# Patient Record
Sex: Female | Born: 1985 | Race: White | Hispanic: No | Marital: Married | Smoking: Never smoker
Health system: Southern US, Community
[De-identification: ages and names within clinical notes are randomized; demographics above are authoritative.]

## PROBLEM LIST (undated history)

## (undated) ENCOUNTER — Inpatient Hospital Stay (HOSPITAL_COMMUNITY): Payer: Self-pay

## (undated) DIAGNOSIS — E282 Polycystic ovarian syndrome: Secondary | ICD-10-CM

## (undated) DIAGNOSIS — E669 Obesity, unspecified: Secondary | ICD-10-CM

## (undated) DIAGNOSIS — N939 Abnormal uterine and vaginal bleeding, unspecified: Secondary | ICD-10-CM

## (undated) DIAGNOSIS — N200 Calculus of kidney: Secondary | ICD-10-CM

## (undated) DIAGNOSIS — O24419 Gestational diabetes mellitus in pregnancy, unspecified control: Secondary | ICD-10-CM

## (undated) DIAGNOSIS — O26899 Other specified pregnancy related conditions, unspecified trimester: Principal | ICD-10-CM

## (undated) DIAGNOSIS — Z87442 Personal history of urinary calculi: Secondary | ICD-10-CM

## (undated) DIAGNOSIS — E079 Disorder of thyroid, unspecified: Secondary | ICD-10-CM

## (undated) DIAGNOSIS — R109 Unspecified abdominal pain: Principal | ICD-10-CM

## (undated) DIAGNOSIS — E039 Hypothyroidism, unspecified: Secondary | ICD-10-CM

## (undated) HISTORY — PX: ADENOIDECTOMY: SUR15

## (undated) HISTORY — PX: DILATION AND CURETTAGE OF UTERUS: SHX78

## (undated) HISTORY — PX: MYRINGOTOMY: SUR874

## (undated) HISTORY — PX: TYMPANOSTOMY TUBE PLACEMENT: SHX32

## (undated) HISTORY — PX: WISDOM TOOTH EXTRACTION: SHX21

## (undated) HISTORY — PX: TONSILLECTOMY: SUR1361

---

## 2005-07-26 ENCOUNTER — Emergency Department (HOSPITAL_COMMUNITY): Admission: EM | Admit: 2005-07-26 | Discharge: 2005-07-26 | Payer: Self-pay | Admitting: *Deleted

## 2005-07-28 ENCOUNTER — Emergency Department (HOSPITAL_COMMUNITY): Admission: EM | Admit: 2005-07-28 | Discharge: 2005-07-28 | Payer: Self-pay | Admitting: Emergency Medicine

## 2006-04-24 ENCOUNTER — Emergency Department (HOSPITAL_COMMUNITY): Admission: EM | Admit: 2006-04-24 | Discharge: 2006-04-24 | Payer: Self-pay | Admitting: Emergency Medicine

## 2006-12-02 ENCOUNTER — Emergency Department (HOSPITAL_COMMUNITY): Admission: EM | Admit: 2006-12-02 | Discharge: 2006-12-02 | Payer: Self-pay | Admitting: Emergency Medicine

## 2008-03-26 ENCOUNTER — Emergency Department (HOSPITAL_COMMUNITY): Admission: EM | Admit: 2008-03-26 | Discharge: 2008-03-27 | Payer: Self-pay | Admitting: Emergency Medicine

## 2008-11-12 ENCOUNTER — Emergency Department (HOSPITAL_COMMUNITY): Admission: EM | Admit: 2008-11-12 | Discharge: 2008-11-12 | Payer: Self-pay | Admitting: Emergency Medicine

## 2010-04-01 ENCOUNTER — Emergency Department (HOSPITAL_COMMUNITY)
Admission: EM | Admit: 2010-04-01 | Discharge: 2010-04-01 | Payer: Self-pay | Source: Home / Self Care | Admitting: Family Medicine

## 2010-10-26 ENCOUNTER — Emergency Department (HOSPITAL_BASED_OUTPATIENT_CLINIC_OR_DEPARTMENT_OTHER)
Admission: EM | Admit: 2010-10-26 | Discharge: 2010-10-26 | Disposition: A | Discharge status: Home / Self Care | Payer: Self-pay | Attending: Emergency Medicine | Admitting: Emergency Medicine

## 2010-10-26 ENCOUNTER — Emergency Department (INDEPENDENT_AMBULATORY_CARE_PROVIDER_SITE_OTHER): Payer: Self-pay

## 2010-10-26 DIAGNOSIS — N201 Calculus of ureter: Secondary | ICD-10-CM | POA: Insufficient documentation

## 2010-10-26 DIAGNOSIS — R109 Unspecified abdominal pain: Secondary | ICD-10-CM

## 2010-10-26 LAB — BASIC METABOLIC PANEL
BUN: 19 mg/dL (ref 6–23)
Calcium: 9.7 mg/dL (ref 8.4–10.5)
GFR calc Af Amer: >60 mL/min (ref >60)
GFR calc non Af Amer: >60 mL/min (ref >60)
Glucose, Bld: 102 mg/dL — ABNORMAL HIGH (ref 70–99)
Potassium: 3.9 mEq/L (ref 3.5–5.1)
Sodium: 139 mEq/L (ref 135–145)

## 2010-10-26 LAB — URINALYSIS, ROUTINE W REFLEX MICROSCOPIC
Glucose, UA: NEGATIVE mg/dL
Ketones, ur: 15 mg/dL — AB
Protein, ur: NEGATIVE mg/dL
Urobilinogen, UA: 0.2 mg/dL (ref 0.0–1.0)

## 2010-10-26 LAB — URINE MICROSCOPIC-ADD ON

## 2011-02-10 LAB — RAPID STREP SCREEN (MED CTR MEBANE ONLY): Streptococcus, Group A Screen (Direct): NEGATIVE

## 2011-02-23 LAB — CBC
Hemoglobin: 13.3
MCHC: 34.4
Platelets: 265
RDW: 13.8

## 2011-02-23 LAB — DIFFERENTIAL
Basophils Absolute: 0
Basophils Relative: 0
Eosinophils Relative: 1
Monocytes Absolute: 0.5
Neutro Abs: 3.6

## 2011-02-23 LAB — URINE MICROSCOPIC-ADD ON

## 2011-02-23 LAB — BASIC METABOLIC PANEL
BUN: 10
CO2: 25
Calcium: 9.4
Glucose, Bld: 87
Sodium: 140

## 2011-02-23 LAB — URINALYSIS, ROUTINE W REFLEX MICROSCOPIC
Hgb urine dipstick: NEGATIVE
Nitrite: NEGATIVE
Protein, ur: NEGATIVE
Specific Gravity, Urine: 1.024
Urobilinogen, UA: 1

## 2011-02-23 LAB — PREGNANCY, URINE: Preg Test, Ur: NEGATIVE

## 2011-06-23 ENCOUNTER — Emergency Department (INDEPENDENT_AMBULATORY_CARE_PROVIDER_SITE_OTHER): Payer: Self-pay

## 2011-06-23 ENCOUNTER — Emergency Department (HOSPITAL_BASED_OUTPATIENT_CLINIC_OR_DEPARTMENT_OTHER)
Admission: EM | Admit: 2011-06-23 | Discharge: 2011-06-24 | Disposition: A | Discharge status: Home / Self Care | Payer: Self-pay | Attending: Emergency Medicine | Admitting: Emergency Medicine

## 2011-06-23 ENCOUNTER — Encounter (HOSPITAL_BASED_OUTPATIENT_CLINIC_OR_DEPARTMENT_OTHER): Payer: Self-pay | Admitting: *Deleted

## 2011-06-23 DIAGNOSIS — R209 Unspecified disturbances of skin sensation: Secondary | ICD-10-CM

## 2011-06-23 DIAGNOSIS — M25569 Pain in unspecified knee: Secondary | ICD-10-CM | POA: Insufficient documentation

## 2011-06-23 DIAGNOSIS — E079 Disorder of thyroid, unspecified: Secondary | ICD-10-CM | POA: Insufficient documentation

## 2011-06-23 DIAGNOSIS — M25561 Pain in right knee: Secondary | ICD-10-CM

## 2011-06-23 HISTORY — DX: Disorder of thyroid, unspecified: E07.9

## 2011-06-23 MED ORDER — OXYCODONE-ACETAMINOPHEN 5-325 MG PO TABS
2.0000 | ORAL_TABLET | Freq: Once | ORAL | Status: AC
  Administered 2011-06-23: 2 via ORAL
  Filled 2011-06-23 (×2): qty 1

## 2011-06-23 MED ORDER — IBUPROFEN 600 MG PO TABS: 600.0000 mg | ORAL_TABLET | Freq: Three times a day (TID) | ORAL | Status: AC | PRN

## 2011-06-23 MED ORDER — OXYCODONE-ACETAMINOPHEN 5-325 MG PO TABS: 1.0000 | ORAL_TABLET | ORAL | Status: AC | PRN

## 2011-06-23 NOTE — ED Notes (Signed)
Pt c/o right knee pain and swelling w/o injury

## 2011-06-23 NOTE — ED Provider Notes (Signed)
History     CSN: [id number]  Arrival date & time 06/23/11  2205   First MD Initiated Contact with Patient 06/23/11 2331      Chief Complaint  Patient presents with  . Knee Pain    The history is provided by the patient.   patient reports several days of constant right knee pain.  She's had no obvious injury to this area.  She does not remember blunt trauma or twisting injury.  She does report that she consumes large amounts of meat however she has never had gout before.  She's never had swelling in her first MTP joint.  She does report that she occasionally gets pain and swelling in her right elbow that resolves with time.  She does report mild pain in the right elbow right now as well however her main concern is her right knee.  She's had not had fever or chills.  She reports pain with range of motion of her right knee.  She denies unilateral leg swelling.  She reports that she feels that her right knee is mildly swollen although this is not appreciable on exam.  Nothing improves her symptoms.  She has tried anti-inflammatories.  Her pain is moderate in severity.  Past Medical History  Diagnosis Date  . Thyroid disease     History reviewed. No pertinent past surgical history.  History reviewed. No pertinent family history.  History  Substance Use Topics  . Smoking status: Never Smoker   . Smokeless tobacco: Not on file  . Alcohol Use: No    OB History    Grav Para Term Preterm Abortions TAB SAB Ect Mult Living                  Review of Systems  All other systems reviewed and are negative.    Allergies  Review of patient's allergies indicates no known allergies.  Home Medications   Current Outpatient Rx  Name Route Sig Dispense Refill  . IBUPROFEN 200 MG PO TABS Oral Take 800 mg by mouth every 6 (six) hours as needed. For pain    . LEVOTHYROXINE SODIUM 125 MCG PO TABS Oral Take 125 mcg by mouth daily.    . IBUPROFEN 600 MG PO TABS Oral Take 1 tablet (600 mg total)  by mouth every 8 (eight) hours as needed for pain. 15 tablet 0  . OXYCODONE-ACETAMINOPHEN 5-325 MG PO TABS Oral Take 1 tablet by mouth every 4 (four) hours as needed for pain. 20 tablet 0    BP 125/84  Pulse 87  Temp(Src) 97.7 F (36.5 C) (Oral)  Resp 16  Ht 5\' 7"  (1.702 m)  Wt 280 lb (127.007 kg)  BMI 43.85 kg/m2  SpO2 100%  LMP 06/15/2011  Physical Exam  Constitutional: She is oriented to person, place, and time. She appears well-developed and well-nourished.  HENT:  Head: Normocephalic.  Eyes: EOM are normal.  Neck: Normal range of motion.  Pulmonary/Chest: Effort normal.  Musculoskeletal: Normal range of motion.       No notable effusion noted in the right knee.  She does have mild pain with range of motion of her right knee however unable to fully range her knee.  Has normal pulses in her right foot.  There is no obvious sensory deficit.  There is no erythema or warmth of her right knee.  Neurological: She is alert and oriented to person, place, and time.  Psychiatric: She has a normal mood and affect.  ED Course  Procedures (including critical care time)  Labs Reviewed - No data to display Dg Knee Complete 4 Views Right  06/23/2011  *RADIOLOGY REPORT*  Clinical Data: Right knee pain for a week.  Now worsening. Numbness in the foot.  No known injury.  RIGHT KNEE - COMPLETE 4+ VIEW  Comparison: None.  Findings: The right knee appears intact.  No evidence of acute fracture or subluxation.  No focal bone lesion or bone destruction. Bone cortex and trabecular architecture appear intact.  No abnormal periosteal reaction.  No significant effusion.  No radiopaque foreign bodies in the soft tissues.  IMPRESSION: No acute bony abnormalities demonstrated.  Original Report Authenticated By: Marlon Pel, M.D.   I personally reviewed the x-ray  1. Pain in right knee       MDM  I suspect this patient's symptoms are secondary to gout.  She has pain in her right knee as well  as her right elbow.  There's no obvious effusion.  I doubt this is a septic arthritis.  Her x-ray is normal.  Her vital signs are normal.  She is able to arrange her right knee.  We discussed at length dietary changes including limiting her meat intake.  Her pain was treated in the ER.  She'll be sent home on a course of pain medicine anti-inflammatories.  She's been given followup with her orthopedic surgeon as well as her primary care Dr.        Lyanne Co, MD 06/24/11 [phone]

## 2011-06-23 NOTE — ED Notes (Signed)
rx x 1 for percocet given to pt- pt informed that ibuprofen has been e-prescribed- d/c home with ride

## 2011-07-16 ENCOUNTER — Ambulatory Visit (INDEPENDENT_AMBULATORY_CARE_PROVIDER_SITE_OTHER): Payer: Self-pay | Admitting: Obstetrics and Gynecology

## 2011-07-16 DIAGNOSIS — Z01419 Encounter for gynecological examination (general) (routine) without abnormal findings: Secondary | ICD-10-CM

## 2011-07-31 ENCOUNTER — Ambulatory Visit: Payer: Self-pay | Admitting: Obstetrics and Gynecology

## 2011-08-13 ENCOUNTER — Other Ambulatory Visit: Payer: Self-pay

## 2011-08-24 ENCOUNTER — Telehealth: Payer: Self-pay | Admitting: Obstetrics and Gynecology

## 2011-08-24 ENCOUNTER — Other Ambulatory Visit: Payer: Self-pay | Admitting: Obstetrics and Gynecology

## 2011-08-24 DIAGNOSIS — E039 Hypothyroidism, unspecified: Secondary | ICD-10-CM

## 2011-08-24 MED ORDER — LEVOTHYROXINE SODIUM 137 MCG PO TABS: 137.0000 ug | ORAL_TABLET | Freq: Every day | ORAL | Status: DC

## 2011-08-24 NOTE — Telephone Encounter (Signed)
Pt had many concerns regarding medications new dose of synthroid was not correct so pt has been taking old dose of 0.125mg  advised pt I would clarify dose with SR, pt is also taking metformin she is currently on 1000mg  needs to be on 2000mg  she is gradually adding more due to the side effects  Will now add 500mg  soon discussed, colpo was needed due to abn pap results she stated she did not want colpo advised I will discuss with SR.Pt did not see Dr Talmage Nap due to upfront payment of $500 to $800 After talking with SR synthroid 0.137 daily times 4 wks and recheck bld work TSH T3 and T4 she does rec the colpo since it had of possible high cell change pt agreeable she  wants to check on pricing before she schedules appt.office will seach for another endocrinologist pt agrees wih plan

## 2011-09-07 ENCOUNTER — Telehealth: Payer: Self-pay | Admitting: Obstetrics and Gynecology

## 2011-09-08 NOTE — Telephone Encounter (Signed)
Routed to laura/aka "Gayla Doss"

## 2011-09-16 ENCOUNTER — Telehealth: Payer: Self-pay | Admitting: Obstetrics and Gynecology

## 2011-09-16 NOTE — Telephone Encounter (Signed)
Pt calling for Clomid refill.  Appt?  ld

## 2011-09-16 NOTE — Telephone Encounter (Signed)
SR pt/Joan Becker

## 2011-09-17 ENCOUNTER — Telehealth: Payer: Self-pay

## 2011-09-17 NOTE — Telephone Encounter (Signed)
Unable to LM. VM not set up.  Pt to have TSH done and the f/u on D 1-2-3 per SR.  ld

## 2011-09-17 NOTE — Telephone Encounter (Signed)
Need more info or appointment

## 2011-09-17 NOTE — Telephone Encounter (Signed)
Pt states that SR would start her on Clomid as soon as she had been on Synthroid for 1 month.  Pt states she has been on med for 1 month now and would like to start Clomid.    Rx or appt?

## 2011-09-18 ENCOUNTER — Ambulatory Visit: Payer: Self-pay | Admitting: Obstetrics and Gynecology

## 2011-09-18 DIAGNOSIS — E039 Hypothyroidism, unspecified: Secondary | ICD-10-CM

## 2011-09-18 LAB — TSH: TSH: 2.869 u[IU]/mL (ref 0.350–4.500)

## 2011-09-23 ENCOUNTER — Encounter: Payer: Self-pay | Admitting: Obstetrics and Gynecology

## 2011-09-23 ENCOUNTER — Ambulatory Visit (INDEPENDENT_AMBULATORY_CARE_PROVIDER_SITE_OTHER): Payer: Self-pay | Admitting: Obstetrics and Gynecology

## 2011-09-23 VITALS — BP 122/68 | Temp 97.9°F | Ht 68.0 in | Wt 293.0 lb

## 2011-09-23 DIAGNOSIS — E079 Disorder of thyroid, unspecified: Secondary | ICD-10-CM

## 2011-09-23 DIAGNOSIS — N970 Female infertility associated with anovulation: Secondary | ICD-10-CM

## 2011-09-23 DIAGNOSIS — E039 Hypothyroidism, unspecified: Secondary | ICD-10-CM | POA: Insufficient documentation

## 2011-09-23 MED ORDER — CLOMIPHENE CITRATE 50 MG PO TABS: 50.0000 mg | ORAL_TABLET | Freq: Every day | ORAL | Status: AC

## 2011-09-23 NOTE — Patient Instructions (Addendum)
Patient needs to be scheduled for a day 12 follicle study ultrasound with follow up with Dr. Estanislado Pandy or another M.D.  Go to [facility] on October 11, 2011 for a day 21 Progesterone lab test only

## 2011-09-23 NOTE — Progress Notes (Signed)
26 YO with history of anovulatory cycles presents for ovary check.  Placed on Synthroid 137 mcg to regulate thyroid.  TSH on 09/18/11 = 2.68.  Menses is regular with flow x  7 days with  pad change every 2 hours-no cramps.  Patient is taking Metformin 500 mg bid and has a previous history of not being ovulatory on Clomid 150 mg.   O: Abdomen: soft, non-tender      Pelvic: EGBUS-wnl, vagina- moderate blood, cervix-no      lesions, uterus-appears normal size, exam limited by     habitus, adnexae-no masses or tenderness  A: Anovulatory Cycles     Ovary Check     Thyroid Disease  P: Dr. Estanislado Pandy consulted: Schedule day 12 ultrasound for           follicle study           Day 21 Progesterone at [facility] 10/11/11           Clomid 50 mg  #20  4 po daily day 3-5 no refills

## 2011-10-02 ENCOUNTER — Ambulatory Visit (INDEPENDENT_AMBULATORY_CARE_PROVIDER_SITE_OTHER): Payer: Self-pay

## 2011-10-02 ENCOUNTER — Ambulatory Visit (INDEPENDENT_AMBULATORY_CARE_PROVIDER_SITE_OTHER): Payer: Self-pay | Admitting: Obstetrics and Gynecology

## 2011-10-02 ENCOUNTER — Other Ambulatory Visit: Payer: Self-pay | Admitting: Obstetrics and Gynecology

## 2011-10-02 ENCOUNTER — Encounter: Payer: Self-pay | Admitting: Obstetrics and Gynecology

## 2011-10-02 DIAGNOSIS — N979 Female infertility, unspecified: Secondary | ICD-10-CM

## 2011-10-02 DIAGNOSIS — E282 Polycystic ovarian syndrome: Secondary | ICD-10-CM

## 2011-10-02 DIAGNOSIS — E039 Hypothyroidism, unspecified: Secondary | ICD-10-CM

## 2011-10-02 DIAGNOSIS — N970 Female infertility associated with anovulation: Secondary | ICD-10-CM

## 2011-10-02 MED ORDER — LEVOTHYROXINE SODIUM 137 MCG PO TABS: 137.0000 ug | ORAL_TABLET | Freq: Every day | ORAL | Status: DC

## 2011-10-02 NOTE — Progress Notes (Signed)
PCOS on Clomid 200 mg now on D12 Follicle study: no dominant follicle   Reviewed with patient. Repeat ultrasound Tuesday 10/06/11 Pt requesting refill on Syntroid

## 2011-10-06 ENCOUNTER — Other Ambulatory Visit: Payer: Self-pay

## 2011-10-06 ENCOUNTER — Encounter: Payer: Self-pay | Admitting: Obstetrics and Gynecology

## 2011-10-08 ENCOUNTER — Other Ambulatory Visit: Payer: Self-pay | Admitting: Obstetrics and Gynecology

## 2011-10-08 DIAGNOSIS — E039 Hypothyroidism, unspecified: Secondary | ICD-10-CM

## 2011-10-08 MED ORDER — LEVOTHYROXINE SODIUM 137 MCG PO TABS: 137.0000 ug | ORAL_TABLET | Freq: Every day | ORAL | Status: DC

## 2011-10-08 NOTE — Telephone Encounter (Signed)
Laura/epic °

## 2011-10-08 NOTE — Telephone Encounter (Signed)
Pt states rx refill was not received at [facility].  Resent to [facility] [address].  Pt to call if not received again.  ld

## 2012-02-29 ENCOUNTER — Telehealth: Payer: Self-pay | Admitting: Obstetrics and Gynecology

## 2012-02-29 NOTE — Telephone Encounter (Signed)
Pt c/o very large clots.  Mostly golfball size, some softball size.  appt made with SR.  ld

## 2012-02-29 NOTE — Telephone Encounter (Signed)
sr pt 

## 2012-03-02 ENCOUNTER — Ambulatory Visit (INDEPENDENT_AMBULATORY_CARE_PROVIDER_SITE_OTHER): Payer: Self-pay | Admitting: Obstetrics and Gynecology

## 2012-03-02 DIAGNOSIS — Z331 Pregnant state, incidental: Secondary | ICD-10-CM

## 2012-03-07 NOTE — Progress Notes (Signed)
Pt DNKA

## 2012-04-04 ENCOUNTER — Encounter (HOSPITAL_COMMUNITY): Payer: Self-pay | Admitting: *Deleted

## 2012-04-04 ENCOUNTER — Inpatient Hospital Stay (HOSPITAL_COMMUNITY)
Admission: AD | Admit: 2012-04-04 | Discharge: 2012-04-04 | Disposition: A | Discharge status: Home / Self Care | Payer: Self-pay | Source: Ambulatory Visit | Attending: Obstetrics & Gynecology | Admitting: Obstetrics & Gynecology

## 2012-04-04 ENCOUNTER — Inpatient Hospital Stay (HOSPITAL_COMMUNITY): Payer: Self-pay

## 2012-04-04 DIAGNOSIS — D649 Anemia, unspecified: Secondary | ICD-10-CM | POA: Insufficient documentation

## 2012-04-04 DIAGNOSIS — R109 Unspecified abdominal pain: Secondary | ICD-10-CM | POA: Insufficient documentation

## 2012-04-04 DIAGNOSIS — N949 Unspecified condition associated with female genital organs and menstrual cycle: Secondary | ICD-10-CM | POA: Insufficient documentation

## 2012-04-04 DIAGNOSIS — N938 Other specified abnormal uterine and vaginal bleeding: Secondary | ICD-10-CM | POA: Insufficient documentation

## 2012-04-04 HISTORY — DX: Obesity, unspecified: E66.9

## 2012-04-04 HISTORY — DX: Hypothyroidism, unspecified: E03.9

## 2012-04-04 LAB — CBC
Platelets: 296 10*3/uL (ref 150–400)
RBC: 4 MIL/uL (ref 3.87–5.11)
RDW: 16.1 % — ABNORMAL HIGH (ref 11.5–15.5)
WBC: 6.5 10*3/uL (ref 4.0–10.5)

## 2012-04-04 LAB — WET PREP, GENITAL
Clue Cells Wet Prep HPF POC: NONE SEEN
WBC, Wet Prep HPF POC: NONE SEEN
Yeast Wet Prep HPF POC: NONE SEEN

## 2012-04-04 MED ORDER — IBUPROFEN 800 MG PO TABS: 800.0000 mg | ORAL_TABLET | Freq: Three times a day (TID) | ORAL | Status: DC | PRN

## 2012-04-04 MED ORDER — FERROUS SULFATE 325 (65 FE) MG PO TABS: 325.0000 mg | ORAL_TABLET | Freq: Three times a day (TID) | ORAL | Status: DC

## 2012-04-04 MED ORDER — MEDROXYPROGESTERONE ACETATE 10 MG PO TABS: 10.0000 mg | ORAL_TABLET | Freq: Every day | ORAL | Status: DC

## 2012-04-04 MED ORDER — KETOROLAC TROMETHAMINE 60 MG/2ML IM SOLN
30.0000 mg | Freq: Once | INTRAMUSCULAR | Status: AC
  Administered 2012-04-04: 30 mg via INTRAMUSCULAR
  Filled 2012-04-04: qty 2

## 2012-04-04 NOTE — MAU Note (Signed)
Pt states she started her period last Wednesday, became heavy on Saturday, passing large clots.  Still bleeding heavily now, has been passing clots in the floor.  LLQ pain since last night.

## 2012-04-04 NOTE — MAU Provider Note (Signed)
Attestation of Attending Supervision of Advanced Practitioner (CNM/NP): Evaluation and management procedures were performed by the Advanced Practitioner under my supervision and collaboration.  I have reviewed the Advanced Practitioner's note and chart, and I agree with the management and plan.  Dodd Schmid, MD, FACOG Attending Obstetrician & Gynecologist Faculty Practice, [facility]  

## 2012-04-04 NOTE — MAU Provider Note (Signed)
History     CSN: [id number]  Arrival date and time: 04/04/12 4098   None     Chief Complaint  Patient presents with  . Vaginal Bleeding  . Abdominal Pain   HPI 26 y.o. G0P0 with heavy bleeding since Saturday night, passing baseball sized clots, lower left side pain since 11 PM last night. Patient's last menstrual period was 03/30/2012. Periods usually heavy/painful, but this is worse. Pain improves with Motrin. Last period was 3 weeks long with smaller clots, no pain. H/O PCOS and Hypothyroid. Was using Clomid for ovulation induction last year, stopped and hasn't been on anything to control menstrual cycles since. Thyroid last checked in January.   Past Medical History  Diagnosis Date  . Thyroid disease   . Hypothyroidism   . Obesity     Past Surgical History  Procedure Date  . Tonsillectomy   . Adenoidectomy   . Wisdom tooth extraction     History reviewed. No pertinent family history.  History  Substance Use Topics  . Smoking status: Never Smoker   . Smokeless tobacco: Never Used  . Alcohol Use: No    Allergies: No Known Allergies  Prescriptions prior to admission  Medication Sig Dispense Refill  . ibuprofen (ADVIL,MOTRIN) 200 MG tablet Take 800 mg by mouth every 6 (six) hours as needed. For pain      . levothyroxine (SYNTHROID, LEVOTHROID) 137 MCG tablet Take 1 tablet (137 mcg total) by mouth daily.  30 tablet  11  . metFORMIN (GLUCOPHAGE) 500 MG tablet Take 500 mg by mouth 2 (two) times daily with a meal.        Review of Systems  Respiratory: Negative.   Cardiovascular: Negative.   Gastrointestinal: Positive for abdominal pain. Negative for nausea, vomiting, diarrhea and constipation.  Genitourinary: Negative for dysuria, urgency, frequency, hematuria and flank pain.       Positive for vaginal bleeding  Musculoskeletal: Negative.   Neurological: Positive for weakness.  Psychiatric/Behavioral: Negative.    Physical Exam   Blood pressure 118/66, pulse  90, temperature 98.2 F (36.8 C), temperature source Oral, resp. rate 18, height 5\' 8"  (1.727 m), weight 308 lb 6.4 oz (139.889 kg), last menstrual period 03/30/2012.  Physical Exam  Nursing note and vitals reviewed. Constitutional: She is oriented to person, place, and time. She appears well-developed and well-nourished. She appears distressed (uncomfortable appearing).       Obese   HENT:  Head: Normocephalic and atraumatic.  Cardiovascular: Normal rate, regular rhythm and normal heart sounds.   Respiratory: Effort normal and breath sounds normal. No respiratory distress.  GI: Soft. Bowel sounds are normal. She exhibits no distension and no mass. There is no tenderness. There is no rebound and no guarding.  Genitourinary: There is no rash or lesion on the right labia. There is no rash or lesion on the left labia. Uterus is not deviated, not enlarged, not fixed and not tender. Cervix exhibits no motion tenderness, no discharge and no friability. Right adnexum displays no mass, no tenderness and no fullness. Left adnexum displays no mass, no tenderness and no fullness. There is bleeding (moderate, large blood on pad and on legs) around the vagina. No erythema or tenderness around the vagina. No vaginal discharge found.  Musculoskeletal: Normal range of motion.  Neurological: She is alert and oriented to person, place, and time. No cranial nerve deficit.  Skin: Skin is warm and dry.  Psychiatric: She has a normal mood and affect.    MAU Course  Procedures Results for orders placed during the hospital encounter of 04/04/12 (from the past 24 hour(s))  POCT PREGNANCY, URINE     Status: Normal   Collection Time   04/04/12  8:08 AM      Component Value Range   Preg Test, Ur NEGATIVE  NEGATIVE  CBC     Status: Abnormal   Collection Time   04/04/12  8:11 AM      Component Value Range   WBC 6.5  4.0 - 10.5 K/uL   RBC 4.00  3.87 - 5.11 MIL/uL   Hemoglobin 8.5 (*) 12.0 - 15.0 g/dL   HCT 16.1  (*) 09.6 - 46.0 %   MCV 71.5 (*) 78.0 - 100.0 fL   MCH 21.3 (*) 26.0 - 34.0 pg   MCHC 29.7 (*) 30.0 - 36.0 g/dL   RDW 04.5 (*) 40.9 - 81.1 %   Platelets 296  150 - 400 K/uL  WET PREP, GENITAL     Status: Normal   Collection Time   04/04/12  8:47 AM      Component Value Range   Yeast Wet Prep HPF POC NONE SEEN  NONE SEEN   Trich, Wet Prep NONE SEEN  NONE SEEN   Clue Cells Wet Prep HPF POC NONE SEEN  NONE SEEN   WBC, Wet Prep HPF POC NONE SEEN  NONE SEEN   US Transvaginal Non-ob  04/04/2012  *RADIOLOGY REPORT*  Clinical Data: Pelvic pain and heavy bleeding.  Patient is 26 years old.  TRANSABDOMINAL AND TRANSVAGINAL ULTRASOUND OF PELVIS Technique:  Both transabdominal and transvaginal ultrasound examinations of the pelvis were performed. Transabdominal technique was performed for global imaging of the pelvis including uterus, ovaries, adnexal regions, and pelvic cul-de-sac.  It was necessary to proceed with endovaginal exam following the transabdominal exam to visualize the .  Comparison:  CT abdomen pelvis 10/26/2010  Findings:  Uterus: Measures 9 x 5.6 x 6 cm and is anteverted.  Uterine echotexture is heterogeneous.  No focal uterine mass is identified. The junctional zone between the endometrium and myometrium is indistinct.  Endometrium: Heterogeneous and thickened.  Endometrial thickness is approximately 2.4 cm.  Right ovary:  Normal appearance/no adnexal mass.  Measures 3 x 2.8 x 3.2 cm.  Left ovary: Normal appearance/no adnexal mass.  Measures 3.3 x 3.2 x 1.8 cm.  Other findings: A small amount of free pelvic fluid is identified.  IMPRESSION:  Thickened endometrium. Measures 2.4 cm.  Per EPIC, the patient has a history of an ovulatory cycles and PCOS.  Question if endometrial thickening could be secondary to anovulatory cycles.  The possibility of endometrial polyp, endometrial hyperplasia, or less likely endometrial carcinoma, given the patient's age, cannot be excluded by imaging.  Consider  short-term follow-up ultrasound immediately following the patient's menses.  Sonohysterogram may be useful if the endometrial thickening persists.   Original Report Authenticated By: Britta Mccreedy, M.D.    US Pelvis Complete  04/04/2012  *RADIOLOGY REPORT*  Clinical Data: Pelvic pain and heavy bleeding.  Patient is 26 years old.  TRANSABDOMINAL AND TRANSVAGINAL ULTRASOUND OF PELVIS Technique:  Both transabdominal and transvaginal ultrasound examinations of the pelvis were performed. Transabdominal technique was performed for global imaging of the pelvis including uterus, ovaries, adnexal regions, and pelvic cul-de-sac.  It was necessary to proceed with endovaginal exam following the transabdominal exam to visualize the .  Comparison:  CT abdomen pelvis 10/26/2010  Findings:  Uterus: Measures 9 x 5.6 x 6 cm and is anteverted.  Uterine  echotexture is heterogeneous.  No focal uterine mass is identified. The junctional zone between the endometrium and myometrium is indistinct.  Endometrium: Heterogeneous and thickened.  Endometrial thickness is approximately 2.4 cm.  Right ovary:  Normal appearance/no adnexal mass.  Measures 3 x 2.8 x 3.2 cm.  Left ovary: Normal appearance/no adnexal mass.  Measures 3.3 x 3.2 x 1.8 cm.  Other findings: A small amount of free pelvic fluid is identified.  IMPRESSION:  Thickened endometrium. Measures 2.4 cm.  Per EPIC, the patient has a history of an ovulatory cycles and PCOS.  Question if endometrial thickening could be secondary to anovulatory cycles.  The possibility of endometrial polyp, endometrial hyperplasia, or less likely endometrial carcinoma, given the patient's age, cannot be excluded by imaging.  Consider short-term follow-up ultrasound immediately following the patient's menses.  Sonohysterogram may be useful if the endometrial thickening persists.   Original Report Authenticated By: Britta Mccreedy, M.D.     Assessment and Plan   1. DUB (dysfunctional uterine bleeding)    2. Anemia       Medication List     As of 04/04/2012 11:43 AM    START taking these medications         ferrous sulfate 325 (65 FE) MG tablet   Take 1 tablet (325 mg total) by mouth 3 (three) times daily with meals.      * ibuprofen 800 MG tablet   Commonly known as: ADVIL,MOTRIN   Take 1 tablet (800 mg total) by mouth every 8 (eight) hours as needed for pain.      medroxyPROGESTERone 10 MG tablet   Commonly known as: PROVERA   Take 1 tablet (10 mg total) by mouth daily.     * Notice: This list has 1 medication(s) that are the same as other medications prescribed for you. Read the directions carefully, and ask your doctor or other care provider to review them with you.    CONTINUE taking these medications         * ibuprofen 200 MG tablet   Commonly known as: ADVIL,MOTRIN      levothyroxine 137 MCG tablet   Commonly known as: SYNTHROID, LEVOTHROID   Take 1 tablet (137 mcg total) by mouth daily.      metFORMIN 500 MG tablet   Commonly known as: GLUCOPHAGE     * Notice: This list has 1 medication(s) that are the same as other medications prescribed for you. Read the directions carefully, and ask your doctor or other care provider to review them with you.        Where to get your medications    These are the prescriptions that you need to pick up. We sent them to a specific pharmacy, so you will need to go there to get them.   [id number] - [place], [place] - [address]    [address] [place] [address]    Phone: [phone]        ferrous sulfate 325 (65 FE) MG tablet   ibuprofen 800 MG tablet   medroxyPROGESTERone 10 MG tablet            Follow-up Information    Follow up with [facility]. (someone will call to schedule appointment)    Contact information:   [address] [place] [address] [phone]           Lavon Bothwell 04/04/2012, 8:20 AM

## 2012-04-11 ENCOUNTER — Other Ambulatory Visit: Payer: Self-pay | Admitting: Advanced Practice Midwife

## 2012-04-11 ENCOUNTER — Telehealth: Payer: Self-pay | Admitting: *Deleted

## 2012-04-11 DIAGNOSIS — E039 Hypothyroidism, unspecified: Secondary | ICD-10-CM

## 2012-04-11 MED ORDER — LEVOTHYROXINE SODIUM 175 MCG PO TABS: 175.0000 ug | ORAL_TABLET | Freq: Every day | ORAL | Status: DC

## 2012-04-11 NOTE — Progress Notes (Signed)
TSH elevated to 9.7, pt currently taking Synthroid 137 mcg, will increase to 175 mcg daily. Has f/u appointment in GYN clinic this month.

## 2012-04-11 NOTE — Telephone Encounter (Signed)
Pt left message requesting thyroid test results.  I returned pt's call and informed her of abnormal TSH result. She states that [name] CNM was going to increase the dose of Levothyroxine if the test was abnormal. I stated that I will contact her or a partner and then call her back. Pt voiced understanding.

## 2012-04-13 NOTE — Telephone Encounter (Signed)
Patient left message stating she was wondering if we called in a new Rx for synthroid and to call her back at [phone]. Called patient back and informed her I received her message asking about a Rx for synthroid and that the medicine had been called in to her [facility] pharmacy on [address] and is available for her to pick up. Patient verbalized understanding and had no further questions

## 2012-05-06 ENCOUNTER — Ambulatory Visit (INDEPENDENT_AMBULATORY_CARE_PROVIDER_SITE_OTHER): Payer: Self-pay | Admitting: Obstetrics & Gynecology

## 2012-05-06 ENCOUNTER — Encounter: Payer: Self-pay | Admitting: Obstetrics & Gynecology

## 2012-05-06 VITALS — BP 118/79 | HR 81 | Temp 97.0°F | Ht 67.0 in | Wt 304.0 lb

## 2012-05-06 DIAGNOSIS — E282 Polycystic ovarian syndrome: Secondary | ICD-10-CM

## 2012-05-06 MED ORDER — NORGESTIM-ETH ESTRAD TRIPHASIC 0.18/0.215/0.25 MG-25 MCG PO TABS: 1.0000 | ORAL_TABLET | Freq: Every day | ORAL | Status: DC

## 2012-05-06 NOTE — Progress Notes (Signed)
Patient ID: Joan Becker, female   DOB: Sep 20, 1985, 26 y.o.   MRN: [id number]  Chief Complaint  Patient presents with  . Menstrual Problem    DUB    HPI Joan Becker is a 26 y.o. female.  Persistent vaginal bleeding since 11/13, was seen in MAU 11/28 and is taking Provera. Only spotting now. 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 Lost insurance and did not go back. HPI  Past Medical History  Diagnosis Date  . Thyroid disease   . Hypothyroidism   . Obesity     Past Surgical History  Procedure Date  . Tonsillectomy   . Adenoidectomy   . Wisdom tooth extraction     Family History  Problem Relation Age of Onset  . Hearing loss Maternal Aunt   . Hearing loss Maternal Uncle   . Hearing loss Paternal Aunt   . Hearing loss Paternal Uncle   . Hearing loss Maternal Grandmother   . Hearing loss Maternal Grandfather   . Hearing loss Paternal Grandmother   . Hearing loss Paternal Grandfather     Social History History  Substance Use Topics  . Smoking status: Never Smoker   . Smokeless tobacco: Never Used  . Alcohol Use: No    No Known Allergies  Current Outpatient Prescriptions  Medication Sig Dispense Refill  . ferrous sulfate 325 (65 FE) MG tablet Take 1 tablet (325 mg total) by mouth 3 (three) times daily with meals.  90 tablet  11  . ibuprofen (ADVIL,MOTRIN) 200 MG tablet Take 800 mg by mouth every 6 (six) hours as needed. For pain      . ibuprofen (ADVIL,MOTRIN) 800 MG tablet Take 1 tablet (800 mg total) by mouth every 8 (eight) hours as needed for pain.  30 tablet  0  . levothyroxine (SYNTHROID, LEVOTHROID) 175 MCG tablet Take 1 tablet (175 mcg total) by mouth daily.  30 tablet  11  . medroxyPROGESTERone (PROVERA) 10 MG tablet Take 1 tablet (10 mg total) by mouth daily.  30 tablet  1  . metFORMIN (GLUCOPHAGE) 500 MG tablet Take 500 mg by mouth 2 (two) times daily with a meal.      . Norgestimate-Ethinyl  Estradiol Triphasic 0.18/0.215/0.25 MG-25 MCG tab Take 1 tablet by mouth daily.  1 Package  11    Review of Systems Review of Systems  Constitutional: Negative for activity change.  Respiratory: Negative for shortness of breath.   Genitourinary: Positive for vaginal bleeding and menstrual problem. Negative for vaginal discharge, pelvic pain and dyspareunia.  Neurological: Negative for weakness and light-headedness.    Blood pressure 118/79, pulse 81, temperature 97 F (36.1 C), temperature source Oral, height 5\' 7"  (1.702 m), weight 304 lb (137.893 kg), last menstrual period 03/28/2012.  Physical Exam Physical Exam  Nursing note and vitals reviewed. Constitutional: No distress.       Obese  Pulmonary/Chest: Effort normal. No respiratory distress.  Abdominal: She exhibits no distension and no mass. There is no tenderness.  Genitourinary: Vagina normal and uterus normal. No vaginal discharge found.       Small blood, no mass  Skin: Skin is warm and dry. No pallor.  Psychiatric: She has a normal mood and affect. Her behavior is normal.    Data Reviewed CBC    Component Value Date/Time   WBC 6.5 04/04/2012 0811   RBC 4.00 04/04/2012 0811   HGB 8.5* 04/04/2012 0811   HCT  28.6* 04/04/2012 0811   PLT 296 04/04/2012 0811   MCV 71.5* 04/04/2012 0811   MCH 21.3* 04/04/2012 0811   MCHC 29.7* 04/04/2012 0811   RDW 16.1* 04/04/2012 0811   LYMPHSABS 1.8 12/02/2006 1243   MONOABS 0.5 12/02/2006 1243   EOSABS 0.1 12/02/2006 1243   BASOSABS 0.0 12/02/2006 1243      Assessment    PCOS, anemia, DUB    Plan    Tri-Sprintec RTC 3 mo Consider infertility eval, semen analysis         ARNOLD,JAMES 05/06/2012, 9:05 AM

## 2012-05-06 NOTE — Patient Instructions (Signed)
Oral Contraception Information Oral contraceptives (OCs) are medicines taken to prevent pregnancy. OCs work by preventing the ovaries from releasing eggs. The hormones in OCs also cause the cervical mucus to thicken, preventing the sperm from entering the uterus. The hormones also cause the uterine lining to become thin, not allowing a fertilized egg to attach to the inside of the uterus. OCs are highly effective when taken exactly as prescribed. However, OCs do not prevent sexually transmitted diseases (STDs). Safe sex practices, such as using condoms along with the pill, can help prevent STDs.  Before taking the pill, you may have a physical exam and Pap test. Your caregiver may order blood tests that may be necessary. Your caregiver will make sure you are a good candidate for oral contraception. Discuss with your caregiver the possible side effects of the OC you may be prescribed. When starting an OC, it can take 2 to 3 months for the body to adjust to the changes in hormone levels in your body.  TYPES OF ORAL CONTRACEPTION  The combination pill. This pill contains estrogen and progestin (synthetic progesterone) hormones. The combination pill comes in either 21-day or 28-day packs. With 21-day packs, you do not take pills for 7 days after the last pill. With 28-day packs, the pill is taken every day. The last 7 pills are without hormones. Certain types of pills have more than 21 hormone-containing pills.  The minipill. This pill contains the progesterone hormone only. It is taken every day continuously. The minipill comes in packs of 91 pills. The first 84 pills contain the hormones, and the last 7 pills do not. The last 7 days are when you will have your menstrual period. You may experience irregular spotting. ADVANTAGES  Decreases premenstrual symptoms.  Treats menstrual period cramps.  Regulates the menstrual cycle.  Decreases a heavy menstrual flow.  Treats acne.  Treats abnormal uterine  bleeding.  Treats chronic pelvic pain.  Treats polycystic ovarian syndrome.  Treats endometriosis.  Can be used as emergency contraception. DISADVANTAGES OCs can be less effective if:  You forget to take the pill at the same time every day.  You have a stomach or intestinal disease that lessens the absorption of the pill.  You take OCs with other medicines that make OCs less effective.  You take expired OCs.  You forget to restart the pill on day 7, when using the packs of 21 pills. Document Released: 07/18/2002 Document Revised: 07/20/2011 Document Reviewed: 09/03/2010 [facility] Patient Information 2013 [place], [place]. Polycystic Ovarian Syndrome Polycystic ovarian syndrome is a condition with a number of problems. One problem is with the ovaries. The ovaries are organs located in the female pelvis, on each side of the uterus. Usually, during the menstrual cycle, an egg is released from 1 ovary every month. This is called ovulation. When the egg is fertilized, it goes into the womb (uterus), which allows for the growth of a baby. The egg travels from the ovary through the fallopian tube to the uterus. The ovaries also make the hormones estrogen and progesterone. These hormones help the development of a woman's breasts, body shape, and body hair. They also regulate the menstrual cycle and pregnancy. Sometimes, cysts form in the ovaries. A cyst is a fluid-filled sac. On the ovary, different types of cysts can form. The most common type of ovarian cyst is called a functional or ovulation cyst. It is normal, and often forms during the normal menstrual cycle. Each month, a woman's ovaries grow tiny cysts  that hold the eggs. When an egg is fully grown, the sac breaks open. This releases the egg. Then, the sac which released the egg from the ovary dissolves. In one type of functional cyst, called a follicle cyst, the sac does not break open to release the egg. It may actually continue to grow.  This type of cyst usually disappears within 1 to 3 months.  One type of cyst problem with the ovaries is called Polycystic Ovarian Syndrome (PCOS). In this condition, many follicle cysts form, but do not rupture and produce an egg. This health problem can affect the following:  Menstrual cycle.  Heart.  Obesity.  Cancer of the uterus.  Fertility.  Blood vessels.  Hair growth (face and body) or baldness.  Hormones.  Appearance.  High blood pressure.  Stroke.  Insulin production.  Inflammation of the liver.  Elevated blood cholesterol and triglycerides. CAUSES   No one knows the exact cause of PCOS.  Women with PCOS often have a mother or sister with PCOS. There is not yet enough proof to say this is inherited.  Many women with PCOS have a weight problem.  Researchers are looking at the relationship between PCOS and the body's ability to make insulin. Insulin is a hormone that regulates the change of sugar, starches, and other food into energy for the body's use, or for storage. Some women with PCOS make too much insulin. It is possible that the ovaries react by making too many female hormones, called androgens. This can lead to acne, excessive hair growth, weight gain, and ovulation problems.  Too much production of luteinizing hormone (LH) from the pituitary gland in the brain stimulates the ovary to produce too much female hormone (androgen). SYMPTOMS   Infrequent or no menstrual periods, and/or irregular bleeding.  Inability to get pregnant (infertility), because of not ovulating.  Increased growth of hair on the face, chest, stomach, back, thumbs, thighs, or toes.  Acne, oily skin, or dandruff.  Pelvic pain.  Weight gain or obesity, usually carrying extra weight around the waist.  Type 2 diabetes (this is the diabetes that usually does not need insulin).  High cholesterol.  High blood pressure.  Female-pattern baldness or thinning hair.  Patches of  thickened and dark brown or black skin on the neck, arms, breasts, or thighs.  Skin tags, or tiny excess flaps of skin, in the armpits or neck area.  Sleep apnea (excessive snoring and breathing stops at times while asleep).  Deepening of the voice.  Gestational diabetes when pregnant.  Increased risk of miscarriage with pregnancy. DIAGNOSIS  There is no single test to diagnose PCOS.   Your caregiver will:  Take a medical history.  Perform a pelvic exam.  Perform an ultrasound.  Check your female and female hormone levels.  Measure glucose or sugar levels in the blood.  Do other blood tests.  If you are producing too many female hormones, your caregiver will make sure it is from PCOS. At the physical exam, your caregiver will want to evaluate the areas of increased hair growth. Try to allow natural hair growth for a few days before the visit.  During a pelvic exam, the ovaries may be enlarged or swollen by the increased number of small cysts. This can be seen more easily by vaginal ultrasound or screening, to examine the ovaries and lining of the uterus (endometrium) for cysts. The uterine lining may become thicker, if there has not been a regular period. TREATMENT  Because there  is no cure for PCOS, it needs to be managed to prevent problems. Treatments are based on your symptoms. Treatment is also based on whether you want to have a baby or whether you need contraception.  Treatment may include:  Progesterone hormone, to start a menstrual period.  Birth control pills, to make you have regular menstrual periods.  Medicines to make you ovulate, if you want to get pregnant.  Medicines to control your insulin.  Medicine to control your blood pressure.  Medicine and diet, to control your high cholesterol and triglycerides in your blood.  Surgery, making small holes in the ovary, to decrease the amount of female hormone production. This is done through a long, lighted tube  (laparoscope), placed into the pelvis through a tiny incision in the lower abdomen. Your caregiver will go over some of the choices with you. WOMEN WITH PCOS HAVE THESE CHARACTERISTICS:  High levels of female hormones called androgens.  An irregular or no menstrual cycle.  May have many small cysts in their ovaries. PCOS is the most common hormonal reproductive problem in women of childbearing age. WHY DO WOMEN WITH PCOS HAVE TROUBLE WITH THEIR MENSTRUAL CYCLE? Each month, about 20 eggs start to mature in the ovaries. As one egg grows and matures, the follicle breaks open to release the egg, so it can travel through the fallopian tube for fertilization. When the single egg leaves the follicle, ovulation takes place. In women with PCOS, the ovary does not make all of the hormones it needs for any of the eggs to fully mature. They may start to grow and accumulate fluid, but no one egg becomes large enough. Instead, some may remain as cysts. Since no egg matures or is released, ovulation does not occur and the hormone progesterone is not made. Without progesterone, a woman's menstrual cycle is irregular or absent. Also, the cysts produce female hormones, which continue to prevent ovulation.  Document Released: 08/21/2004 Document Revised: 07/20/2011 Document Reviewed: 03/15/2009 [facility] Patient Information 2013 [place], [place].

## 2012-05-12 ENCOUNTER — Telehealth: Payer: Self-pay | Admitting: *Deleted

## 2012-05-12 MED ORDER — NORGESTIM-ETH ESTRAD TRIPHASIC 0.18/0.215/0.25 MG-35 MCG PO TABS: 1.0000 | ORAL_TABLET | Freq: Every day | ORAL | Status: DC

## 2012-05-12 NOTE — Telephone Encounter (Signed)
Pt states that she was prescribed birth control that cost $120 and she needs a generic.

## 2012-05-12 NOTE — Telephone Encounter (Signed)
Called pt and pt informed me that BCP that was prescribed is too expensive.  Per Dr. Debroah Loop pt can have Tri-Sprintec.  Informed pt that we would change her BCP to Tri-Sprintec and that she would still follow as to what the provider has instructed her to do concerning taking her medications.  Pt stated understanding and no other questions asked.  I called in the tri-sprintec to the [facility] off [address].

## 2012-05-26 ENCOUNTER — Telehealth: Payer: Self-pay | Admitting: *Deleted

## 2012-05-26 NOTE — Telephone Encounter (Addendum)
Pt left message stating that she was taken off provera and placed on OCP's for bleeding. She has not stopped bleeding and it has been 2 months. She wants to know if there is anything else she can take to stop the bleeding.  I returned pt's call and left a message that I was calling to discuss her concern. I will call back later today or tomorrow morning.  **Note: pt was given Rx for tri-sprintec on 05/12/12- has only been 2 wks. Unless pt is having heavy bleeding, she needs to continue OCP regimen and be informed that the pills have increasing amount of estrogen throughout the pill pack. Pt should not take the last week of pills (placebo) for 2 months, then take the entire pack on month 3 so that she will get a period.

## 2012-05-27 NOTE — Telephone Encounter (Signed)
Called Joan Becker and she states she is still bleeding but not heavy and somedays less than others. We did discuss she just started Tri-Sprintec 05/12/12 and that it has not been long enough to have good effect. Discussed she needs to continue taking the ocp's and that each week is a different dose- discussed omitting placebo first 2 packs only - if after 3rd pack still not improved call clinic back- if heavy bleeding come to [facility]. Patient voices understanding.

## 2012-05-30 ENCOUNTER — Telehealth: Payer: Self-pay | Admitting: *Deleted

## 2012-05-30 NOTE — Telephone Encounter (Signed)
Called [facility] and left a message we are returning a call , you may call back to the clinic and we may be able to call again later today or tomorrow

## 2012-05-30 NOTE — Telephone Encounter (Signed)
Joan Becker called and left a message stating she called last Friday and spoke to someone but states the bleeding is getting worse and wants to know if she can take the bcp and provera to try to get the bleeding stopped. States call [phone] unless after 11;30 then can call [phone]

## 2012-05-31 NOTE — Telephone Encounter (Signed)
Called pt and left message that I am trying to reach her to discuss her concern. Please leave a new message stating when she can be reached.

## 2012-05-31 NOTE — Telephone Encounter (Signed)
Called and left new message for pt after consult w/Dr. Debroah Loop. I stated that he recommends for her to begin the Provera 10mg  daily with her OCP until the bleeding stops and then just take the OCP only. Remember not to take the last week of pill pack (placebos) for the first 2 packs of pills. She may call back if she has questions.

## 2012-06-15 ENCOUNTER — Ambulatory Visit (INDEPENDENT_AMBULATORY_CARE_PROVIDER_SITE_OTHER): Payer: Self-pay | Admitting: Obstetrics & Gynecology

## 2012-06-15 ENCOUNTER — Encounter: Payer: Self-pay | Admitting: Obstetrics & Gynecology

## 2012-06-15 VITALS — BP 129/79 | HR 87 | Temp 97.4°F | Ht 68.0 in | Wt 295.9 lb

## 2012-06-15 DIAGNOSIS — N921 Excessive and frequent menstruation with irregular cycle: Secondary | ICD-10-CM

## 2012-06-15 DIAGNOSIS — N920 Excessive and frequent menstruation with regular cycle: Secondary | ICD-10-CM

## 2012-06-15 MED ORDER — NORETHINDRONE-ETH ESTRADIOL 1-35 MG-MCG PO TABS: ORAL_TABLET | ORAL | Status: DC

## 2012-06-15 NOTE — Progress Notes (Signed)
Subjective:     Patient ID: Joan Becker, female   DOB: Sep 19, 1985, 27 y.o.   MRN: [id number]  HPIPatient's last menstrual period was 03/30/2012. G0P0 Still bleeding despite TriSprintec and provera daily. Mild cramp  Past Medical History  Diagnosis Date  . Thyroid disease   . Hypothyroidism   . Obesity    Past Surgical History  Procedure Date  . Tonsillectomy   . Adenoidectomy   . Wisdom tooth extraction    Current Outpatient Prescriptions on File Prior to Visit  Medication Sig Dispense Refill  . ferrous sulfate 325 (65 FE) MG tablet Take 1 tablet (325 mg total) by mouth 3 (three) times daily with meals.  90 tablet  11  . ibuprofen (ADVIL,MOTRIN) 800 MG tablet Take 1 tablet (800 mg total) by mouth every 8 (eight) hours as needed for pain.  30 tablet  0  . levothyroxine (SYNTHROID, LEVOTHROID) 175 MCG tablet Take 1 tablet (175 mcg total) by mouth daily.  30 tablet  11  . medroxyPROGESTERone (PROVERA) 10 MG tablet Take 1 tablet (10 mg total) by mouth daily.  30 tablet  1  . metFORMIN (GLUCOPHAGE) 500 MG tablet Take 500 mg by mouth 2 (two) times daily with a meal.      . Norgestimate-Ethinyl Estradiol Triphasic 0.18/0.215/0.25 MG-35 MCG tablet Take 1 tablet by mouth daily.  1 Package  11  . norethindrone-ethinyl estradiol 1/35 (ORTHO-NOVUM 1/35, 28,) tablet 2 tablets daily until bleeding stops then 1 daily  2 Package  6   No Known Allergies History   Social History  . Marital Status: Married    Spouse Name: N/A    Number of Children: N/A  . Years of Education: N/A   Occupational History  . Not on file.   Social History Main Topics  . Smoking status: Never Smoker   . Smokeless tobacco: Never Used  . Alcohol Use: No  . Drug Use: No  . Sexually Active: Yes    Birth Control/ Protection: None   Other Topics Concern  . Not on file   Social History Narrative  . No narrative on file      Review of Systems  Constitutional: Negative.   Respiratory: Negative.    Genitourinary: Positive for vaginal bleeding, menstrual problem and pelvic pain.       Objective:   Physical Exam  Constitutional: No distress.       obese  Pulmonary/Chest: Effort normal. No respiratory distress.  Genitourinary:       deferred  Psychiatric: She has a normal mood and affect. Her behavior is normal.       Assessment:     DUB unresponsive to current hormonal management, endometrial thickening     Plan:     Ortho Novum 1/35 BID until bleeding stops then one a day Repeat US CBC RTC 1 month  Bertin Inabinet 06/15/2012 4:01 PM

## 2012-06-15 NOTE — Patient Instructions (Signed)
Dysfunctional Uterine Bleeding  Normally, menstrual periods begin between ages 11 to 17 in young women. A normal menstrual cycle/period may begin every 23 days up to 35 days and lasts from 1 to 7 days. Around 12 to 14 days before your menstrual period starts, ovulation (ovary produces an egg) occurs. When counting the time between menstrual periods, count from the first day of bleeding of the previous period to the first day of bleeding of the next period.  Dysfunctional (abnormal) uterine bleeding is bleeding that is different from a normal menstrual period. Your periods may come earlier or later than usual. They may be lighter, have blood clots or be heavier. You may have bleeding between periods, or you may skip one period or more. You may have bleeding after sexual intercourse, bleeding after menopause, or no menstrual period.  CAUSES   · Pregnancy (normal, miscarriage, tubal).  · IUDs (intrauterine device, birth control).  · Birth control pills.  · Hormone treatment.  · Menopause.  · Infection of the cervix.  · Blood clotting problems.  · Infection of the inside lining of the uterus.  · Endometriosis, inside lining of the uterus growing in the pelvis and other female organs.  · Adhesions (scar tissue) inside the uterus.  · Obesity or severe weight loss.  · Uterine polyps inside the uterus.  · Cancer of the vagina, cervix, or uterus.  · Ovarian cysts or polycystic ovary syndrome.  · Medical problems (diabetes, thyroid disease).  · Uterine fibroids (noncancerous tumor).  · Problems with your female hormones.  · Endometrial hyperplasia, very thick lining and enlarged cells inside of the uterus.  · Medicines that interfere with ovulation.  · Radiation to the pelvis or abdomen.  · Chemotherapy.  DIAGNOSIS   · Your doctor will discuss the history of your menstrual periods, medicines you are taking, changes in your weight, stress in your life, and any medical problems you may have.  · Your doctor will do a physical  and pelvic examination.  · Your doctor may want to perform certain tests to make a diagnosis, such as:  · Pap test.  · Blood tests.  · Cultures for infection.  · CT scan.  · Ultrasound.  · Hysteroscopy.  · Laparoscopy.  · MRI.  · Hysterosalpingography.  · D and C.  · Endometrial biopsy.  TREATMENT   Treatment will depend on the cause of the dysfunctional uterine bleeding (DUB). Treatment may include:  · Observing your menstrual periods for a couple of months.  · Prescribing medicines for medical problems, including:  · Antibiotics.  · Hormones.  · Birth control pills.  · Removing an IUD (intrauterine device, birth control).  · Surgery:  · D and C (scrape and remove tissue from inside the uterus).  · Laparoscopy (examine inside the abdomen with a lighted tube).  · Uterine ablation (destroy lining of the uterus with electrical current, laser, heat, or freezing).  · Hysteroscopy (examine cervix and uterus with a lighted tube).  · Hysterectomy (remove the uterus).  HOME CARE INSTRUCTIONS   · If medicines were prescribed, take exactly as directed. Do not change or switch medicines without consulting your caregiver.  · Long term heavy bleeding may result in iron deficiency. Your caregiver may have prescribed iron pills. They help replace the iron that your body lost from heavy bleeding. Take exactly as directed.  · Do not take aspirin or medicines that contain aspirin one week before or during your menstrual period. Aspirin may make   the bleeding worse.  · If you need to change your sanitary pad or tampon more than once every 2 hours, stay in bed with your feet elevated and a cold pack on your lower abdomen. Rest as much as possible, until the bleeding stops or slows down.  · Eat well-balanced meals. Eat foods high in iron. Examples are:  · Leafy green vegetables.  · Whole-grain breads and cereals.  · Eggs.  · Meat.  · Liver.  · Do not try to lose weight until the abnormal bleeding has stopped and your blood iron level is  back to normal. Do not lift more than ten pounds or do strenuous activities when you are bleeding.  · For a couple of months, make note on your calendar, marking the start and ending of your period, and the type of bleeding (light, medium, heavy, spotting, clots or missed periods). This is for your caregiver to better evaluate your problem.  SEEK MEDICAL CARE IF:   · You develop nausea (feeling sick to your stomach) and vomiting, dizziness, or diarrhea while you are taking your medicine.  · You are getting lightheaded or weak.  · You have any problems that may be related to the medicine you are taking.  · You develop pain with your DUB.  · You want to remove your IUD.  · You want to stop or change your birth control pills or hormones.  · You have any type of abnormal bleeding mentioned above.  · You are over 16 years old and have not had a menstrual period yet.  · You are 27 years old and you are still having menstrual periods.  · You have any of the symptoms mentioned above.  · You develop a rash.  SEEK IMMEDIATE MEDICAL CARE IF:   · An oral temperature above 102° F (38.9° C) develops.  · You develop chills.  · You are changing your sanitary pad or tampon more than once an hour.  · You develop abdominal pain.  · You pass out or faint.  Document Released: 04/24/2000 Document Revised: 07/20/2011 Document Reviewed: 03/26/2009  [employer]® Patient Information ©2013 [employer].

## 2012-06-16 LAB — CBC
HCT: 24.3 % — ABNORMAL LOW (ref 36.0–46.0)
Hemoglobin: 7.2 g/dL — ABNORMAL LOW (ref 12.0–15.0)
MCV: 61.8 fL — ABNORMAL LOW (ref 78.0–100.0)
RDW: 18.8 % — ABNORMAL HIGH (ref 11.5–15.5)
WBC: 7.3 10*3/uL (ref 4.0–10.5)

## 2012-06-21 ENCOUNTER — Ambulatory Visit (HOSPITAL_COMMUNITY)
Admission: RE | Admit: 2012-06-21 | Discharge: 2012-06-21 | Disposition: A | Discharge status: Home / Self Care | Payer: Self-pay | Source: Ambulatory Visit | Attending: Obstetrics & Gynecology | Admitting: Obstetrics & Gynecology

## 2012-06-21 DIAGNOSIS — N921 Excessive and frequent menstruation with irregular cycle: Secondary | ICD-10-CM

## 2012-06-21 DIAGNOSIS — N838 Other noninflammatory disorders of ovary, fallopian tube and broad ligament: Secondary | ICD-10-CM | POA: Insufficient documentation

## 2012-06-21 DIAGNOSIS — N949 Unspecified condition associated with female genital organs and menstrual cycle: Secondary | ICD-10-CM | POA: Insufficient documentation

## 2012-06-21 DIAGNOSIS — N938 Other specified abnormal uterine and vaginal bleeding: Secondary | ICD-10-CM | POA: Insufficient documentation

## 2012-06-21 DIAGNOSIS — E282 Polycystic ovarian syndrome: Secondary | ICD-10-CM | POA: Insufficient documentation

## 2012-06-22 ENCOUNTER — Telehealth: Payer: Self-pay

## 2012-06-22 NOTE — Telephone Encounter (Signed)
Pt called and stated that she has been bleeding since November and needs to know what to do. Called pt and pt informed me what is stated above.  I confirmed with pt that she saw Dr.  Debroah Loop on the 06/15/12 and he wanted her to take Orthro Novum bid until the bleeding stop then once per day.  Pt stated that she picked up Rx on 06/17/12 and that she has been taking it bid.  Pt stated that she is still bleeding.  I advised pt to continue to taking medication as prescribed.  Pt then stated I want to know if I need to have blood since Im losing blood.  I informed pt that the CBC was taken and if the provider felt that there was any need for blood.  Pt asked "why do I have to wait 4 weeks"?  I advised pt that she would need to wait that period to give the regimen that her and the provider spoke about to work.  Pt stated understanding and hung up the phone.

## 2012-07-06 ENCOUNTER — Emergency Department (HOSPITAL_BASED_OUTPATIENT_CLINIC_OR_DEPARTMENT_OTHER): Payer: Self-pay

## 2012-07-06 ENCOUNTER — Encounter (HOSPITAL_BASED_OUTPATIENT_CLINIC_OR_DEPARTMENT_OTHER): Payer: Self-pay | Admitting: *Deleted

## 2012-07-06 ENCOUNTER — Emergency Department (HOSPITAL_BASED_OUTPATIENT_CLINIC_OR_DEPARTMENT_OTHER)
Admission: EM | Admit: 2012-07-06 | Discharge: 2012-07-07 | Disposition: A | Discharge status: Home / Self Care | Payer: Self-pay | Attending: Emergency Medicine | Admitting: Emergency Medicine

## 2012-07-06 DIAGNOSIS — N898 Other specified noninflammatory disorders of vagina: Secondary | ICD-10-CM | POA: Insufficient documentation

## 2012-07-06 DIAGNOSIS — E669 Obesity, unspecified: Secondary | ICD-10-CM | POA: Insufficient documentation

## 2012-07-06 DIAGNOSIS — E039 Hypothyroidism, unspecified: Secondary | ICD-10-CM | POA: Insufficient documentation

## 2012-07-06 DIAGNOSIS — Z3202 Encounter for pregnancy test, result negative: Secondary | ICD-10-CM | POA: Insufficient documentation

## 2012-07-06 DIAGNOSIS — Z87442 Personal history of urinary calculi: Secondary | ICD-10-CM | POA: Insufficient documentation

## 2012-07-06 DIAGNOSIS — Z79899 Other long term (current) drug therapy: Secondary | ICD-10-CM | POA: Insufficient documentation

## 2012-07-06 DIAGNOSIS — R52 Pain, unspecified: Secondary | ICD-10-CM | POA: Insufficient documentation

## 2012-07-06 DIAGNOSIS — R109 Unspecified abdominal pain: Secondary | ICD-10-CM | POA: Insufficient documentation

## 2012-07-06 DIAGNOSIS — R6883 Chills (without fever): Secondary | ICD-10-CM | POA: Insufficient documentation

## 2012-07-06 HISTORY — DX: Calculus of kidney: N20.0

## 2012-07-06 LAB — URINE MICROSCOPIC-ADD ON

## 2012-07-06 LAB — URINALYSIS, ROUTINE W REFLEX MICROSCOPIC
Bilirubin Urine: NEGATIVE
Glucose, UA: NEGATIVE mg/dL
Specific Gravity, Urine: 1.025 (ref 1.005–1.030)
Urobilinogen, UA: 1 mg/dL (ref 0.0–1.0)

## 2012-07-06 LAB — PREGNANCY, URINE: Preg Test, Ur: NEGATIVE

## 2012-07-06 MED ORDER — ONDANSETRON HCL 4 MG/2ML IJ SOLN
4.0000 mg | Freq: Once | INTRAMUSCULAR | Status: AC
  Administered 2012-07-06: 4 mg via INTRAVENOUS
  Filled 2012-07-06: qty 2

## 2012-07-06 MED ORDER — HYDROMORPHONE HCL PF 1 MG/ML IJ SOLN
1.0000 mg | Freq: Once | INTRAMUSCULAR | Status: AC
  Administered 2012-07-06: 1 mg via INTRAVENOUS
  Filled 2012-07-06: qty 1

## 2012-07-06 MED ORDER — SODIUM CHLORIDE 0.9 % IV SOLN
INTRAVENOUS | Status: DC
  Administered 2012-07-06: 23:00:00 via INTRAVENOUS

## 2012-07-06 MED ORDER — KETOROLAC TROMETHAMINE 15 MG/ML IJ SOLN
15.0000 mg | Freq: Once | INTRAMUSCULAR | Status: AC
  Administered 2012-07-06: 15 mg via INTRAVENOUS
  Filled 2012-07-06: qty 1

## 2012-07-06 NOTE — ED Notes (Signed)
MD at bedside. 

## 2012-07-06 NOTE — ED Provider Notes (Signed)
History     CSN: [id number]  Arrival date & time 07/06/12  2117   First MD Initiated Contact with Patient 07/06/12 2311      Chief Complaint  Patient presents with  . Flank Pain    (Consider location/radiation/quality/duration/timing/severity/associated sxs/prior treatment) HPI She has had colicky pain in the LLQ and left flank for the past two days. The pain has worsened and is now severe. It is only slightly worse with movement or palpation. The pain is difficult to characterize, but is not the same as previous kidney stones. There has been no associated nausea or vomiting. She is not sure of hematuria because she is having some vaginal bleeding. She has had some chills but no fever.  Past Medical History  Diagnosis Date  . Thyroid disease   . Hypothyroidism   . Obesity   . Kidney stones     Past Surgical History  Procedure Laterality Date  . Tonsillectomy    . Adenoidectomy    . Wisdom tooth extraction      Family History  Problem Relation Age of Onset  . Hearing loss Maternal Aunt   . Hearing loss Maternal Uncle   . Hearing loss Paternal Aunt   . Hearing loss Paternal Uncle   . Hearing loss Maternal Grandmother   . Hearing loss Maternal Grandfather   . Hearing loss Paternal Grandmother   . Hearing loss Paternal Grandfather     History  Substance Use Topics  . Smoking status: Never Smoker   . Smokeless tobacco: Never Used  . Alcohol Use: No    OB History   Grav Para Term Preterm Abortions TAB SAB Ect Mult Living   0               Review of Systems  All other systems reviewed and are negative.    Allergies  Review of patient's allergies indicates no known allergies.  Home Medications   Current Outpatient Rx  Name  Route  Sig  Dispense  Refill  . ferrous sulfate 325 (65 FE) MG tablet   Oral   Take 1 tablet (325 mg total) by mouth 3 (three) times daily with meals.   90 tablet   11   . ibuprofen (ADVIL,MOTRIN) 800 MG tablet   Oral   Take 1  tablet (800 mg total) by mouth every 8 (eight) hours as needed for pain.   30 tablet   0   . levothyroxine (SYNTHROID, LEVOTHROID) 175 MCG tablet   Oral   Take 1 tablet (175 mcg total) by mouth daily.   30 tablet   11   . medroxyPROGESTERone (PROVERA) 10 MG tablet   Oral   Take 1 tablet (10 mg total) by mouth daily.   30 tablet   1   . metFORMIN (GLUCOPHAGE) 500 MG tablet   Oral   Take 500 mg by mouth 2 (two) times daily with a meal.         . norethindrone-ethinyl estradiol 1/35 (ORTHO-NOVUM 1/35, 28,) tablet      2 tablets daily until bleeding stops then 1 daily   2 Package   6   . Norgestimate-Ethinyl Estradiol Triphasic 0.18/0.215/0.25 MG-35 MCG tablet   Oral   Take 1 tablet by mouth daily.   1 Package   11     BP 164/63  Pulse 94  Temp(Src) 98.2 F (36.8 C) (Oral)  Resp 22  Ht 5\' 8"  (1.727 m)  Wt 295 lb (133.811 kg)  BMI  44.86 kg/m2  SpO2 100%  LMP 03/30/2012  Physical Exam General: Well-developed, obese female in no acute distress; appearance consistent with age of record HENT: normocephalic, atraumatic Eyes: pupils equal round and reactive to light; extraocular muscles intact Neck: supple Heart: regular rate and rhythm Lungs: clear to auscultation bilaterally Abdomen: soft; obese; LLQ tenderness; bowel sounds present GU: mild left CVA tenderness Extremities: No deformity; full range of motion; pulses normal; trace edema of lower legs Neurologic: Awake, alert and oriented; motor function intact in all extremities and symmetric; no facial droop Skin: Warm and dry Psychiatric: Normal mood and affect    ED Course  Procedures (including critical care time)     MDM   Nursing notes and vitals signs, including pulse oximetry, reviewed.  Summary of this visit's results, reviewed by myself:  Labs:  Results for orders placed during the hospital encounter of 07/06/12 (from the past 24 hour(s))  URINALYSIS, ROUTINE W REFLEX MICROSCOPIC      Status: Abnormal   Collection Time    07/06/12  9:25 PM      Result Value Range   Color, Urine YELLOW  YELLOW   APPearance CLEAR  CLEAR   Specific Gravity, Urine 1.025  1.005 - 1.030   pH 6.0  5.0 - 8.0   Glucose, UA NEGATIVE  NEGATIVE mg/dL   Hgb urine dipstick LARGE (*) NEGATIVE   Bilirubin Urine NEGATIVE  NEGATIVE   Ketones, ur NEGATIVE  NEGATIVE mg/dL   Protein, ur NEGATIVE  NEGATIVE mg/dL   Urobilinogen, UA 1.0  0.0 - 1.0 mg/dL   Nitrite NEGATIVE  NEGATIVE   Leukocytes, UA NEGATIVE  NEGATIVE  PREGNANCY, URINE     Status: None   Collection Time    07/06/12  9:25 PM      Result Value Range   Preg Test, Ur NEGATIVE  NEGATIVE  URINE MICROSCOPIC-ADD ON     Status: None   Collection Time    07/06/12  9:25 PM      Result Value Range   Squamous Epithelial / LPF RARE  RARE   WBC, UA 0-2  <3 WBC/hpf   RBC / HPF TOO NUMEROUS TO COUNT  <3 RBC/hpf   Bacteria, UA RARE  RARE   Urine-Other MUCOUS PRESENT      Imaging Studies: Ct Abdomen Pelvis Wo Contrast  07/07/2012  *RADIOLOGY REPORT*  Clinical Data: 27 year old female with left flank, abdominal and pelvic pain.  CT ABDOMEN AND PELVIS WITHOUT CONTRAST  Technique:  Multidetector CT imaging of the abdomen and pelvis was performed following the standard protocol without intravenous contrast.  Comparison: 10/26/2010 CT  Findings: The liver, pancreas, gallbladder, adrenal glands and kidneys are unremarkable except for a punctate nonobstructing mid right renal calculus.  There is no evidence of hydronephrosis or ureteral calculi.  Mild splenomegaly is again noted with a splenic volume of 450 ml.  Please note that parenchymal abnormalities may be missed as intravenous contrast was not administered.  No free fluid, enlarged lymph nodes, biliary dilation or abdominal aortic aneurysm identified.  The bowel, appendix and bladder are unremarkable. No acute or suspicious bony abnormalities are identified.  IMPRESSION: No evidence of acute abnormality.   Stable mild splenomegaly.  Punctate nonobstructing right renal calculus.   Original Report Authenticated By: Harmon Pier, M.D.    12:17 AM Patient's pain is improved with IV medications. Patient was advised of CT scan findings. It is likely she recently passed a stone.         Jonny Ruiz  L Dewaun Kinzler, MD 07/07/12 0017

## 2012-07-06 NOTE — ED Notes (Signed)
Having right side pain, denies N/V. Started 2 days ago.

## 2012-07-07 MED ORDER — HYDROMORPHONE HCL 4 MG PO TABS: 2.0000 mg | ORAL_TABLET | ORAL | Status: DC | PRN

## 2012-07-08 MED FILL — Dextrose Inj 5%: INTRAVENOUS | Qty: 50 | Status: AC

## 2012-07-14 ENCOUNTER — Ambulatory Visit (INDEPENDENT_AMBULATORY_CARE_PROVIDER_SITE_OTHER): Payer: Self-pay | Admitting: Obstetrics & Gynecology

## 2012-07-14 ENCOUNTER — Encounter: Payer: Self-pay | Admitting: Obstetrics & Gynecology

## 2012-07-14 VITALS — BP 143/81 | HR 89 | Ht 68.0 in | Wt 296.8 lb

## 2012-07-14 DIAGNOSIS — N921 Excessive and frequent menstruation with irregular cycle: Secondary | ICD-10-CM

## 2012-07-14 DIAGNOSIS — N920 Excessive and frequent menstruation with regular cycle: Secondary | ICD-10-CM

## 2012-07-14 NOTE — Patient Instructions (Signed)
Hysteroscopy  Hysteroscopy is a procedure used for looking inside the womb (uterus). It may be done for many different reasons, including:  · To evaluate abnormal bleeding, fibroid (benign, noncancerous) tumors, polyps, scar tissue (adhesions), and possibly cancer of the uterus.  · To look for lumps (tumors) and other uterine growths.  · To look for causes of why a woman cannot get pregnant (infertility), causes of recurrent loss of pregnancy (miscarriages), or a lost intrauterine device (IUD).  · To perform a sterilization by blocking the fallopian tubes from inside the uterus.  A hysteroscopy should be done right after a menstrual period to be sure you are not pregnant.  LET YOUR CAREGIVER KNOW ABOUT:   · Allergies.  · Medicines taken, including herbs, eyedrops, over-the-counter medicines, and creams.  · Use of steroids (by mouth or creams).  · Previous problems with anesthetics or numbing medicines.  · History of bleeding or blood problems.  · History of blood clots.  · Possibility of pregnancy, if this applies.  · Previous surgery.  · Other health problems.  RISKS AND COMPLICATIONS   · Putting a hole in the uterus.  · Excessive bleeding.  · Infection.  · Damage to the cervix.  · Injury to other organs.  · Allergic reaction to medicines.  · Too much fluid used in the uterus for the procedure.  BEFORE THE PROCEDURE   · Do not take aspirin or blood thinners for a week before the procedure, or as directed. It can cause bleeding.  · Arrive at least 60 minutes before the procedure or as directed to read and sign the necessary forms.  · Arrange for someone to take you home after the procedure.  · If you smoke, do not smoke for 2 weeks before the procedure.  PROCEDURE   · Your caregiver may give you medicine to relax you. He or she may also give you a medicine that numbs the area around the cervix (local anesthetic) or a medicine that makes you sleep (general anesthesia).  · Sometimes, a medicine is placed in the cervix  the day before the procedure. This medicine makes the cervix have a larger opening (dilate). This makes it easier for the instrument to be inserted into the uterus.  · A small instrument (hysteroscope) is inserted through the vagina into the uterus. This instrument is similar to a pencil-sized telescope with a light.  · During the procedure, air or a liquid is put into the uterus, which allows the surgeon to see better.  · Sometimes, tissue is gently scraped from inside the uterus. These tissue samples are sent to a specialist who looks at tissue samples (pathologist). The pathologist will give a report to your caregiver. This will help your caregiver decide if further treatment is necessary. The report will also help your caregiver decide on the best treatment if the test comes back abnormal.  AFTER THE PROCEDURE   · If you had a general anesthetic, you may be groggy for a couple hours after the procedure.  · If you had a local anesthetic, you will be advised to rest at the surgical center or caregiver's office until you are stable and feel ready to go home.  · You may have some cramping for a couple days.  · You may have bleeding, which varies from light spotting for a few days to menstrual-like bleeding for up to 3 to 7 days. This is normal.  · Have someone take you home.  FINDING OUT THE   RESULTS OF YOUR TEST  Not all test results are available during your visit. If your test results are not back during the visit, make an appointment with your caregiver to find out the results. Do not assume everything is normal if you have not heard from your caregiver or the medical facility. It is important for you to follow up on all of your test results.  HOME CARE INSTRUCTIONS   · Do not drive for 24 hours or as instructed.  · Only take over-the-counter or prescription medicines for pain, discomfort, or fever as directed by your caregiver.  · Do not take aspirin. It can cause or aggravate bleeding.  · Do not drive or drink  alcohol while taking pain medicine.  · You may resume your usual diet.  · Do not use tampons, douche, or have sexual intercourse for 2 weeks, or as advised by your caregiver.  · Rest and sleep for the first 24 to 48 hours.  · Take your temperature twice a day for 4 to 5 days. Write it down. Give these temperatures to your caregiver if they are abnormal (above 98.6° F or 37.0° C).  · Take medicines your caregiver has ordered as directed.  · Follow your caregiver's advice regarding diet, exercise, lifting, driving, and general activities.  · Take showers instead of baths for 2 weeks, or as recommended by your caregiver.  · If you develop constipation:  · Take a mild laxative with the advice of your caregiver.  · Eat bran foods.  · Drink enough water and fluids to keep your urine clear or pale yellow.  · Try to have someone with you or available to you for the first 24 to 48 hours, especially if you had a general anesthetic.  · Make sure you and your family understand everything about your operation and recovery.  · Follow your caregiver's advice regarding follow-up appointments and Pap smears.  SEEK MEDICAL CARE IF:   · You feel dizzy or lightheaded.  · You feel sick to your stomach (nauseous).  · You develop abnormal vaginal discharge.  · You develop a rash.  · You have an abnormal reaction or allergy to your medicine.  · You need stronger pain medicine.  SEEK IMMEDIATE MEDICAL CARE IF:   · Bleeding is heavier than a normal menstrual period or you have blood clots.  · You have an oral temperature above 102° F (38.9° C), not controlled by medicine.  · You have increasing cramps or pains not relieved with medicine.  · You develop belly (abdominal) pain that does not seem to be related to the same area of earlier cramping and pain.  · You pass out.  · You develop pain in the tops of your shoulders (shoulder strap areas).  · You develop shortness of breath.  MAKE SURE YOU:   · Understand these instructions.  · Will watch  your condition.  · Will get help right away if you are not doing well or get worse.  Document Released: 08/03/2000 Document Revised: 07/20/2011 Document Reviewed: 11/26/2008  [employer]® Patient Information ©2013 [employer].

## 2012-07-14 NOTE — Progress Notes (Signed)
  Subjective:    Patient ID: Joan Becker, female    DOB: 1985-09-04, 27 y.o.   MRN: [id number]  [id number] Patient's last menstrual period was 03/30/2012. Still bleeding on OCP 2/day. Lower abdominal cramping    Review of Systems As noted    Objective:   Physical Exam Filed Vitals:   07/14/12 1454  Height: 5\' 8"  (1.727 m)  Weight: 296 lb 12.8 oz (134.628 kg)   NAD, not pale   *RADIOLOGY REPORT*  Clinical Data: Abnormal uterine bleeding. Polycystic ovary  syndrome and anovulatory cycles.  TRANSVAGINAL ULTRASOUND OF PELVIS  Technique: Transvaginal ultrasound examination of the pelvis was  performed including evaluation of the uterus, ovaries, adnexal  regions, and pelvic cul-de-sac.  Comparison: 04/04/2012  Findings:  Uterus: 9.5 x 6.5 x 7.1 cm. No fibroids identified.  Endometrium: Diffusely thickened and heterogeneous appearance  of endometrium measuring 27 cm. Differential diagnosis includes  endometrial hyperplasia, polyps, and carcinoma.  Right Ovary: 3.4 x 2.1 x 2.4 cm. Numerous small, less than 1 cm  follicles, without evidence of dominant follicle or corpus luteum.  No ovarian or adnexal mass identified.  Left Ovary: 3.5 x 2.1 x 2.1 cm. Numerous small, less than 1 cm  follicles, without evidence of dominant follicle or corpus luteum.  No ovarian or adnexal mass identified.  Other Findings: No free fluid.  IMPRESSION:  1. Diffusely thickened and heterogeneous appearance of endometrium  measuring 27 mm. Differential diagnosis includes endometrial  hyperplasia, polyps, and carcinoma. Endometrial biopsy should  also be considered in pre-menopausal patients at high risk for  endometrial carcinoma. (Ref: Radiological Reasoning: Algorithmic  Workup of Abnormal Vaginal Bleeding with Endovaginal Sonography and  Sonohysterography. AJR 2008; 829:F62-13)  2. Numerous small bilateral ovarian follicles, without evidence of  dominant follicle or corpus luteum. These findings  meet  sonographic criteria for polycystic ovary syndrome; suggest  clinical correlation and biochemical testing for confirmation.  Original Report Authenticated By: Myles Rosenthal, M.D.      Assessment & Plan:  DUB, endometrial abnormality  Hysteroscopy, D&C. Procedure and risk of anesthesia, bleeding, infection, pain discussed. Questions answered.  [name] 07/14/2012 3:56 PM

## 2012-07-20 ENCOUNTER — Encounter: Payer: Self-pay | Admitting: Obstetrics & Gynecology

## 2012-07-28 ENCOUNTER — Encounter (HOSPITAL_COMMUNITY): Payer: Self-pay | Admitting: Pharmacist

## 2012-08-01 ENCOUNTER — Encounter: Payer: Self-pay | Admitting: Obstetrics & Gynecology

## 2012-08-01 ENCOUNTER — Ambulatory Visit (INDEPENDENT_AMBULATORY_CARE_PROVIDER_SITE_OTHER): Payer: Self-pay | Admitting: Obstetrics & Gynecology

## 2012-08-01 VITALS — BP 143/93 | HR 84 | Temp 97.0°F | Ht 67.0 in | Wt 294.0 lb

## 2012-08-01 DIAGNOSIS — N949 Unspecified condition associated with female genital organs and menstrual cycle: Secondary | ICD-10-CM

## 2012-08-01 DIAGNOSIS — N920 Excessive and frequent menstruation with regular cycle: Secondary | ICD-10-CM

## 2012-08-01 DIAGNOSIS — N938 Other specified abnormal uterine and vaginal bleeding: Secondary | ICD-10-CM

## 2012-08-01 DIAGNOSIS — N921 Excessive and frequent menstruation with irregular cycle: Secondary | ICD-10-CM

## 2012-08-01 DIAGNOSIS — N925 Other specified irregular menstruation: Secondary | ICD-10-CM

## 2012-08-01 DIAGNOSIS — E039 Hypothyroidism, unspecified: Secondary | ICD-10-CM

## 2012-08-01 LAB — CBC
Hemoglobin: 6.2 g/dL — CL (ref 12.0–15.0)
MCH: 16.6 pg — ABNORMAL LOW (ref 26.0–34.0)
MCHC: 27.8 g/dL — ABNORMAL LOW (ref 30.0–36.0)
Platelets: 348 10*3/uL (ref 150–400)
RDW: 18.7 % — ABNORMAL HIGH (ref 11.5–15.5)

## 2012-08-01 LAB — TSH: TSH: 4.371 u[IU]/mL (ref 0.350–4.500)

## 2012-08-01 NOTE — Patient Instructions (Signed)
Hysteroscopy  Hysteroscopy is a procedure used for looking inside the womb (uterus). It may be done for many different reasons, including:  · To evaluate abnormal bleeding, fibroid (benign, noncancerous) tumors, polyps, scar tissue (adhesions), and possibly cancer of the uterus.  · To look for lumps (tumors) and other uterine growths.  · To look for causes of why a woman cannot get pregnant (infertility), causes of recurrent loss of pregnancy (miscarriages), or a lost intrauterine device (IUD).  · To perform a sterilization by blocking the fallopian tubes from inside the uterus.  A hysteroscopy should be done right after a menstrual period to be sure you are not pregnant.  LET YOUR CAREGIVER KNOW ABOUT:   · Allergies.  · Medicines taken, including herbs, eyedrops, over-the-counter medicines, and creams.  · Use of steroids (by mouth or creams).  · Previous problems with anesthetics or numbing medicines.  · History of bleeding or blood problems.  · History of blood clots.  · Possibility of pregnancy, if this applies.  · Previous surgery.  · Other health problems.  RISKS AND COMPLICATIONS   · Putting a hole in the uterus.  · Excessive bleeding.  · Infection.  · Damage to the cervix.  · Injury to other organs.  · Allergic reaction to medicines.  · Too much fluid used in the uterus for the procedure.  BEFORE THE PROCEDURE   · Do not take aspirin or blood thinners for a week before the procedure, or as directed. It can cause bleeding.  · Arrive at least 60 minutes before the procedure or as directed to read and sign the necessary forms.  · Arrange for someone to take you home after the procedure.  · If you smoke, do not smoke for 2 weeks before the procedure.  PROCEDURE   · Your caregiver may give you medicine to relax you. He or she may also give you a medicine that numbs the area around the cervix (local anesthetic) or a medicine that makes you sleep (general anesthesia).  · Sometimes, a medicine is placed in the cervix  the day before the procedure. This medicine makes the cervix have a larger opening (dilate). This makes it easier for the instrument to be inserted into the uterus.  · A small instrument (hysteroscope) is inserted through the vagina into the uterus. This instrument is similar to a pencil-sized telescope with a light.  · During the procedure, air or a liquid is put into the uterus, which allows the surgeon to see better.  · Sometimes, tissue is gently scraped from inside the uterus. These tissue samples are sent to a specialist who looks at tissue samples (pathologist). The pathologist will give a report to your caregiver. This will help your caregiver decide if further treatment is necessary. The report will also help your caregiver decide on the best treatment if the test comes back abnormal.  AFTER THE PROCEDURE   · If you had a general anesthetic, you may be groggy for a couple hours after the procedure.  · If you had a local anesthetic, you will be advised to rest at the surgical center or caregiver's office until you are stable and feel ready to go home.  · You may have some cramping for a couple days.  · You may have bleeding, which varies from light spotting for a few days to menstrual-like bleeding for up to 3 to 7 days. This is normal.  · Have someone take you home.  FINDING OUT THE   RESULTS OF YOUR TEST  Not all test results are available during your visit. If your test results are not back during the visit, make an appointment with your caregiver to find out the results. Do not assume everything is normal if you have not heard from your caregiver or the medical facility. It is important for you to follow up on all of your test results.  HOME CARE INSTRUCTIONS   · Do not drive for 24 hours or as instructed.  · Only take over-the-counter or prescription medicines for pain, discomfort, or fever as directed by your caregiver.  · Do not take aspirin. It can cause or aggravate bleeding.  · Do not drive or drink  alcohol while taking pain medicine.  · You may resume your usual diet.  · Do not use tampons, douche, or have sexual intercourse for 2 weeks, or as advised by your caregiver.  · Rest and sleep for the first 24 to 48 hours.  · Take your temperature twice a day for 4 to 5 days. Write it down. Give these temperatures to your caregiver if they are abnormal (above 98.6° F or 37.0° C).  · Take medicines your caregiver has ordered as directed.  · Follow your caregiver's advice regarding diet, exercise, lifting, driving, and general activities.  · Take showers instead of baths for 2 weeks, or as recommended by your caregiver.  · If you develop constipation:  · Take a mild laxative with the advice of your caregiver.  · Eat bran foods.  · Drink enough water and fluids to keep your urine clear or pale yellow.  · Try to have someone with you or available to you for the first 24 to 48 hours, especially if you had a general anesthetic.  · Make sure you and your family understand everything about your operation and recovery.  · Follow your caregiver's advice regarding follow-up appointments and Pap smears.  SEEK MEDICAL CARE IF:   · You feel dizzy or lightheaded.  · You feel sick to your stomach (nauseous).  · You develop abnormal vaginal discharge.  · You develop a rash.  · You have an abnormal reaction or allergy to your medicine.  · You need stronger pain medicine.  SEEK IMMEDIATE MEDICAL CARE IF:   · Bleeding is heavier than a normal menstrual period or you have blood clots.  · You have an oral temperature above 102° F (38.9° C), not controlled by medicine.  · You have increasing cramps or pains not relieved with medicine.  · You develop belly (abdominal) pain that does not seem to be related to the same area of earlier cramping and pain.  · You pass out.  · You develop pain in the tops of your shoulders (shoulder strap areas).  · You develop shortness of breath.  MAKE SURE YOU:   · Understand these instructions.  · Will watch  your condition.  · Will get help right away if you are not doing well or get worse.  Document Released: 08/03/2000 Document Revised: 07/20/2011 Document Reviewed: 11/26/2008  [employer]® Patient Information ©2013 [employer].

## 2012-08-01 NOTE — Progress Notes (Signed)
Patient ID: Joan Becker, female   DOB: 01/28/1986, 27 y.o.   MRN: [id number]  Chief Complaint  Patient presents with  . Pre-op Exam    HPI Joan Becker is a 27 y.o. female.  No LMP recorded.Still has DUB but improve. She feels tired. Scheduled for hysteroscopy and D&C next week. Procedure and risks explained.   HPI  Past Medical History  Diagnosis Date  . Thyroid disease   . Hypothyroidism   . Obesity   . Kidney stones     Past Surgical History  Procedure Laterality Date  . Tonsillectomy    . Adenoidectomy    . Wisdom tooth extraction      Family History  Problem Relation Age of Onset  . Hearing loss Maternal Aunt   . Hearing loss Maternal Uncle   . Hearing loss Paternal Aunt   . Hearing loss Paternal Uncle   . Hearing loss Maternal Grandmother   . Hearing loss Maternal Grandfather   . Hearing loss Paternal Grandmother   . Hearing loss Paternal Grandfather     Social History History  Substance Use Topics  . Smoking status: Never Smoker   . Smokeless tobacco: Never Used  . Alcohol Use: No    No Known Allergies  Current Outpatient Prescriptions  Medication Sig Dispense Refill  . ferrous sulfate 325 (65 FE) MG tablet Take 325 mg by mouth daily with breakfast.      . ibuprofen (ADVIL,MOTRIN) 200 MG tablet Take 600 mg by mouth every 6 (six) hours as needed for pain.      Marland Kitchen levothyroxine (SYNTHROID, LEVOTHROID) 175 MCG tablet Take 1 tablet (175 mcg total) by mouth daily.  30 tablet  11  . norethindrone-ethinyl estradiol 1/35 (ORTHO-NOVUM 1/35, 28,) tablet 2 tablets daily until bleeding stops then 1 daily  2 Package  6   No current facility-administered medications for this visit.    Review of Systems Review of Systems  Constitutional: Positive for fatigue.  Genitourinary: Positive for vaginal bleeding and menstrual problem.    Blood pressure 143/93, pulse 84, temperature 97 F (36.1 C), height 5\' 7"  (1.702 m), weight 294 lb (133.358 kg).  Physical  Exam Physical Exam  Constitutional: No distress.  obese  Pulmonary/Chest: Effort normal. No respiratory distress.  Psychiatric: She has a normal mood and affect. Her behavior is normal.    Data Reviewed [place] report CBC    Component Value Date/Time   WBC 7.3 06/15/2012 1545   RBC 3.93 06/15/2012 1545   HGB 7.2* 06/15/2012 1545   HCT 24.3* 06/15/2012 1545   PLT 361 06/15/2012 1545   MCV 61.8* 06/15/2012 1545   MCH 18.3* 06/15/2012 1545   MCHC 29.6* 06/15/2012 1545   RDW 18.8* 06/15/2012 1545   LYMPHSABS 1.8 12/02/2006 1243   MONOABS 0.5 12/02/2006 1243   EOSABS 0.1 12/02/2006 1243   BASOSABS 0.0 12/02/2006 1243     Assessment    DUB, PCOS, H/O anemia     Plan    CBC and TSH pre-op         Joan Becker 08/01/2012, 1:13 PM

## 2012-08-02 ENCOUNTER — Other Ambulatory Visit: Payer: Self-pay | Admitting: Obstetrics & Gynecology

## 2012-08-03 ENCOUNTER — Telehealth: Payer: Self-pay | Admitting: *Deleted

## 2012-08-03 ENCOUNTER — Observation Stay (HOSPITAL_COMMUNITY)
Admission: AD | Admit: 2012-08-03 | Discharge: 2012-08-04 | Disposition: A | Discharge status: Home / Self Care | Payer: MEDICAID | Source: Ambulatory Visit | Attending: Obstetrics & Gynecology | Admitting: Obstetrics & Gynecology

## 2012-08-03 ENCOUNTER — Encounter (HOSPITAL_COMMUNITY): Payer: Self-pay | Admitting: *Deleted

## 2012-08-03 DIAGNOSIS — D500 Iron deficiency anemia secondary to blood loss (chronic): Principal | ICD-10-CM | POA: Insufficient documentation

## 2012-08-03 DIAGNOSIS — E669 Obesity, unspecified: Secondary | ICD-10-CM | POA: Insufficient documentation

## 2012-08-03 DIAGNOSIS — E039 Hypothyroidism, unspecified: Secondary | ICD-10-CM | POA: Insufficient documentation

## 2012-08-03 DIAGNOSIS — N949 Unspecified condition associated with female genital organs and menstrual cycle: Secondary | ICD-10-CM | POA: Insufficient documentation

## 2012-08-03 DIAGNOSIS — N938 Other specified abnormal uterine and vaginal bleeding: Secondary | ICD-10-CM | POA: Insufficient documentation

## 2012-08-03 LAB — URINALYSIS, ROUTINE W REFLEX MICROSCOPIC
Ketones, ur: NEGATIVE mg/dL
Leukocytes, UA: NEGATIVE
Nitrite: NEGATIVE
Protein, ur: 30 mg/dL — AB
Urobilinogen, UA: 0.2 mg/dL (ref 0.0–1.0)
pH: 5.5 (ref 5.0–8.0)

## 2012-08-03 LAB — CBC
MCH: 16.9 pg — ABNORMAL LOW (ref 26.0–34.0)
MCHC: 26.9 g/dL — ABNORMAL LOW (ref 30.0–36.0)
Platelets: 289 10*3/uL (ref 150–400)
RBC: 3.54 MIL/uL — ABNORMAL LOW (ref 3.87–5.11)
RDW: 18.7 % — ABNORMAL HIGH (ref 11.5–15.5)

## 2012-08-03 LAB — URINE MICROSCOPIC-ADD ON

## 2012-08-03 LAB — PREPARE RBC (CROSSMATCH)

## 2012-08-03 LAB — APTT: aPTT: 27 seconds (ref 24–37)

## 2012-08-03 MED ORDER — ESTROGENS CONJUGATED 25 MG IJ SOLR
25.0000 mg | Freq: Once | INTRAMUSCULAR | Status: AC
  Administered 2012-08-03: 25 mg via INTRAVENOUS
  Filled 2012-08-03: qty 25

## 2012-08-03 MED ORDER — SODIUM CHLORIDE 0.9 % IV SOLN
INTRAVENOUS | Status: DC
  Administered 2012-08-03: 16:00:00 via INTRAVENOUS

## 2012-08-03 MED ORDER — TRAMADOL HCL 50 MG PO TABS
50.0000 mg | ORAL_TABLET | Freq: Four times a day (QID) | ORAL | Status: DC | PRN
  Administered 2012-08-03 – 2012-08-04 (×2): 50 mg via ORAL
  Filled 2012-08-03 (×3): qty 1

## 2012-08-03 MED ORDER — ACETAMINOPHEN 325 MG PO TABS
650.0000 mg | ORAL_TABLET | ORAL | Status: DC | PRN
  Administered 2012-08-03: 650 mg via ORAL
  Filled 2012-08-03: qty 2

## 2012-08-03 NOTE — MAU Note (Signed)
C/o bleeding since November 20th  But bleeding has been lighter since she was taking 2 BC pills a day until yesterday and then sh has been having very heavy bleeding; states that she went through 10 tampons in 3 hours; feels faint; feels nauseated; feels "funny"; scheduled for surgery next Thursday with Dr Debroah Loop; has been going to the clinic for her care;

## 2012-08-03 NOTE — Telephone Encounter (Signed)
Pt left message requesting test results- may be @ work when call back- can call work.

## 2012-08-03 NOTE — Telephone Encounter (Signed)
Called patient and told her I received her message about wanting test results and informed her that her TSH was normal and that her iron level was still low but as of right now we haven't heard anything from the doctor about action needed to be taken but if so we would call her back and let her know. Patient verbalized understanding and had no further questions

## 2012-08-03 NOTE — MAU Provider Note (Signed)
  History     CSN: [id number]  Arrival date and time: 08/03/12 1357   First Provider Initiated Contact with Patient 08/03/12 1451      Chief Complaint  Patient presents with  . Vaginal Bleeding   HPI G0P0 No LMP recorded. Patient with persistent DUB which was improved on OCP twice a day until yesterday, now heavy and she feels bad, chest"feels funny", no SOB or CP.  Scheduled for hysteroscopy and D and C 08/11/12, US showed abnormal endometrial thickening.  Pertinent Gynecological History: Menses: flow is moderate Bleeding: dysfunctional uterine bleeding Contraception: OCP (estrogen/progesterone) DES exposure: denies Blood transfusions: none Sexually transmitted diseases: no past history Previous GYN Procedures:    Clomid cycles 09/2011 Dr. Estanislado Pandy Last pap: normal    Past Medical History  Diagnosis Date  . Thyroid disease   . Hypothyroidism   . Obesity   . Kidney stones     Past Surgical History  Procedure Laterality Date  . Tonsillectomy    . Adenoidectomy    . Wisdom tooth extraction      Family History  Problem Relation Age of Onset  . Hearing loss Maternal Aunt   . Hearing loss Maternal Uncle   . Hearing loss Paternal Aunt   . Hearing loss Paternal Uncle   . Hearing loss Maternal Grandmother   . Hearing loss Maternal Grandfather   . Hearing loss Paternal Grandmother   . Hearing loss Paternal Grandfather     History  Substance Use Topics  . Smoking status: Never Smoker   . Smokeless tobacco: Never Used  . Alcohol Use: No    Allergies: No Known Allergies  Prescriptions prior to admission  Medication Sig Dispense Refill  . ferrous sulfate 325 (65 FE) MG tablet Take 325 mg by mouth daily with breakfast.      . ibuprofen (ADVIL,MOTRIN) 200 MG tablet Take 600 mg by mouth every 6 (six) hours as needed for pain.      Marland Kitchen levothyroxine (SYNTHROID, LEVOTHROID) 175 MCG tablet Take 1 tablet (175 mcg total) by mouth daily.  30 tablet  11  . norethindrone-ethinyl  estradiol 1/35 (ORTHO-NOVUM 1/35, 28,) tablet 2 tablets daily until bleeding stops then 1 daily  2 Package  6    Review of Systems  Respiratory:       Chest feels funny.  Cardiovascular: Negative.   Gastrointestinal: Negative for nausea.  Neurological: Positive for weakness.   Physical Exam   Blood pressure 79/57, pulse 84, temperature 98.3 F (36.8 C), temperature source Oral, resp. rate 18, height 5' 7.5" (1.715 m), weight 296 lb (134.265 kg).  Physical Exam  Constitutional: No distress.  obese  Cardiovascular: Normal rate.   Murmur (flow murmur) heard. Respiratory: Effort normal and breath sounds normal. No respiratory distress.  GI: Soft. She exhibits no distension and no mass.  Genitourinary:  deferred  Skin: Skin is warm and dry.  Psychiatric: She has a normal mood and affect. Her behavior is normal.    MAU Course  Procedures  MDM complex  Assessment and Plan  Symptomatic anemia due to DUB unresponsive to [facility] with OCP. Abnormal pelvic US suspicious for hyperplasia.  Will transfuse 3 units PRBC and give Premarin IV 25 mg. Risk and benefit of transfusion explained and she accepts.  ARNOLD,JAMES 08/03/2012, 3:00 PM

## 2012-08-04 DIAGNOSIS — N949 Unspecified condition associated with female genital organs and menstrual cycle: Secondary | ICD-10-CM

## 2012-08-04 DIAGNOSIS — D649 Anemia, unspecified: Secondary | ICD-10-CM

## 2012-08-04 LAB — TYPE AND SCREEN
ABO/RH(D): A POS
Antibody Screen: NEGATIVE

## 2012-08-04 LAB — CBC
Hemoglobin: 8.4 g/dL — ABNORMAL LOW (ref 12.0–15.0)
MCH: 19.9 pg — ABNORMAL LOW (ref 26.0–34.0)
Platelets: 306 10*3/uL (ref 150–400)
RBC: 4.22 MIL/uL (ref 3.87–5.11)
WBC: 7.6 10*3/uL (ref 4.0–10.5)

## 2012-08-04 NOTE — H&P (Signed)
  History     CSN: [id number]  Arrival date and time: 08/03/12 1357   First Provider Initiated Contact with Patient 08/03/12 1451      Chief Complaint  Patient presents with  . Vaginal Bleeding   HPI G0P0 No LMP recorded. Patient with persistent DUB which was improved on OCP twice a day until yesterday, now heavy and she feels bad, chest"feels funny", no SOB or CP.  Scheduled for hysteroscopy and D and C 08/11/12, US showed abnormal endometrial thickening.  Pertinent Gynecological History: Menses: flow is moderate Bleeding: dysfunctional uterine bleeding Contraception: OCP (estrogen/progesterone) DES exposure: denies Blood transfusions: none Sexually transmitted diseases: no past history Previous GYN Procedures:    Clomid cycles 09/2011 Dr. Rivard Last pap: normal    Past Medical History  Diagnosis Date  . Thyroid disease   . Hypothyroidism   . Obesity   . Kidney stones     Past Surgical History  Procedure Laterality Date  . Tonsillectomy    . Adenoidectomy    . Wisdom tooth extraction      Family History  Problem Relation Age of Onset  . Hearing loss Maternal Aunt   . Hearing loss Maternal Uncle   . Hearing loss Paternal Aunt   . Hearing loss Paternal Uncle   . Hearing loss Maternal Grandmother   . Hearing loss Maternal Grandfather   . Hearing loss Paternal Grandmother   . Hearing loss Paternal Grandfather     History  Substance Use Topics  . Smoking status: Never Smoker   . Smokeless tobacco: Never Used  . Alcohol Use: No    Allergies: No Known Allergies  Prescriptions prior to admission  Medication Sig Dispense Refill  . ferrous sulfate 325 (65 FE) MG tablet Take 325 mg by mouth daily with breakfast.      . ibuprofen (ADVIL,MOTRIN) 200 MG tablet Take 600 mg by mouth every 6 (six) hours as needed for pain.      . levothyroxine (SYNTHROID, LEVOTHROID) 175 MCG tablet Take 1 tablet (175 mcg total) by mouth daily.  30 tablet  11  . norethindrone-ethinyl  estradiol 1/35 (ORTHO-NOVUM 1/35, 28,) tablet 2 tablets daily until bleeding stops then 1 daily  2 Package  6    Review of Systems  Respiratory:       Chest feels funny.  Cardiovascular: Negative.   Gastrointestinal: Negative for nausea.  Neurological: Positive for weakness.   Physical Exam   Blood pressure 79/57, pulse 84, temperature 98.3 F (36.8 C), temperature source Oral, resp. rate 18, height 5' 7.5" (1.715 m), weight 296 lb (134.265 kg).  Physical Exam  Constitutional: No distress.  obese  Cardiovascular: Normal rate.   Murmur (flow murmur) heard. Respiratory: Effort normal and breath sounds normal. No respiratory distress.  GI: Soft. She exhibits no distension and no mass.  Genitourinary:  deferred  Skin: Skin is warm and dry.  Psychiatric: She has a normal mood and affect. Her behavior is normal.    MAU Course  Procedures  MDM complex  Assessment and Plan  Symptomatic anemia due to DUB unresponsive to HRC with OCP. Abnormal pelvic US suspicious for hyperplasia.  Will transfuse 3 units PRBC and give Premarin IV 25 mg. Risk and benefit of transfusion explained and she accepts.  Edom Schmuhl 08/03/2012, 3:00 PM  

## 2012-08-08 NOTE — Discharge Summary (Signed)
Physician Discharge Summary  Patient ID: Joan Becker MRN: [id number] DOB/AGE: 05-Oct-1985 27 y.o.  Admit date: 08/03/2012 Discharge date: 08/08/2012  Admission Diagnoses:anemia due to DUB  Discharge Diagnoses: same Active Problems:   * No active hospital problems. *   Discharged Condition: good  Hospital Course: Transfused 3 units PRBC  Consults: None  Significant Diagnostic Studies: labs: CBC   CBC    Component Value Date/Time   WBC 7.6 08/04/2012 0540   RBC 4.22 08/04/2012 0540   HGB 8.4* 08/04/2012 0540   HCT 28.4* 08/04/2012 0540   PLT 306 08/04/2012 0540   MCV 67.3* 08/04/2012 0540   MCH 19.9* 08/04/2012 0540   MCHC 29.6* 08/04/2012 0540   RDW 21.8* 08/04/2012 0540   LYMPHSABS 1.8 12/02/2006 1243   MONOABS 0.5 12/02/2006 1243   EOSABS 0.1 12/02/2006 1243   BASOSABS 0.0 12/02/2006 1243      Treatments: therapies: transfusion  Discharge Exam: Blood pressure 99/65, pulse 78, temperature 98.2 F (36.8 C), temperature source Oral, resp. rate 18, height 5' 7.5" (1.715 m), weight 292 lb 8 oz (132.677 kg), SpO2 99.00%. General appearance: alert, cooperative and no distress GI: soft, non-tender; bowel sounds normal; no masses,  no organomegaly  Disposition: 01-Home or Self Care   Future Appointments Provider Department Dept Phone   08/29/2012 1:30 PM Joan Phenix, MD [facility] [phone]       Medication List    TAKE these medications       aspirin 81 MG chewable tablet  Chew 81 mg by mouth daily as needed for pain.     ferrous sulfate 325 (65 FE) MG tablet  Take 325 mg by mouth daily with breakfast.     ibuprofen 200 MG tablet  Commonly known as:  ADVIL,MOTRIN  Take 600 mg by mouth every 6 (six) hours as needed for pain.     levothyroxine 175 MCG tablet  Commonly known as:  SYNTHROID, LEVOTHROID  Take 1 tablet (175 mcg total) by mouth daily.     norethindrone-ethinyl estradiol 1/35 tablet  Commonly known as:  ORTHO-NOVUM, NORTREL,CYCLAFEM   Take 2 tablets by mouth daily. 2 tablets daily until bleeding stops then 1 daily           Follow-up Information   Follow up with Surgery 08/11/12 In 1 week.    Hysteroscopy D&C planned  Signed: [name] 08/08/2012, 1:58 PM

## 2012-08-11 ENCOUNTER — Encounter (HOSPITAL_COMMUNITY)
Admission: RE | Disposition: A | Discharge status: Home / Self Care | Payer: Self-pay | Source: Ambulatory Visit | Attending: Obstetrics & Gynecology

## 2012-08-11 ENCOUNTER — Ambulatory Visit (HOSPITAL_COMMUNITY)
Admission: RE | Admit: 2012-08-11 | Discharge: 2012-08-11 | Disposition: A | Discharge status: Home / Self Care | Payer: MEDICAID | Source: Ambulatory Visit | Attending: Obstetrics & Gynecology | Admitting: Obstetrics & Gynecology

## 2012-08-11 ENCOUNTER — Encounter (HOSPITAL_COMMUNITY): Payer: Self-pay | Admitting: Anesthesiology

## 2012-08-11 ENCOUNTER — Ambulatory Visit (HOSPITAL_COMMUNITY): Payer: MEDICAID | Admitting: Anesthesiology

## 2012-08-11 DIAGNOSIS — N938 Other specified abnormal uterine and vaginal bleeding: Secondary | ICD-10-CM | POA: Insufficient documentation

## 2012-08-11 DIAGNOSIS — N949 Unspecified condition associated with female genital organs and menstrual cycle: Secondary | ICD-10-CM

## 2012-08-11 DIAGNOSIS — N840 Polyp of corpus uteri: Secondary | ICD-10-CM

## 2012-08-11 DIAGNOSIS — R9389 Abnormal findings on diagnostic imaging of other specified body structures: Secondary | ICD-10-CM | POA: Insufficient documentation

## 2012-08-11 DIAGNOSIS — D500 Iron deficiency anemia secondary to blood loss (chronic): Secondary | ICD-10-CM | POA: Insufficient documentation

## 2012-08-11 HISTORY — PX: HYSTEROSCOPY WITH D & C: SHX1775

## 2012-08-11 LAB — CBC
HCT: 33.8 % — ABNORMAL LOW (ref 36.0–46.0)
Hemoglobin: 9.7 g/dL — ABNORMAL LOW (ref 12.0–15.0)
RBC: 4.8 MIL/uL (ref 3.87–5.11)
WBC: 6.1 10*3/uL (ref 4.0–10.5)

## 2012-08-11 LAB — PREGNANCY, URINE: Preg Test, Ur: NEGATIVE

## 2012-08-11 SURGERY — DILATATION AND CURETTAGE /HYSTEROSCOPY
Anesthesia: General | Site: Vagina | Wound class: Clean Contaminated

## 2012-08-11 MED ORDER — LACTATED RINGERS IV SOLN
INTRAVENOUS | Status: DC
  Administered 2012-08-11: 11:00:00 via INTRAVENOUS

## 2012-08-11 MED ORDER — LACTATED RINGERS IV SOLN: INTRAVENOUS | Status: DC

## 2012-08-11 MED ORDER — ONDANSETRON HCL 4 MG/2ML IJ SOLN
INTRAMUSCULAR | Status: AC
  Filled 2012-08-11: qty 2

## 2012-08-11 MED ORDER — MIDAZOLAM HCL 5 MG/5ML IJ SOLN
INTRAMUSCULAR | Status: DC | PRN
  Administered 2012-08-11: 2 mg via INTRAVENOUS

## 2012-08-11 MED ORDER — PROMETHAZINE HCL 25 MG/ML IJ SOLN: 6.2500 mg | INTRAMUSCULAR | Status: DC | PRN

## 2012-08-11 MED ORDER — HYDROCODONE-ACETAMINOPHEN 5-500 MG PO TABS: 1.0000 | ORAL_TABLET | Freq: Four times a day (QID) | ORAL | Status: DC | PRN

## 2012-08-11 MED ORDER — FENTANYL CITRATE 0.05 MG/ML IJ SOLN
INTRAMUSCULAR | Status: AC
  Administered 2012-08-11: 50 ug via INTRAVENOUS
  Filled 2012-08-11: qty 2

## 2012-08-11 MED ORDER — MEPERIDINE HCL 25 MG/ML IJ SOLN: 6.2500 mg | INTRAMUSCULAR | Status: DC | PRN

## 2012-08-11 MED ORDER — KETOROLAC TROMETHAMINE 30 MG/ML IJ SOLN
INTRAMUSCULAR | Status: DC | PRN
  Administered 2012-08-11: 30 mg via INTRAVENOUS

## 2012-08-11 MED ORDER — PROPOFOL 10 MG/ML IV EMUL
INTRAVENOUS | Status: DC | PRN
  Administered 2012-08-11: 280 mg via INTRAVENOUS
  Administered 2012-08-11: 20 mg via INTRAVENOUS

## 2012-08-11 MED ORDER — BUPIVACAINE-EPINEPHRINE (PF) 0.5% -1:200000 IJ SOLN
INTRAMUSCULAR | Status: AC
  Filled 2012-08-11: qty 10

## 2012-08-11 MED ORDER — MIDAZOLAM HCL 2 MG/2ML IJ SOLN
INTRAMUSCULAR | Status: AC
  Filled 2012-08-11: qty 2

## 2012-08-11 MED ORDER — FENTANYL CITRATE 0.05 MG/ML IJ SOLN
INTRAMUSCULAR | Status: DC | PRN
  Administered 2012-08-11: 100 ug via INTRAVENOUS

## 2012-08-11 MED ORDER — LACTATED RINGERS IR SOLN
Status: DC | PRN
  Administered 2012-08-11: 3000 mL

## 2012-08-11 MED ORDER — BUPIVACAINE-EPINEPHRINE 0.5% -1:200000 IJ SOLN
INTRAMUSCULAR | Status: DC | PRN
  Administered 2012-08-11: 8 mL

## 2012-08-11 MED ORDER — LIDOCAINE HCL (CARDIAC) 20 MG/ML IV SOLN
INTRAVENOUS | Status: AC
  Filled 2012-08-11: qty 5

## 2012-08-11 MED ORDER — FENTANYL CITRATE 0.05 MG/ML IJ SOLN
INTRAMUSCULAR | Status: AC
  Filled 2012-08-11: qty 2

## 2012-08-11 MED ORDER — MIDAZOLAM HCL 2 MG/2ML IJ SOLN: 0.5000 mg | Freq: Once | INTRAMUSCULAR | Status: DC | PRN

## 2012-08-11 MED ORDER — LIDOCAINE HCL (CARDIAC) 20 MG/ML IV SOLN
INTRAVENOUS | Status: DC | PRN
  Administered 2012-08-11 (×2): 50 mg via INTRAVENOUS

## 2012-08-11 MED ORDER — KETOROLAC TROMETHAMINE 30 MG/ML IJ SOLN
INTRAMUSCULAR | Status: AC
  Filled 2012-08-11: qty 1

## 2012-08-11 MED ORDER — KETOROLAC TROMETHAMINE 30 MG/ML IJ SOLN: 15.0000 mg | Freq: Once | INTRAMUSCULAR | Status: DC | PRN

## 2012-08-11 MED ORDER — PROPOFOL 10 MG/ML IV EMUL
INTRAVENOUS | Status: AC
  Filled 2012-08-11: qty 20

## 2012-08-11 MED ORDER — FENTANYL CITRATE 0.05 MG/ML IJ SOLN: 25.0000 ug | INTRAMUSCULAR | Status: DC | PRN

## 2012-08-11 SURGICAL SUPPLY — 20 items
ABLATOR ENDOMETRIAL BIPOLAR (ABLATOR) IMPLANT
CANISTER SUCTION 2500CC (MISCELLANEOUS) ×2 IMPLANT
CATH ROBINSON RED A/P 16FR (CATHETERS) ×2 IMPLANT
CLOTH BEACON ORANGE TIMEOUT ST (SAFETY) ×2 IMPLANT
CONTAINER PREFILL 10% NBF 60ML (FORM) ×4 IMPLANT
CORD ACTIVE DISPOSABLE (ELECTRODE)
CORD ELECTRO ACTIVE DISP (ELECTRODE) IMPLANT
DRESSING TELFA 8X3 (GAUZE/BANDAGES/DRESSINGS) ×2 IMPLANT
ELECT LOOP GYNE PRO 24FR (CUTTING LOOP)
ELECT REM PT RETURN 9FT ADLT (ELECTROSURGICAL) ×2
ELECTRODE LOOP GYNE PRO 24FR (CUTTING LOOP) IMPLANT
ELECTRODE REM PT RTRN 9FT ADLT (ELECTROSURGICAL) ×1 IMPLANT
GLOVE BIO SURGEON STRL SZ 6.5 (GLOVE) ×2 IMPLANT
GLOVE BIOGEL PI IND STRL 7.0 (GLOVE) ×1 IMPLANT
GLOVE BIOGEL PI INDICATOR 7.0 (GLOVE) ×1
GOWN STRL REIN XL XLG (GOWN DISPOSABLE) ×4 IMPLANT
PACK HYSTEROSCOPY LF (CUSTOM PROCEDURE TRAY) ×2 IMPLANT
PAD OB MATERNITY 4.3X12.25 (PERSONAL CARE ITEMS) ×2 IMPLANT
TOWEL OR 17X24 6PK STRL BLUE (TOWEL DISPOSABLE) ×4 IMPLANT
WATER STERILE IRR 1000ML POUR (IV SOLUTION) ×2 IMPLANT

## 2012-08-11 NOTE — Interval H&P Note (Signed)
History and Physical Interval Note:  08/11/2012 11:11 AM  Joan Becker  has presented today for surgery, with the diagnosis of  Dysfunctional uterine bleeding & endometrial abnormality  The various methods of treatment have been discussed with the patient and family. After consideration of risks, benefits and other options for treatment, the patient has consented to  Procedure(s): DILATATION AND CURETTAGE /HYSTEROSCOPY (N/A) as a surgical intervention .  The patient's history has been reviewed, patient examined, no change in status, stable for surgery.  I have reviewed the patient's chart and labs.  Questions were answered to the patient's satisfaction.   Feels well after transfusion last week, scant bleeding now, risks and benefits discussed and consent signed  Lyndell Allaire

## 2012-08-11 NOTE — Transfer of Care (Signed)
Immediate Anesthesia Transfer of Care Note  Patient: Joan Becker  Procedure(s) Performed: Procedure(s): DILATATION AND CURETTAGE /HYSTEROSCOPY (N/A)  Patient Location: PACU  Anesthesia Type:General  Level of Consciousness: awake, alert  and oriented  Airway & Oxygen Therapy: Patient Spontanous Breathing and Patient connected to nasal cannula oxygen  Post-op Assessment: Report given to PACU RN and Post -op Vital signs reviewed and stable  Post vital signs: Reviewed and stable  Complications: No apparent anesthesia complications

## 2012-08-11 NOTE — Op Note (Signed)
Procedure: Diagnostic hysteroscopy and dilation and curettage Preoperative diagnosis: Dysfunctional uterine bleeding and ultrasound findings of thickened endometrium Postoperative diagnosis: Polypoid endometrial lesions Surgeon: Dr. Scheryl Darter Anesthesia: LMA Estimated blood loss: Less than 50 mL Specimen: Endometrial curettings Complications: None Drains: None Counts: Correct  Patient gave written consent for hysteroscopy and dilation and curettage due to dysfunctional uterine bleeding and ultrasound that showed thickened endometrium. Patient identification was confirmed she brought the operating room and general seizure was induced. She's placed in dorsal lithotomy position. Perineum and vagina were sterilely prepped and draped. Bladder was drained with a red rubber catheter. Exam revealed normal-size uterus no adnexal masses. Speculum was inserted and half percent Marcaine with 1 200,000 epinephrine was infiltrated for intracervical block. Cervix was grasped with single-tooth tenaculum. Uterus sounded to 11 cm Cervix was dilated sufficiently to pass the diagnostic scope with 1.5% glycine used. The year camera was used. Both tubal ostia were visualized. Were polypoid-appearing lesions on the anterior surface of the uterine cavity extending to the left side. Cervix was dilated sufficiently to pass a sharp curette and curettage was performed with tissue obtained. All quadrants were scraped. Hysteroscope was reinserted and appeared that a representative specimen had been obtained. Is only approximately 100 mL fluid deficit during the procedure. All instruments were removed. There was minimal bleeding at the end of the procedure. Patient tolerated the procedure well without complications and she was brought in stable condition to PACU.   Dr. Scheryl Darter 08/11/2012 12:12 PM

## 2012-08-11 NOTE — Anesthesia Preprocedure Evaluation (Signed)
Anesthesia Evaluation  Patient identified by MRN, date of birth, ID band Patient awake    Reviewed: Allergy & Precautions, H&P , Patient's Chart, lab work & pertinent test results, reviewed documented beta blocker date and time   History of Anesthesia Complications Negative for: history of anesthetic complications  Airway Mallampati: III TM Distance: >3 FB Neck ROM: full    Dental no notable dental hx.    Pulmonary neg pulmonary ROS,  breath sounds clear to auscultation  Pulmonary exam normal       Cardiovascular Exercise Tolerance: Good negative cardio ROS  Rhythm:regular Rate:Normal     Neuro/Psych negative neurological ROS  negative psych ROS   GI/Hepatic negative GI ROS, Neg liver ROS,   Endo/Other  negative endocrine ROSHypothyroidism Morbid obesity  Renal/GU Renal diseasenegative Renal ROS     Musculoskeletal   Abdominal   Peds  Hematology negative hematology ROS (+)   Anesthesia Other Findings Thyroid disease     Hypothyroidism        Obesity     Kidney stones    Reproductive/Obstetrics negative OB ROS                           Anesthesia Physical Anesthesia Plan  ASA: III  Anesthesia Plan: General LMA   Post-op Pain Management:    Induction:   Airway Management Planned:   Additional Equipment:   Intra-op Plan:   Post-operative Plan:   Informed Consent: I have reviewed the patients History and Physical, chart, labs and discussed the procedure including the risks, benefits and alternatives for the proposed anesthesia with the patient or authorized representative who has indicated his/her understanding and acceptance.   Dental Advisory Given  Plan Discussed with: CRNA, Surgeon and Anesthesiologist  Anesthesia Plan Comments:         Anesthesia Quick Evaluation

## 2012-08-11 NOTE — H&P (View-Only) (Signed)
  History     CSN: [id number]  Arrival date and time: 08/03/12 1357   First Provider Initiated Contact with Patient 08/03/12 1451      Chief Complaint  Patient presents with  . Vaginal Bleeding   HPI G0P0 No LMP recorded. Patient with persistent DUB which was improved on OCP twice a day until yesterday, now heavy and she feels bad, chest"feels funny", no SOB or CP.  Scheduled for hysteroscopy and D and C 08/11/12, US showed abnormal endometrial thickening.  Pertinent Gynecological History: Menses: flow is moderate Bleeding: dysfunctional uterine bleeding Contraception: OCP (estrogen/progesterone) DES exposure: denies Blood transfusions: none Sexually transmitted diseases: no past history Previous GYN Procedures:    Clomid cycles 09/2011 Dr. Rivard Last pap: normal    Past Medical History  Diagnosis Date  . Thyroid disease   . Hypothyroidism   . Obesity   . Kidney stones     Past Surgical History  Procedure Laterality Date  . Tonsillectomy    . Adenoidectomy    . Wisdom tooth extraction      Family History  Problem Relation Age of Onset  . Hearing loss Maternal Aunt   . Hearing loss Maternal Uncle   . Hearing loss Paternal Aunt   . Hearing loss Paternal Uncle   . Hearing loss Maternal Grandmother   . Hearing loss Maternal Grandfather   . Hearing loss Paternal Grandmother   . Hearing loss Paternal Grandfather     History  Substance Use Topics  . Smoking status: Never Smoker   . Smokeless tobacco: Never Used  . Alcohol Use: No    Allergies: No Known Allergies  Prescriptions prior to admission  Medication Sig Dispense Refill  . ferrous sulfate 325 (65 FE) MG tablet Take 325 mg by mouth daily with breakfast.      . ibuprofen (ADVIL,MOTRIN) 200 MG tablet Take 600 mg by mouth every 6 (six) hours as needed for pain.      . levothyroxine (SYNTHROID, LEVOTHROID) 175 MCG tablet Take 1 tablet (175 mcg total) by mouth daily.  30 tablet  11  . norethindrone-ethinyl  estradiol 1/35 (ORTHO-NOVUM 1/35, 28,) tablet 2 tablets daily until bleeding stops then 1 daily  2 Package  6    Review of Systems  Respiratory:       Chest feels funny.  Cardiovascular: Negative.   Gastrointestinal: Negative for nausea.  Neurological: Positive for weakness.   Physical Exam   Blood pressure 79/57, pulse 84, temperature 98.3 F (36.8 C), temperature source Oral, resp. rate 18, height 5' 7.5" (1.715 m), weight 296 lb (134.265 kg).  Physical Exam  Constitutional: No distress.  obese  Cardiovascular: Normal rate.   Murmur (flow murmur) heard. Respiratory: Effort normal and breath sounds normal. No respiratory distress.  GI: Soft. She exhibits no distension and no mass.  Genitourinary:  deferred  Skin: Skin is warm and dry.  Psychiatric: She has a normal mood and affect. Her behavior is normal.    MAU Course  Procedures  MDM complex  Assessment and Plan  Symptomatic anemia due to DUB unresponsive to HRC with OCP. Abnormal pelvic US suspicious for hyperplasia.  Will transfuse 3 units PRBC and give Premarin IV 25 mg. Risk and benefit of transfusion explained and she accepts.  ARNOLD,JAMES 08/03/2012, 3:00 PM  

## 2012-08-12 ENCOUNTER — Encounter (HOSPITAL_COMMUNITY): Payer: Self-pay | Admitting: Obstetrics & Gynecology

## 2012-08-13 ENCOUNTER — Inpatient Hospital Stay (HOSPITAL_COMMUNITY)
Admission: AD | Admit: 2012-08-13 | Discharge: 2012-08-13 | Disposition: A | Discharge status: Home / Self Care | Payer: Self-pay | Source: Ambulatory Visit | Attending: Family Medicine | Admitting: Family Medicine

## 2012-08-13 DIAGNOSIS — IMO0002 Reserved for concepts with insufficient information to code with codable children: Secondary | ICD-10-CM | POA: Insufficient documentation

## 2012-08-13 DIAGNOSIS — N938 Other specified abnormal uterine and vaginal bleeding: Secondary | ICD-10-CM

## 2012-08-13 DIAGNOSIS — N949 Unspecified condition associated with female genital organs and menstrual cycle: Secondary | ICD-10-CM

## 2012-08-13 DIAGNOSIS — Y838 Other surgical procedures as the cause of abnormal reaction of the patient, or of later complication, without mention of misadventure at the time of the procedure: Secondary | ICD-10-CM | POA: Insufficient documentation

## 2012-08-13 LAB — CBC
HCT: 31.7 % — ABNORMAL LOW (ref 36.0–46.0)
Hemoglobin: 9 g/dL — ABNORMAL LOW (ref 12.0–15.0)
MCV: 70.9 fL — ABNORMAL LOW (ref 78.0–100.0)
RDW: 24.2 % — ABNORMAL HIGH (ref 11.5–15.5)
WBC: 6.4 10*3/uL (ref 4.0–10.5)

## 2012-08-13 MED ORDER — KETOROLAC TROMETHAMINE 10 MG PO TABS: 10.0000 mg | ORAL_TABLET | Freq: Four times a day (QID) | ORAL | Status: DC | PRN

## 2012-08-13 NOTE — MAU Note (Signed)
Pt states she had a surgical procedure done on Thur to remove a polyp-states she spotted on Thur and then on Fri night and this morning , her bleeding has increased and was passing some clots and "peices of clots" and the cramping got worse

## 2012-08-13 NOTE — MAU Provider Note (Signed)
History     CSN: [id number]  Arrival date and time: 08/13/12 1900   First Provider Initiated Contact with Patient 08/13/12 1935      Chief Complaint  Patient presents with  . Vaginal Bleeding   HPI Ms. Joan Becker is a 27 y.o. G0P0 who presents to MAU 2 days post-op from [facility] hysteroscopy with vaginal bleeding. The patient states that she had spotting following the procedure and started having heavier bleeding last night and today. The bleeding has been waxing and waning all day, but she states that she filled a pad in the 30 minute drive here from home tonight. She has been passing some clots, most are small. The bleeding can be heavy, but she states "nowhere near what she was having prior to surgery." She is having lower abdominal and low back pain that she rates at 7/10 now. She last took Percocet at 2pm. She states that the pain is getting worse. She has been on 2 OCPs daily since surgery and before. She denies weakness, dizziness or lightheadedness today.   OB History   Grav Para Term Preterm Abortions TAB SAB Ect Mult Living   0               Past Medical History  Diagnosis Date  . Thyroid disease   . Hypothyroidism   . Obesity   . Kidney stones     Past Surgical History  Procedure Laterality Date  . Tonsillectomy    . Adenoidectomy    . Wisdom tooth extraction    . Hysteroscopy w/d&c N/A 08/11/2012    Procedure: DILATATION AND CURETTAGE /HYSTEROSCOPY;  Surgeon: Adam Phenix, MD;  Location: [facility];  Service: Gynecology;  Laterality: N/A;    Family History  Problem Relation Age of Onset  . Hearing loss Maternal Aunt   . Hearing loss Maternal Uncle   . Hearing loss Paternal Aunt   . Hearing loss Paternal Uncle   . Hearing loss Maternal Grandmother   . Hearing loss Maternal Grandfather   . Hearing loss Paternal Grandmother   . Hearing loss Paternal Grandfather     History  Substance Use Topics  . Smoking status: Never Smoker   . Smokeless tobacco: Never  Used  . Alcohol Use: No    Allergies: No Known Allergies  Prescriptions prior to admission  Medication Sig Dispense Refill  . aspirin 81 MG chewable tablet Chew 81 mg by mouth daily as needed for pain.      . ferrous sulfate 325 (65 FE) MG tablet Take 325 mg by mouth daily with breakfast.      . HYDROcodone-acetaminophen (VICODIN) 5-500 MG per tablet Take 1-2 tablets by mouth every 6 (six) hours as needed for pain.  12 tablet  0  . ibuprofen (ADVIL,MOTRIN) 200 MG tablet Take 600 mg by mouth every 6 (six) hours as needed for pain.      Marland Kitchen levothyroxine (SYNTHROID, LEVOTHROID) 175 MCG tablet Take 1 tablet (175 mcg total) by mouth daily.  30 tablet  11  . norethindrone-ethinyl estradiol 1/35 (ORTHO-NOVUM, NORTREL,CYCLAFEM) tablet Take 2 tablets by mouth daily. 2 tablets daily until bleeding stops then 1 daily        Review of Systems  Constitutional: Negative for fever.  Gastrointestinal: Positive for abdominal pain. Negative for nausea and vomiting.  Genitourinary:       + vaginal bleeding   Neurological: Negative for dizziness, loss of consciousness and weakness.   Physical Exam   Blood pressure  140/72, pulse 79, temperature 98.5 F (36.9 C), temperature source Oral, resp. rate 20, height 5' 7.5" (1.715 m), weight 300 lb 8 oz (136.306 kg), SpO2 100.00%.  Physical Exam  Constitutional: She is oriented to person, place, and time. She appears well-developed and well-nourished. No distress.  HENT:  Head: Normocephalic and atraumatic.  Cardiovascular: Normal rate, regular rhythm and normal heart sounds.   Respiratory: Effort normal and breath sounds normal. No respiratory distress.  GI: Soft. Bowel sounds are normal. She exhibits no distension and no mass. There is tenderness. There is no rebound (mild tenderness to palpation in the LLQ and suprapubic region at the midline) and no guarding.  Genitourinary: Uterus is tender. Uterus is not enlarged. Cervix exhibits no motion tenderness, no  discharge and no friability. Right adnexum displays tenderness. Right adnexum displays no mass. Left adnexum displays tenderness. Left adnexum displays no mass. There is bleeding (small amount of blood noted in the vaginal vault, no clots) around the vagina. No erythema or tenderness around the vagina.  Neurological: She is alert and oriented to person, place, and time.  Skin: Skin is warm and dry. No erythema.  Psychiatric: She has a normal mood and affect.   Results for orders placed during the hospital encounter of 08/13/12 (from the past 24 hour(s))  CBC     Status: Abnormal   Collection Time    08/13/12  7:36 PM      Result Value Range   WBC 6.4  4.0 - 10.5 K/uL   RBC 4.47  3.87 - 5.11 MIL/uL   Hemoglobin 9.0 (*) 12.0 - 15.0 g/dL   HCT 16.1 (*) 09.6 - 04.5 %   MCV 70.9 (*) 78.0 - 100.0 fL   MCH 20.1 (*) 26.0 - 34.0 pg   MCHC 28.4 (*) 30.0 - 36.0 g/dL   RDW 40.9 (*) 81.1 - 91.4 %   Platelets 305  150 - 400 K/uL    MAU Course  Procedures None  MDM Discussed with Dr. Shawnie Pons. Patient should take Vicodin and Toradol for pain. Stay on OCPs until follow-up appointment. Keep follow-up appointment. Monitor bleeding.   Assessment and Plan  A: Post-operative bleeding s/p hysteroscopy D&C  P: Discharge home Rx for Toradol sent to patient's pharmacy Bleeding precautions discussed Patient encouraged to keep follow-up in [facility] clinic as scheduled Patient may return to MAU as needed or if her condition were to change or worsen  Freddi Starr, PA-C  08/13/2012, 7:47 PM

## 2012-08-14 NOTE — MAU Provider Note (Signed)
Chart reviewed and agree with management and plan.  

## 2012-08-15 ENCOUNTER — Telehealth: Payer: Self-pay | Admitting: *Deleted

## 2012-08-15 ENCOUNTER — Ambulatory Visit (INDEPENDENT_AMBULATORY_CARE_PROVIDER_SITE_OTHER): Payer: Self-pay | Admitting: Obstetrics & Gynecology

## 2012-08-15 ENCOUNTER — Inpatient Hospital Stay (HOSPITAL_COMMUNITY)
Admission: AD | Admit: 2012-08-15 | Discharge: 2012-08-15 | Disposition: A | Discharge status: Hospice | Payer: Self-pay | Source: Ambulatory Visit | Attending: Obstetrics & Gynecology | Admitting: Obstetrics & Gynecology

## 2012-08-15 ENCOUNTER — Encounter: Payer: Self-pay | Admitting: Obstetrics & Gynecology

## 2012-08-15 ENCOUNTER — Encounter (HOSPITAL_COMMUNITY): Payer: Self-pay | Admitting: *Deleted

## 2012-08-15 ENCOUNTER — Other Ambulatory Visit: Payer: Self-pay | Admitting: Obstetrics & Gynecology

## 2012-08-15 VITALS — BP 138/96 | HR 109 | Temp 97.0°F | Ht 67.0 in | Wt 294.4 lb

## 2012-08-15 DIAGNOSIS — N949 Unspecified condition associated with female genital organs and menstrual cycle: Secondary | ICD-10-CM

## 2012-08-15 DIAGNOSIS — N938 Other specified abnormal uterine and vaginal bleeding: Secondary | ICD-10-CM

## 2012-08-15 DIAGNOSIS — E282 Polycystic ovarian syndrome: Secondary | ICD-10-CM | POA: Insufficient documentation

## 2012-08-15 DIAGNOSIS — N920 Excessive and frequent menstruation with regular cycle: Secondary | ICD-10-CM | POA: Insufficient documentation

## 2012-08-15 DIAGNOSIS — N840 Polyp of corpus uteri: Secondary | ICD-10-CM | POA: Insufficient documentation

## 2012-08-15 LAB — POCT URINALYSIS DIP (DEVICE)
Bilirubin Urine: NEGATIVE
Ketones, ur: NEGATIVE mg/dL
Protein, ur: 100 mg/dL — AB
Specific Gravity, Urine: 1.02 (ref 1.005–1.030)
pH: 7 (ref 5.0–8.0)

## 2012-08-15 LAB — CBC
HCT: 33.4 % — ABNORMAL LOW (ref 36.0–46.0)
Hemoglobin: 9.9 g/dL — ABNORMAL LOW (ref 12.0–15.0)
MCH: 20 pg — ABNORMAL LOW (ref 26.0–34.0)
MCHC: 29.6 g/dL — ABNORMAL LOW (ref 30.0–36.0)
MCV: 67.3 fL — ABNORMAL LOW (ref 78.0–100.0)
RDW: 25.1 % — ABNORMAL HIGH (ref 11.5–15.5)

## 2012-08-15 MED ORDER — LEUPROLIDE ACETATE (3 MONTH) 11.25 MG IM KIT: 11.2500 mg | PACK | Freq: Once | INTRAMUSCULAR | Status: DC

## 2012-08-15 MED ORDER — ESTROGENS CONJUGATED 25 MG IJ SOLR
25.0000 mg | Freq: Once | INTRAMUSCULAR | Status: AC
  Administered 2012-08-15: 25 mg via INTRAVENOUS
  Filled 2012-08-15: qty 25

## 2012-08-15 MED ORDER — HYDROCODONE-ACETAMINOPHEN 5-500 MG PO TABS: 1.0000 | ORAL_TABLET | Freq: Four times a day (QID) | ORAL | Status: DC | PRN

## 2012-08-15 MED ORDER — ESTROGENS CONJUGATED 25 MG IJ SOLR: 25.0000 mg | Freq: Once | INTRAMUSCULAR | Status: DC

## 2012-08-15 MED ORDER — SODIUM CHLORIDE 0.9 % IV BOLUS (SEPSIS)
500.0000 mL | Freq: Once | INTRAVENOUS | Status: AC
  Administered 2012-08-15: 500 mL via INTRAVENOUS

## 2012-08-15 MED ORDER — MEDROXYPROGESTERONE ACETATE 5 MG PO TABS: 10.0000 mg | ORAL_TABLET | Freq: Every day | ORAL | Status: DC

## 2012-08-15 NOTE — Progress Notes (Signed)
Went to MAU on 4/5 for a lot of vaginal bleeding post surgery, was passing clots at the time. Back today because still bleeding a lot, states "she just feels bad and not right and knows something is wrong." states she is still passing a lot of clots and had to change her clothes 10 times between Saturday & Sunday. States she does have some painful urination and difficulty voiding. Post op appt for 4/21 was cancelled   Still bleeding  NAD, not pale  Pelvic: moderate blood in vault, not tender, uterus nl  CBC    Component Value Date/Time   WBC 6.4 08/13/2012 1936   RBC 4.47 08/13/2012 1936   HGB 9.0* 08/13/2012 1936   HCT 31.7* 08/13/2012 1936   PLT 305 08/13/2012 1936   MCV 70.9* 08/13/2012 1936   MCH 20.1* 08/13/2012 1936   MCHC 28.4* 08/13/2012 1936   RDW 24.2* 08/13/2012 1936   LYMPHSABS 1.8 12/02/2006 1243   MONOABS 0.5 12/02/2006 1243   EOSABS 0.1 12/02/2006 1243   BASOSABS 0.0 12/02/2006 1243    Path D&C benign polyps  Imp: DUB, polyps  Plan Premarin 25 mg IV now  Lupron depot 11.25 an Provera PO  Adam Phenix, MD 08/15/2012

## 2012-08-16 ENCOUNTER — Telehealth: Payer: Self-pay | Admitting: *Deleted

## 2012-08-16 DIAGNOSIS — N938 Other specified abnormal uterine and vaginal bleeding: Secondary | ICD-10-CM

## 2012-08-16 MED ORDER — HYDROCODONE-ACETAMINOPHEN 5-325 MG PO TABS: ORAL_TABLET | ORAL | Status: DC

## 2012-08-16 NOTE — Telephone Encounter (Signed)
Received call from [facility] pharmacist stating that the Rx for Vicodin 5/500 is no longer available. I gave telephone authorization to substitute with Vicodin 5/325 using the same administration instructions and quantity.

## 2012-08-17 NOTE — Telephone Encounter (Signed)
Erroneous encounter-disregard

## 2012-08-19 NOTE — Anesthesia Postprocedure Evaluation (Signed)
  Anesthesia Post Note  Patient: Joan Becker  Procedure(s) Performed: Procedure(s) (LRB): DILATATION AND CURETTAGE /HYSTEROSCOPY (N/A)  Anesthesia type: GA  Patient location: PACU  Post pain: Pain level controlled  Post assessment: Post-op Vital signs reviewed  Last Vitals:  Filed Vitals:   08/11/12 1300  BP:   Pulse: 61  Temp: 36.7 C  Resp: 21    Post vital signs: Reviewed  Level of consciousness: sedated  Complications: No apparent anesthesia complications

## 2012-08-23 ENCOUNTER — Telehealth: Payer: Self-pay | Admitting: *Deleted

## 2012-08-23 NOTE — Telephone Encounter (Signed)
Pt left message stating that she is still having bleeding and the provera is giving her headaches. She is requesting to have the IV medication to control her bleeding. She may be reached @ [phone] before 1130 and [phone] after 1130 today.

## 2012-08-24 ENCOUNTER — Telehealth: Payer: Self-pay

## 2012-08-24 NOTE — Telephone Encounter (Signed)
Patient left message that she was bleeding and passing clots.  She is at work today and we are able to call her at [phone].

## 2012-08-24 NOTE — Telephone Encounter (Signed)
Per note in epic patient spoke with Gaylyn Rong.

## 2012-08-24 NOTE — Telephone Encounter (Signed)
Patient states she is still bleeding with clots post D & C (4/3/4); changing 2 saturated pads q1hr. She was given Premarin 25 mg IV, Lupron 11.25mg  IM and Provera PO 5mg . She states she is annoyed because the bleeding has not stopped and she just does not feel good. Advised patient to go to MAU since she is feeling generalized malaise and changing 2 saturated pads qhr. Patient demanded if she could just come here and get Dr. Debroah Loop to give her the Premarin IV again because she cannot afford to keep going to MAU. Responded to patient that Dr. Debroah Loop is not in the clinic today; I can send him a message to seek advice. Patient says never mind and hanged up.

## 2012-08-29 ENCOUNTER — Ambulatory Visit: Payer: Self-pay | Admitting: Obstetrics & Gynecology

## 2012-09-01 ENCOUNTER — Encounter: Payer: Self-pay | Admitting: *Deleted

## 2012-09-30 ENCOUNTER — Ambulatory Visit: Payer: Self-pay | Admitting: Obstetrics & Gynecology

## 2012-11-20 ENCOUNTER — Emergency Department (INDEPENDENT_AMBULATORY_CARE_PROVIDER_SITE_OTHER)
Admission: EM | Admit: 2012-11-20 | Discharge: 2012-11-20 | Disposition: A | Discharge status: Home / Self Care | Payer: Self-pay | Source: Home / Self Care

## 2012-11-20 ENCOUNTER — Encounter (HOSPITAL_COMMUNITY): Payer: Self-pay | Admitting: *Deleted

## 2012-11-20 DIAGNOSIS — H5789 Other specified disorders of eye and adnexa: Secondary | ICD-10-CM

## 2012-11-20 DIAGNOSIS — H579 Unspecified disorder of eye and adnexa: Secondary | ICD-10-CM

## 2012-11-20 MED ORDER — TETRACAINE HCL 0.5 % OP SOLN
OPHTHALMIC | Status: AC
  Filled 2012-11-20: qty 2

## 2012-11-20 MED ORDER — ERYTHROMYCIN 5 MG/GM OP OINT: TOPICAL_OINTMENT | Freq: Four times a day (QID) | OPHTHALMIC | Status: DC

## 2012-11-20 NOTE — ED Notes (Signed)
Was diagnosed with left conjunctivitis on 7/9 - started Vigamox gtts and has been taking tid.  Left eye getting more red, swollen, tearing; started with left facial swelling, left tooth pain, left earache yesterday with significant worsening today.  Denies fevers.  Left eye very red, swollen, and inflamed.  Denies contact lens use.  Admits to blurred vision left eye.

## 2012-11-20 NOTE — ED Provider Notes (Signed)
Medical screening examination/treatment/procedure(s) were performed by a resident physician and as supervising physician I was immediately available for consultation/collaboration.  Leslee Home, M.D.  Reuben Likes, MD 11/20/12 [phone]

## 2012-11-20 NOTE — ED Provider Notes (Signed)
Joan Becker is a 27 y.o. female who presents to Urgent Care today for left eye irritation. Patient was diagnosed with conjunctivitis Wednesday. She has multiple family members who also or diagnosed with conjunctivitis. She was started on moxifloxacin eyedrops on Wednesday. Yesterday she developed worsening I. eructation with some pain. It again worsened in that today. She notes significant conjunctival injection with some discharge and pain. She denies any fevers or chills.  No history of glaucoma.    PMH reviewed. Hypothyroidism History  Substance Use Topics  . Smoking status: Never Smoker   . Smokeless tobacco: Never Used  . Alcohol Use: No   ROS as above Medications reviewed. Current Facility-Administered Medications  Medication Dose Route Frequency Provider Last Rate Last Dose  . leuprolide (LUPRON) injection 11.25 mg  11.25 mg Intramuscular Once Adam Phenix, MD       Current Outpatient Prescriptions  Medication Sig Dispense Refill  . levothyroxine (SYNTHROID, LEVOTHROID) 175 MCG tablet Take 1 tablet (175 mcg total) by mouth daily.  30 tablet  11  . erythromycin [facility]) ophthalmic ointment Place into the left eye every 6 (six) hours.  3.5 g  0  . ferrous sulfate 325 (65 FE) MG tablet Take 325 mg by mouth daily with breakfast.      . HYDROcodone-acetaminophen (NORCO/VICODIN) 5-325 MG per tablet Take 1-2 tablets by mouth every 6 hrs as needed for pain  12 tablet  0  . ketorolac (TORADOL) 10 MG tablet Take 1 tablet (10 mg total) by mouth every 6 (six) hours as needed for pain.  20 tablet  0  . medroxyPROGESTERone (PROVERA) 5 MG tablet Take 2 tablets (10 mg total) by mouth daily.  30 tablet  2    Exam:  BP 128/68  Pulse 70  Temp(Src) 97.8 F (36.6 C) (Oral)  Resp 16  SpO2 100%  LMP 11/09/2012 Gen: Well NAD HEENT: EOMI,  MMM. Left eye conjunctival injection pupils equal round reactive to light. Eye motion is normal and intact. Funduscopic exam is normal bilaterally.   Fluorescein staining shows uptake in the 9:00 position of the sclera.  Lungs: CTABL Nl WOB Heart: RRR no MRG Abd: NABS, NT, ND Exts: Non edematous BL  LE, warm and well perfused.   No results found for this or any previous visit (from the past 24 hour(s)). No results found.  Assessment and Plan: 27 y.o. female with likely drug reaction to moxifloxacin drops. Doubtful for scleritis.  Plan: Discussed options plan to discontinue moxifloxacin drops and start erythromycin ointment. Will followup with Dr. Karleen Hampshire ophthalmology tomorrow. If worsening presented to the emergency room.  Patient expresses understanding and agreement.     Rodolph Bong, MD 11/20/12 1700

## 2012-12-07 ENCOUNTER — Telehealth: Payer: Self-pay | Admitting: *Deleted

## 2012-12-07 NOTE — Telephone Encounter (Signed)
Message sent to Dr. Debroah Loop with inquiry if this pt requires additional injection of Lupron Depot.

## 2012-12-08 NOTE — Telephone Encounter (Signed)
Per Dr. Debroah Loop pt will need follow up before another injection is ordered.

## 2013-01-11 ENCOUNTER — Encounter: Payer: Self-pay | Admitting: *Deleted

## 2013-02-09 ENCOUNTER — Ambulatory Visit: Payer: Self-pay | Admitting: Obstetrics & Gynecology

## 2013-03-16 ENCOUNTER — Other Ambulatory Visit: Payer: Self-pay

## 2014-01-13 ENCOUNTER — Inpatient Hospital Stay (HOSPITAL_COMMUNITY): Payer: BC Managed Care – PPO

## 2014-01-13 ENCOUNTER — Encounter (HOSPITAL_COMMUNITY): Payer: Self-pay | Admitting: *Deleted

## 2014-01-13 ENCOUNTER — Inpatient Hospital Stay (HOSPITAL_COMMUNITY)
Admission: AD | Admit: 2014-01-13 | Discharge: 2014-01-13 | Disposition: A | Discharge status: Home / Self Care | Payer: BC Managed Care – PPO | Source: Ambulatory Visit | Attending: Obstetrics & Gynecology | Admitting: Obstetrics & Gynecology

## 2014-01-13 DIAGNOSIS — O209 Hemorrhage in early pregnancy, unspecified: Secondary | ICD-10-CM | POA: Diagnosis present

## 2014-01-13 DIAGNOSIS — O200 Threatened abortion: Secondary | ICD-10-CM | POA: Diagnosis not present

## 2014-01-13 LAB — URINE MICROSCOPIC-ADD ON

## 2014-01-13 LAB — URINALYSIS, ROUTINE W REFLEX MICROSCOPIC
Bilirubin Urine: NEGATIVE
Glucose, UA: NEGATIVE mg/dL
KETONES UR: NEGATIVE mg/dL
NITRITE: NEGATIVE
PH: 6 (ref 5.0–8.0)
Protein, ur: NEGATIVE mg/dL
SPECIFIC GRAVITY, URINE: 1.025 (ref 1.005–1.030)
Urobilinogen, UA: 0.2 mg/dL (ref 0.0–1.0)

## 2014-01-13 NOTE — Discharge Instructions (Signed)
Pelvic Rest °Pelvic rest is sometimes recommended for women when:  °· The placenta is partially or completely covering the opening of the cervix (placenta previa). °· There is bleeding between the uterine wall and the amniotic sac in the first trimester (subchorionic hemorrhage). °· The cervix begins to open without labor starting (incompetent cervix, cervical insufficiency). °· The labor is too early (preterm labor). °HOME CARE INSTRUCTIONS °· Do not have sexual intercourse, stimulation, or an orgasm. °· Do not use tampons, douche, or put anything in the vagina. °· Do not lift anything over 10 pounds (4.5 kg). °· Avoid strenuous activity or straining your pelvic muscles. °SEEK MEDICAL CARE IF:  °· You have any vaginal bleeding during pregnancy. Treat this as a potential emergency. °· You have cramping pain felt low in the stomach (stronger than menstrual cramps). °· You notice vaginal discharge (watery, mucus, or bloody). °· You have a low, dull backache. °· There are regular contractions or uterine tightening. °SEEK IMMEDIATE MEDICAL CARE IF: °You have vaginal bleeding and have placenta previa.  °Document Released: 08/22/2010 Document Revised: 07/20/2011 Document Reviewed: 08/22/2010 °ExitCare® Patient Information ©2015 [employer]. This information is not intended to replace advice given to you by your health care provider. Make sure you discuss any questions you have with your health care provider. °Vaginal Bleeding During Pregnancy, First Trimester °A small amount of bleeding (spotting) from the vagina is relatively common in early pregnancy. It usually stops on its own. Various things may cause bleeding or spotting in early pregnancy. Some bleeding may be related to the pregnancy, and some may not. In most cases, the bleeding is normal and is not a problem. However, bleeding can also be a sign of something serious. Be sure to tell your health care provider about any vaginal bleeding right away. °Some  possible causes of vaginal bleeding during the first trimester include: °· Infection or inflammation of the cervix. °· Growths (polyps) on the cervix. °· Miscarriage or threatened miscarriage. °· Pregnancy tissue has developed outside of the uterus and in a fallopian tube (tubal pregnancy). °· Tiny cysts have developed in the uterus instead of pregnancy tissue (molar pregnancy). °HOME CARE INSTRUCTIONS  °Watch your condition for any changes. The following actions may help to lessen any discomfort you are feeling: °· Follow your health care provider's instructions for limiting your activity. If your health care provider orders bed rest, you may need to stay in bed and only get up to use the bathroom. However, your health care provider may allow you to continue light activity. °· If needed, make plans for someone to help with your regular activities and responsibilities while you are on bed rest. °· Keep track of the number of pads you use each day, how often you change pads, and how soaked (saturated) they are. Write this down. °· Do not use tampons. Do not douche. °· Do not have sexual intercourse or orgasms until approved by your health care provider. °· If you pass any tissue from your vagina, save the tissue so you can show it to your health care provider. °· Only take over-the-counter or prescription medicines as directed by your health care provider. °· Do not take aspirin because it can make you bleed. °· Keep all follow-up appointments as directed by your health care provider. °SEEK MEDICAL CARE IF: °· You have any vaginal bleeding during any part of your pregnancy. °· You have cramps or labor pains. °· You have a fever, not controlled by medicine. °SEEK IMMEDIATE MEDICAL   CARE IF:  °· You have severe cramps in your back or belly (abdomen). °· You pass large clots or tissue from your vagina. °· Your bleeding increases. °· You feel light-headed or weak, or you have fainting episodes. °· You have chills. °· You  are leaking fluid or have a gush of fluid from your vagina. °· You pass out while having a bowel movement. °MAKE SURE YOU: °· Understand these instructions. °· Will watch your condition. °· Will get help right away if you are not doing well or get worse. °Document Released: 02/04/2005 Document Revised: 05/02/2013 Document Reviewed: 01/02/2013 °ExitCare® Patient Information ©2015 [employer]. This information is not intended to replace advice given to you by your health care provider. Make sure you discuss any questions you have with your health care provider. ° °

## 2014-01-13 NOTE — MAU Provider Note (Signed)
History     CSN: [id number]  Arrival date and time: 01/13/14 0831 Provider notified: 0908 Provider at bedside: 1050     Chief Complaint  Patient presents with  . Vaginal Bleeding   HPI  Joan Becker is a 28 yo G1P0 female at [redacted] wks gestation with complaints of bleeding this morning.  She denies VB now.  She has had a ultrasound at 5 wks that showed a live IUP, but no HR.  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  Past Medical History  Diagnosis Date  . Thyroid disease   . Hypothyroidism   . Obesity   . Kidney stones     Past Surgical History  Procedure Laterality Date  . Tonsillectomy    . Adenoidectomy    . Wisdom tooth extraction    . Hysteroscopy w/d&c N/A 08/11/2012    Procedure: DILATATION AND CURETTAGE /HYSTEROSCOPY;  Surgeon: Adam Phenix, MD;  Location: [facility];  Service: Gynecology;  Laterality: N/A;  . Myringotomy      Family History  Problem Relation Age of Onset  . Hearing loss Maternal Aunt   . Hearing loss Maternal Uncle   . Hearing loss Paternal Aunt   . Hearing loss Paternal Uncle   . Hearing loss Maternal Grandmother   . Hearing loss Maternal Grandfather   . Hearing loss Paternal Grandmother   . Hearing loss Paternal Grandfather     History  Substance Use Topics  . Smoking status: Never Smoker   . Smokeless tobacco: Never Used  . Alcohol Use: No    Allergies: No Known Allergies  Facility-administered medications prior to admission  Medication Dose Route Frequency Provider Last Rate Last Dose  . leuprolide (LUPRON) injection 11.25 mg  11.25 mg Intramuscular Once Adam Phenix, MD       Prescriptions prior to admission  Medication Sig Dispense Refill  . cephALEXin (KEFLEX) 500 MG capsule Take 500 mg by mouth 2 (two) times daily.      . Cholecalciferol (VITAMIN D3) 1000 UNITS CAPS Take 1 capsule by mouth daily.      Marland Kitchen levothyroxine (SYNTHROID, LEVOTHROID) 200 MCG tablet Take 200 mcg  by mouth daily before breakfast.      . metFORMIN (GLUCOPHAGE) 500 MG tablet Take 500 mg by mouth at bedtime.      . Prenatal Vit-Fe Fumarate-FA (PRENATAL MULTIVITAMIN) TABS tablet Take 1 tablet by mouth daily at 12 noon.      Marland Kitchen levothyroxine (SYNTHROID, LEVOTHROID) 175 MCG tablet Take 1 tablet (175 mcg total) by mouth daily.  30 tablet  11    Review of Systems  Constitutional: Negative.   HENT: Negative.   Eyes: Negative.   Respiratory: Negative.   Cardiovascular: Negative.   Gastrointestinal: Negative.   Genitourinary: Negative.   Musculoskeletal: Negative.   Skin: Negative.   Neurological: Negative.   Endo/Heme/Allergies: Negative.   Psychiatric/Behavioral: Negative.    OB Ultrasound < 14 wks CLINICAL DATA: Six weeks pregnant with bleeding.  EXAM: OBSTETRIC <14 WK [place] AND TRANSVAGINAL OB US  TECHNIQUE: Both transabdominal and transvaginal ultrasound examinations were performed for complete evaluation of the gestation as well as the maternal uterus, adnexal regions, and pelvic cul-de-sac. Transvaginal technique was performed to assess early pregnancy.  COMPARISON: Pelvic ultrasound of 06/21/2012  FINDINGS: Intrauterine gestational sac: Visualized/normal in shape.  Yolk sac: Present  Embryo: Present  Cardiac Activity: Present  Heart Rate: 122 bpm  CRL: 9  mm 7 w 0 d [place] EDC: 09/01/2014  Maternal uterus/adnexae: No evidence of subchorionic hemorrhage. Normal right ovary. Left ovarian corpus luteal cyst incidentally noted. Trace free pelvic fluid is likely physiologic.  IMPRESSION: 1. Intrauterine pregnancy of approximately 7 weeks 0 days with fetal heart rate of 122 beats per min. 2. No evidence of subchorionic hemorrhage. 3. Left ovarian corpus luteal cyst.  Physical Exam   Blood pressure 134/69, pulse 84, temperature 98.1 F (36.7 C), resp. rate 18, last menstrual period 11/13/2013.  Physical Exam  Constitutional: She is oriented to person, place, and  time. She appears well-developed and well-nourished.  HENT:  Head: Normocephalic and atraumatic.  Eyes: Pupils are equal, round, and reactive to light.  Neck: Normal range of motion. Neck supple.  Cardiovascular: Normal rate, regular rhythm and normal heart sounds.   Respiratory: Effort normal and breath sounds normal.  GI: Soft. Bowel sounds are normal.  Genitourinary:  No active bleeding; scant light tan colored d/c noted; difficult to assess uterine size d/t maternal habitus  Musculoskeletal: Normal range of motion.  Neurological: She is alert and oriented to person, place, and time.  Skin: Skin is warm and dry.  Psychiatric: She has a normal mood and affect. Her behavior is normal. Judgment and thought content normal.    MAU Course  Procedures OB U/S <14 wks SSE  Assessment and Plan  28 yo G1P0 @ 7.[redacted] wks gestation Bleeding during pregnancy Threatened Miscarriage  Bleeding Precautions reviewed Pelvic Rest Limit lifting to 25 lbs or less Keep scheduled sono appointment 9/10 Call the office for bleeding and/or cramping  Kenard Gower MSN, CNM 01/13/2014, 11:17 AM

## 2014-01-13 NOTE — MAU Note (Signed)
Pt presents to MAU with complaints of  Dark red vaginal bleeding that started this morning. The bleeding has slowed down since she arrived but reports lower abdominal cramping.

## 2014-01-21 ENCOUNTER — Inpatient Hospital Stay (HOSPITAL_COMMUNITY)
Admission: AD | Admit: 2014-01-21 | Discharge: 2014-01-21 | Disposition: A | Discharge status: Home / Self Care | Payer: BC Managed Care – PPO | Source: Ambulatory Visit | Attending: Obstetrics and Gynecology | Admitting: Obstetrics and Gynecology

## 2014-01-21 ENCOUNTER — Encounter (HOSPITAL_COMMUNITY): Payer: Self-pay | Admitting: *Deleted

## 2014-01-21 DIAGNOSIS — E039 Hypothyroidism, unspecified: Secondary | ICD-10-CM | POA: Insufficient documentation

## 2014-01-21 DIAGNOSIS — N898 Other specified noninflammatory disorders of vagina: Secondary | ICD-10-CM | POA: Diagnosis present

## 2014-01-21 DIAGNOSIS — M549 Dorsalgia, unspecified: Secondary | ICD-10-CM

## 2014-01-21 DIAGNOSIS — O9989 Other specified diseases and conditions complicating pregnancy, childbirth and the puerperium: Principal | ICD-10-CM

## 2014-01-21 DIAGNOSIS — O99891 Other specified diseases and conditions complicating pregnancy: Secondary | ICD-10-CM | POA: Insufficient documentation

## 2014-01-21 LAB — URINALYSIS, ROUTINE W REFLEX MICROSCOPIC
Bilirubin Urine: NEGATIVE
Glucose, UA: NEGATIVE mg/dL
Ketones, ur: NEGATIVE mg/dL
LEUKOCYTES UA: NEGATIVE
Nitrite: NEGATIVE
PH: 7.5 (ref 5.0–8.0)
Protein, ur: NEGATIVE mg/dL
Specific Gravity, Urine: 1.01 (ref 1.005–1.030)
Urobilinogen, UA: 1 mg/dL (ref 0.0–1.0)

## 2014-01-21 LAB — URINE MICROSCOPIC-ADD ON

## 2014-01-21 NOTE — MAU Note (Signed)
Two Tuesday tested and treated for UTI.  Then found out no bacteria had grown.  Then on last Saturday seen for bleeding, no concerns. This past Thursday went into office for regularly scheduled  U/s -- WNL, discussed back pain but all normal.  Noticed greenish discharge on Friday, medication called into pharmacy.  1st dose 10 pm tonight.   Back has increased since Thursday, and tonight has been the worse and unable to urinate without pushing really hard.

## 2014-01-21 NOTE — MAU Provider Note (Signed)
  History    CSN: [id number]  Arrival date and time: 01/21/14 0137 Nurse notification of patient @ 0235 Provider here to evaluate @ 0235   Chief Complaint  Patient presents with  . Back Pain  . Vaginal Discharge   HPI Back pain Vaginal discharge - being treated for vaginal infection  Past Medical History  Diagnosis Date  . Thyroid disease   . Hypothyroidism   . Obesity   . Kidney stones    Past Surgical History  Procedure Laterality Date  . Tonsillectomy    . Adenoidectomy    . Wisdom tooth extraction    . Hysteroscopy w/d&c N/A 08/11/2012    Procedure: DILATATION AND CURETTAGE /HYSTEROSCOPY;  Surgeon: Adam Phenix, MD;  Location: [facility];  Service: Gynecology;  Laterality: N/A;  . Myringotomy     Family History  Problem Relation Age of Onset  . Hearing loss Maternal Aunt   . Hearing loss Maternal Uncle   . Hearing loss Paternal Aunt   . Hearing loss Paternal Uncle   . Hearing loss Maternal Grandmother   . Hearing loss Maternal Grandfather   . Hearing loss Paternal Grandmother   . Hearing loss Paternal Grandfather    History  Substance Use Topics  . Smoking status: Never Smoker   . Smokeless tobacco: Never Used  . Alcohol Use: No   Allergies: No Known Allergies  Prescriptions prior to admission  Medication Sig Dispense Refill  . cephALEXin (KEFLEX) 500 MG capsule Take 500 mg by mouth 2 (two) times daily.      . Cholecalciferol (VITAMIN D3) 1000 UNITS CAPS Take 1 capsule by mouth daily.      Marland Kitchen levothyroxine (SYNTHROID, LEVOTHROID) 200 MCG tablet Take 200 mcg by mouth daily before breakfast.      . metFORMIN (GLUCOPHAGE) 500 MG tablet Take 500 mg by mouth at bedtime.      . Prenatal Vit-Fe Fumarate-FA (PRENATAL MULTIVITAMIN) TABS tablet Take 1 tablet by mouth daily at 12 noon.      Marland Kitchen levothyroxine (SYNTHROID, LEVOTHROID) 175 MCG tablet Take 1 tablet (175 mcg total) by mouth daily.  30 tablet  11   ROS No bleeding + vaginal discharge - using vaginal  medication Backache  Physical Exam   Blood pressure 130/70, pulse 77, temperature 98.1 F (36.7 C), resp. rate 18, height  (1.702 m), weight 114.034 kg (251 lb 6.4 oz), last menstrual period 11/13/2013.  Physical Exam  Alert and oriented times 4 in NAD No CVA tenderness Abdomen soft non-tender non-distended  Urinalysis and urine culture sent - Pending RESULT  MAU Course  Procedures  - NONE  Assessment and Plan  Pregnant @ 8weeks Backache  1) DC home 2) Reviewed comfort measures:         Tylenol for discomfort           increase water hydration - at least 6 servings per day           heating pad to back low x 10-15 minutes 3) CALL prior to any MAU/ ED visit 4) complete medication for vaginitis as prescribed   Marlinda Mike 01/21/2014, 2:34 AM

## 2014-01-22 LAB — URINE CULTURE
Colony Count: NO GROWTH
Culture: NO GROWTH

## 2014-01-24 LAB — OB RESULTS CONSOLE GBS: STREP GROUP B AG: POSITIVE

## 2014-01-30 ENCOUNTER — Other Ambulatory Visit: Payer: Self-pay

## 2014-01-30 LAB — OB RESULTS CONSOLE RPR: RPR: NONREACTIVE

## 2014-01-30 LAB — OB RESULTS CONSOLE ANTIBODY SCREEN: Antibody Screen: POSITIVE

## 2014-01-30 LAB — OB RESULTS CONSOLE HEPATITIS B SURFACE ANTIGEN: HEP B S AG: NEGATIVE

## 2014-01-30 LAB — OB RESULTS CONSOLE HIV ANTIBODY (ROUTINE TESTING): HIV: NONREACTIVE

## 2014-01-30 LAB — OB RESULTS CONSOLE ABO/RH: RH TYPE: POSITIVE

## 2014-01-30 LAB — OB RESULTS CONSOLE RUBELLA ANTIBODY, IGM: Rubella: IMMUNE

## 2014-02-08 LAB — OB RESULTS CONSOLE GC/CHLAMYDIA
Chlamydia: NEGATIVE
Gonorrhea: NEGATIVE

## 2014-02-16 ENCOUNTER — Ambulatory Visit (HOSPITAL_COMMUNITY)
Admission: RE | Admit: 2014-02-16 | Discharge: 2014-02-16 | Disposition: A | Discharge status: Home / Self Care | Payer: BC Managed Care – PPO | Source: Ambulatory Visit | Attending: Obstetrics | Admitting: Obstetrics

## 2014-02-16 ENCOUNTER — Encounter (HOSPITAL_COMMUNITY): Payer: Self-pay

## 2014-02-16 VITALS — BP 118/67 | HR 80

## 2014-02-16 DIAGNOSIS — E039 Hypothyroidism, unspecified: Secondary | ICD-10-CM | POA: Diagnosis not present

## 2014-02-16 DIAGNOSIS — Z3A11 11 weeks gestation of pregnancy: Secondary | ICD-10-CM | POA: Insufficient documentation

## 2014-02-16 DIAGNOSIS — O99281 Endocrine, nutritional and metabolic diseases complicating pregnancy, first trimester: Secondary | ICD-10-CM | POA: Diagnosis present

## 2014-02-16 DIAGNOSIS — E282 Polycystic ovarian syndrome: Secondary | ICD-10-CM | POA: Insufficient documentation

## 2014-02-16 DIAGNOSIS — O361911 Maternal care for other isoimmunization, first trimester, fetus 1: Secondary | ICD-10-CM

## 2014-02-16 HISTORY — DX: Polycystic ovarian syndrome: E28.2

## 2014-02-16 NOTE — Consult Note (Signed)
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  Ms. Caroleen HammanRumley reports a history of menorrhagia - required a D&C and was transfused with several units of PRBCs several years ago.  Her prenatal course has also been complicated by a history of polycystic ovarian syndrome and hypothyroidism.  She is without complaints today.  OB History: OB History   Grav Para Term Preterm Abortions TAB SAB Ect Mult Living   1 0 0 0 0 0 0 0 0 0       PMH:  Past Medical History  Diagnosis Date  . Thyroid disease   . Hypothyroidism   . Obesity   . Kidney stones   . PCOS (polycystic ovarian syndrome)     PSH:  Past Surgical History  Procedure Laterality Date  . Tonsillectomy    . Adenoidectomy    . Wisdom tooth extraction    . Hysteroscopy w/d&c N/A 08/11/2012    Procedure: DILATATION AND CURETTAGE /HYSTEROSCOPY;  Surgeon: Adam PhenixJames G Arnold, MD;  Location: [facility];  Service: Gynecology;  Laterality: N/A;  . Myringotomy     Meds:  Current Outpatient Prescriptions on File Prior to Encounter  Medication Sig Dispense Refill  . cephALEXin (KEFLEX) 500 MG capsule Take 500 mg by mouth 2 (two) times daily.      . Cholecalciferol (VITAMIN D3) 1000 UNITS CAPS Take 1 capsule by mouth daily.      Marland Kitchen. levothyroxine (SYNTHROID, LEVOTHROID) 175 MCG tablet Take 1 tablet (175 mcg total) by mouth daily.  30 tablet  11  . levothyroxine (SYNTHROID, LEVOTHROID) 200 MCG tablet Take 200 mcg by mouth daily before breakfast.      . metFORMIN (GLUCOPHAGE) 500 MG tablet Take 500 mg by mouth at bedtime.      . Prenatal Vit-Fe Fumarate-FA (PRENATAL MULTIVITAMIN) TABS tablet Take 1 tablet by mouth daily at 12 noon.       No current facility-administered medications on file prior to  encounter.   Allergies: No Known Allergies  FH:  Family History  Problem Relation Age of Onset  . Hearing loss Maternal Aunt   . Hearing loss Maternal Uncle   . Hearing loss Paternal Aunt   . Hearing loss Paternal Uncle   . Hearing loss Maternal Grandmother   . Hearing loss Maternal Grandfather   . Hearing loss Paternal Grandmother   . Hearing loss Paternal Grandfather    Soc:  History   Social History  . Marital Status: Married    Spouse Name: N/A    Number of Children: N/A  . Years of Education: N/A   Occupational History  . Not on file.   Social History Main Topics  . Smoking status: Never Smoker   . Smokeless tobacco: Never Used  . Alcohol Use: No  . Drug Use: No  . Sexual Activity: Not on file   Other Topics Concern  . Not on file   Social History Narrative  . No narrative on file    Review of Systems: no vaginal bleeding or cramping/contractions, no LOF, no nausea/vomiting. 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  The patient was likely to have been sensitized from her previous blood transfusions.  Fya can be associated with hemolytic disease of the newborn.  If the father of the baby is negative for Fya antigens, the risk to the fetus is essentially negligible.  After our conversation, blood work was sent on the father of the baby to his antigen status.  If the father of the baby is Fya positive, the patient will need MCA Doppler screening beginning at approximately 20-[redacted] weeks gestation.  While there is a risk of hemolytic disease with Duffy antibodies, at her current titer feel that there is a low risk that intrauterine transfusion would be required.  We will contact the patient and arrange follow up (if needed) once the father's antigen testing  returns.          3) Hypothyroidism - the patient reports that her synthroid dose was recently increased to 225 mcg daily based on a mildly elevated TSH.  Recommend that TSH be checked monthly until the values are within a normal range - would check at least every trimester of her pregnancy thereafter.   Thank you for the opportunity to be a part of the care of [name]. Please contact our office if we can be of further assistance.   I spent approximately 30 minutes with this patient with over 50% of time spent in face-to-face counseling.  Alpha GulaPaul Shiza Thelen, MD Maternal Fetal Medicine

## 2014-02-27 ENCOUNTER — Telehealth (HOSPITAL_COMMUNITY): Payer: Self-pay | Admitting: *Deleted

## 2014-02-27 NOTE — Telephone Encounter (Signed)
Called and spoke with Dr. Elpidio EricFogleman's nurse French Anaracy.  Notified that patient's FOB came back with +Fya antigen.  Patient will need MCA dopplers starting at 18 weeks. We would be happy to schedule that here at our office if Dr. Ernestina PennaFogleman would like.  Please let us know and we will call the patient with the result and recommendations.  Judi SaaGave Tracy my contact number to call back with this information.

## 2014-02-28 ENCOUNTER — Telehealth (HOSPITAL_COMMUNITY): Payer: Self-pay | Admitting: *Deleted

## 2014-02-28 NOTE — Telephone Encounter (Signed)
Called patient with FOB's duffy lab results being positive.  Explained to patient that she will need to come in at 18 weeks for a detailed anatomy scan and begin weekly MCA dopplers.  Pt verbalized understanding.  An appointment was made for November 23rd at 0830 am.  Encouraged patient to call back with any questions or concerns.

## 2014-03-08 ENCOUNTER — Other Ambulatory Visit (HOSPITAL_COMMUNITY): Payer: Self-pay | Admitting: Obstetrics

## 2014-03-08 DIAGNOSIS — O361921 Maternal care for other isoimmunization, second trimester, fetus 1: Secondary | ICD-10-CM

## 2014-03-12 ENCOUNTER — Encounter (HOSPITAL_COMMUNITY): Payer: Self-pay

## 2014-04-02 ENCOUNTER — Ambulatory Visit (HOSPITAL_COMMUNITY)
Admission: RE | Admit: 2014-04-02 | Discharge: 2014-04-02 | Disposition: A | Discharge status: Home / Self Care | Payer: BC Managed Care – PPO | Source: Ambulatory Visit | Attending: Obstetrics | Admitting: Obstetrics

## 2014-04-02 ENCOUNTER — Encounter (HOSPITAL_COMMUNITY): Payer: Self-pay

## 2014-04-02 DIAGNOSIS — O99282 Endocrine, nutritional and metabolic diseases complicating pregnancy, second trimester: Secondary | ICD-10-CM | POA: Diagnosis not present

## 2014-04-02 DIAGNOSIS — O361921 Maternal care for other isoimmunization, second trimester, fetus 1: Secondary | ICD-10-CM

## 2014-04-02 DIAGNOSIS — E079 Disorder of thyroid, unspecified: Secondary | ICD-10-CM

## 2014-04-02 DIAGNOSIS — Z36 Encounter for antenatal screening of mother: Secondary | ICD-10-CM | POA: Diagnosis not present

## 2014-04-02 DIAGNOSIS — Z3A18 18 weeks gestation of pregnancy: Secondary | ICD-10-CM | POA: Diagnosis not present

## 2014-04-02 DIAGNOSIS — E039 Hypothyroidism, unspecified: Secondary | ICD-10-CM | POA: Insufficient documentation

## 2014-04-02 DIAGNOSIS — O361990 Maternal care for other isoimmunization, unspecified trimester, not applicable or unspecified: Secondary | ICD-10-CM | POA: Insufficient documentation

## 2014-04-02 DIAGNOSIS — Z3689 Encounter for other specified antenatal screening: Secondary | ICD-10-CM | POA: Insufficient documentation

## 2014-04-10 ENCOUNTER — Telehealth (HOSPITAL_COMMUNITY): Payer: Self-pay | Admitting: *Deleted

## 2014-04-10 NOTE — Telephone Encounter (Signed)
Alfie called back about her lab results.  Instructed patient that her husband's genotype is FYA/B.  She has an appointment on 12/7 for dopplers.  Will discuss lab work further with MD at that time.

## 2014-04-10 NOTE — Telephone Encounter (Signed)
Left message to call back for lab results.

## 2014-04-16 ENCOUNTER — Ambulatory Visit (HOSPITAL_COMMUNITY)
Admission: RE | Admit: 2014-04-16 | Discharge: 2014-04-16 | Disposition: A | Discharge status: Home / Self Care | Payer: BC Managed Care – PPO | Source: Ambulatory Visit | Attending: Obstetrics | Admitting: Obstetrics

## 2014-04-16 ENCOUNTER — Other Ambulatory Visit (HOSPITAL_COMMUNITY): Payer: Self-pay | Admitting: Maternal and Fetal Medicine

## 2014-04-16 DIAGNOSIS — O99282 Endocrine, nutritional and metabolic diseases complicating pregnancy, second trimester: Secondary | ICD-10-CM | POA: Diagnosis not present

## 2014-04-16 DIAGNOSIS — O361921 Maternal care for other isoimmunization, second trimester, fetus 1: Secondary | ICD-10-CM

## 2014-04-16 DIAGNOSIS — E079 Disorder of thyroid, unspecified: Secondary | ICD-10-CM | POA: Insufficient documentation

## 2014-04-16 DIAGNOSIS — O361920 Maternal care for other isoimmunization, second trimester, not applicable or unspecified: Secondary | ICD-10-CM | POA: Insufficient documentation

## 2014-04-16 DIAGNOSIS — Z3A20 20 weeks gestation of pregnancy: Secondary | ICD-10-CM | POA: Insufficient documentation

## 2014-04-16 NOTE — Progress Notes (Signed)
Patient seen today  for follow up ultrasound.  See scanned ultrasound report in the Media tab in EPIC.  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  Father of the baby FyA and FyB heterozygous- patient declines amniocentesis to determine fetal genotype MCA Dopplers 0.8 MoM (unaffected) No signs of fetal hydrops Normal amniotic fluid volume  Recommendations: Recommend follow up ultrasound in 2 weeks for growth and MCA Doppler studies  Alpha GulaPaul Valin Massie, MD

## 2014-04-30 ENCOUNTER — Ambulatory Visit (HOSPITAL_COMMUNITY)
Admission: RE | Admit: 2014-04-30 | Discharge: 2014-04-30 | Disposition: A | Discharge status: Home / Self Care | Payer: BC Managed Care – PPO | Source: Ambulatory Visit | Attending: Obstetrics | Admitting: Obstetrics

## 2014-04-30 ENCOUNTER — Other Ambulatory Visit (HOSPITAL_COMMUNITY): Payer: Self-pay | Admitting: Maternal and Fetal Medicine

## 2014-04-30 DIAGNOSIS — O99282 Endocrine, nutritional and metabolic diseases complicating pregnancy, second trimester: Secondary | ICD-10-CM | POA: Insufficient documentation

## 2014-04-30 DIAGNOSIS — O99212 Obesity complicating pregnancy, second trimester: Secondary | ICD-10-CM | POA: Insufficient documentation

## 2014-04-30 DIAGNOSIS — E079 Disorder of thyroid, unspecified: Secondary | ICD-10-CM

## 2014-04-30 DIAGNOSIS — Z3A22 22 weeks gestation of pregnancy: Secondary | ICD-10-CM | POA: Insufficient documentation

## 2014-04-30 DIAGNOSIS — Z36 Encounter for antenatal screening of mother: Secondary | ICD-10-CM | POA: Diagnosis not present

## 2014-04-30 DIAGNOSIS — E039 Hypothyroidism, unspecified: Secondary | ICD-10-CM | POA: Insufficient documentation

## 2014-04-30 DIAGNOSIS — O361921 Maternal care for other isoimmunization, second trimester, fetus 1: Secondary | ICD-10-CM

## 2014-05-01 DIAGNOSIS — Z3A22 22 weeks gestation of pregnancy: Secondary | ICD-10-CM | POA: Insufficient documentation

## 2014-05-01 DIAGNOSIS — IMO0002 Reserved for concepts with insufficient information to code with codable children: Secondary | ICD-10-CM | POA: Insufficient documentation

## 2014-05-01 DIAGNOSIS — O361190 Maternal care for Anti-A sensitization, unspecified trimester, not applicable or unspecified: Secondary | ICD-10-CM | POA: Insufficient documentation

## 2014-05-01 DIAGNOSIS — Z0489 Encounter for examination and observation for other specified reasons: Secondary | ICD-10-CM | POA: Insufficient documentation

## 2014-05-11 HISTORY — PX: DILATION AND CURETTAGE OF UTERUS: SHX78

## 2014-05-11 NOTE — L&D Delivery Note (Signed)
Delivery Note At 12:10 PM a viable female was delivered via Vaginal, Spontaneous Delivery (Presentation: ; Occiput Anterior).  APGAR: 9, 9; weight  .  pending Placenta status: Intact, Spontaneous.  Cord: 3 vessels with the following complications: None.  Cord pH: not indicated  Anesthesia: Epidural  Episiotomy: None Lacerations: 2nd degree Suture Repair: 2.0 3.0 vicryl rapide standard fashion repair of 2nd degree vaginal laceration without perineal extension. Despite repair, continued bleeding from suture line, several additional figure of 8 sutures placed w/o good effect. Eventually, devers used for retraction and better exposure and sutures used for good hemostasis. Est. Blood Loss (mL): 500  Mom to postpartum.  Baby to Couplet care / Skin to Skin.  Katee Wentland A. 08/18/2014, 12:59 PM

## 2014-05-15 ENCOUNTER — Other Ambulatory Visit (HOSPITAL_COMMUNITY): Payer: Self-pay

## 2014-05-16 ENCOUNTER — Ambulatory Visit (HOSPITAL_COMMUNITY)
Admission: RE | Admit: 2014-05-16 | Discharge: 2014-05-16 | Disposition: A | Discharge status: Home / Self Care | Payer: BLUE CROSS/BLUE SHIELD | Source: Ambulatory Visit | Attending: Obstetrics | Admitting: Obstetrics

## 2014-05-16 ENCOUNTER — Encounter (HOSPITAL_COMMUNITY): Payer: Self-pay

## 2014-05-16 DIAGNOSIS — O361990 Maternal care for other isoimmunization, unspecified trimester, not applicable or unspecified: Secondary | ICD-10-CM | POA: Insufficient documentation

## 2014-05-16 DIAGNOSIS — E039 Hypothyroidism, unspecified: Secondary | ICD-10-CM | POA: Diagnosis not present

## 2014-05-16 DIAGNOSIS — O99282 Endocrine, nutritional and metabolic diseases complicating pregnancy, second trimester: Secondary | ICD-10-CM | POA: Diagnosis present

## 2014-05-16 DIAGNOSIS — O99212 Obesity complicating pregnancy, second trimester: Secondary | ICD-10-CM | POA: Insufficient documentation

## 2014-05-16 DIAGNOSIS — O361921 Maternal care for other isoimmunization, second trimester, fetus 1: Secondary | ICD-10-CM

## 2014-05-16 DIAGNOSIS — Z3A24 24 weeks gestation of pregnancy: Secondary | ICD-10-CM | POA: Insufficient documentation

## 2014-05-16 DIAGNOSIS — E079 Disorder of thyroid, unspecified: Secondary | ICD-10-CM

## 2014-05-30 ENCOUNTER — Encounter (HOSPITAL_COMMUNITY): Payer: Self-pay

## 2014-05-30 ENCOUNTER — Ambulatory Visit (HOSPITAL_COMMUNITY)
Admission: RE | Admit: 2014-05-30 | Discharge: 2014-05-30 | Disposition: A | Discharge status: Home / Self Care | Payer: BLUE CROSS/BLUE SHIELD | Source: Ambulatory Visit | Attending: Obstetrics | Admitting: Obstetrics

## 2014-05-30 DIAGNOSIS — Z3A26 26 weeks gestation of pregnancy: Secondary | ICD-10-CM | POA: Diagnosis not present

## 2014-05-30 DIAGNOSIS — O99282 Endocrine, nutritional and metabolic diseases complicating pregnancy, second trimester: Secondary | ICD-10-CM | POA: Diagnosis present

## 2014-05-30 DIAGNOSIS — E039 Hypothyroidism, unspecified: Secondary | ICD-10-CM | POA: Insufficient documentation

## 2014-05-30 DIAGNOSIS — O99212 Obesity complicating pregnancy, second trimester: Secondary | ICD-10-CM | POA: Insufficient documentation

## 2014-05-30 DIAGNOSIS — E079 Disorder of thyroid, unspecified: Secondary | ICD-10-CM

## 2014-05-30 DIAGNOSIS — O361921 Maternal care for other isoimmunization, second trimester, fetus 1: Secondary | ICD-10-CM

## 2014-05-30 DIAGNOSIS — R768 Other specified abnormal immunological findings in serum: Secondary | ICD-10-CM | POA: Insufficient documentation

## 2014-05-31 ENCOUNTER — Other Ambulatory Visit (HOSPITAL_COMMUNITY): Payer: Self-pay | Admitting: Obstetrics and Gynecology

## 2014-06-04 ENCOUNTER — Other Ambulatory Visit (HOSPITAL_COMMUNITY): Payer: Self-pay | Admitting: Maternal and Fetal Medicine

## 2014-06-04 DIAGNOSIS — O99212 Obesity complicating pregnancy, second trimester: Secondary | ICD-10-CM

## 2014-06-04 DIAGNOSIS — E039 Hypothyroidism, unspecified: Secondary | ICD-10-CM

## 2014-06-06 ENCOUNTER — Encounter (HOSPITAL_COMMUNITY): Payer: Self-pay

## 2014-06-06 ENCOUNTER — Inpatient Hospital Stay (HOSPITAL_COMMUNITY)
Admission: AD | Admit: 2014-06-06 | Discharge: 2014-06-07 | Disposition: A | Discharge status: Home / Self Care | Payer: BLUE CROSS/BLUE SHIELD | Source: Ambulatory Visit | Attending: Obstetrics & Gynecology | Admitting: Obstetrics & Gynecology

## 2014-06-06 DIAGNOSIS — Z3A27 27 weeks gestation of pregnancy: Secondary | ICD-10-CM

## 2014-06-06 DIAGNOSIS — Z87442 Personal history of urinary calculi: Secondary | ICD-10-CM

## 2014-06-06 DIAGNOSIS — R1031 Right lower quadrant pain: Secondary | ICD-10-CM

## 2014-06-06 DIAGNOSIS — N949 Unspecified condition associated with female genital organs and menstrual cycle: Secondary | ICD-10-CM

## 2014-06-06 DIAGNOSIS — O9989 Other specified diseases and conditions complicating pregnancy, childbirth and the puerperium: Secondary | ICD-10-CM | POA: Insufficient documentation

## 2014-06-06 DIAGNOSIS — O212 Late vomiting of pregnancy: Secondary | ICD-10-CM | POA: Insufficient documentation

## 2014-06-06 DIAGNOSIS — R109 Unspecified abdominal pain: Secondary | ICD-10-CM | POA: Diagnosis present

## 2014-06-06 DIAGNOSIS — N898 Other specified noninflammatory disorders of vagina: Secondary | ICD-10-CM | POA: Insufficient documentation

## 2014-06-06 DIAGNOSIS — O26899 Other specified pregnancy related conditions, unspecified trimester: Secondary | ICD-10-CM

## 2014-06-06 HISTORY — DX: Unspecified abdominal pain: R10.9

## 2014-06-06 HISTORY — DX: Other specified pregnancy related conditions, unspecified trimester: O26.899

## 2014-06-06 LAB — URINALYSIS, ROUTINE W REFLEX MICROSCOPIC
Bilirubin Urine: NEGATIVE
GLUCOSE, UA: NEGATIVE mg/dL
Hgb urine dipstick: NEGATIVE
Ketones, ur: NEGATIVE mg/dL
Nitrite: NEGATIVE
PH: 7 (ref 5.0–8.0)
Protein, ur: NEGATIVE mg/dL
SPECIFIC GRAVITY, URINE: 1.015 (ref 1.005–1.030)
Urobilinogen, UA: 0.2 mg/dL (ref 0.0–1.0)

## 2014-06-06 LAB — URINE MICROSCOPIC-ADD ON

## 2014-06-06 MED ORDER — PROMETHAZINE HCL 25 MG PO TABS
25.0000 mg | ORAL_TABLET | Freq: Once | ORAL | Status: AC
  Administered 2014-06-06: 25 mg via ORAL
  Filled 2014-06-06: qty 1

## 2014-06-06 MED ORDER — OXYCODONE-ACETAMINOPHEN 5-325 MG PO TABS
2.0000 | ORAL_TABLET | Freq: Once | ORAL | Status: AC
  Administered 2014-06-06: 2 via ORAL
  Filled 2014-06-06: qty 2

## 2014-06-06 NOTE — Discharge Instructions (Signed)
Abdominal Pain During Pregnancy Abdominal pain is common in pregnancy. Most of the time, it does not cause harm. There are many causes of abdominal pain. Some causes are more serious than others. Some of the causes of abdominal pain in pregnancy are easily diagnosed. Occasionally, the diagnosis takes time to understand. Other times, the cause is not determined. Abdominal pain can be a sign that something is very wrong with the pregnancy, or the pain may have nothing to do with the pregnancy at all. For this reason, always tell your health care provider if you have any abdominal discomfort. HOME CARE INSTRUCTIONS  Monitor your abdominal pain for any changes. The following actions may help to alleviate any discomfort you are experiencing:  Do not have sexual intercourse or put anything in your vagina until your symptoms go away completely.  Get plenty of rest until your pain improves.  Drink clear fluids if you feel nauseous. Avoid solid food as long as you are uncomfortable or nauseous.  Only take over-the-counter or prescription medicine as directed by your health care provider.  Keep all follow-up appointments with your health care provider. SEEK IMMEDIATE MEDICAL CARE IF:  You are bleeding, leaking fluid, or passing tissue from the vagina.  You have increasing pain or cramping.  You have persistent vomiting.  You have painful or bloody urination.  You have a fever.  You notice a decrease in your baby's movements.  You have extreme weakness or feel faint.  You have shortness of breath, with or without abdominal pain.  You develop a severe headache with abdominal pain.  You have abnormal vaginal discharge with abdominal pain.  You have persistent diarrhea.  You have abdominal pain that continues even after rest, or gets worse. MAKE SURE YOU:   Understand these instructions.  Will watch your condition.  Will get help right away if you are not doing well or get  worse. Document Released: 04/27/2005 Document Revised: 02/15/2013 Document Reviewed: 11/24/2012 [facility] Patient Information 2015 [employer], [place]. This information is not intended to replace advice given to you by your health care provider. Make sure you discuss any questions you have with your health care provider.  Round Ligament Pain During Pregnancy Round ligament pain is a sharp pain or jabbing feeling often felt in the lower belly or groin area on one or both sides. It is one of the most common complaints during pregnancy and is considered a normal part of pregnancy. It is most often felt during the second trimester.  Here is what you need to know about round ligament pain, including some tips to help you feel better.  Causes of Round Ligament Pain  Several thick ligaments surround and support your womb (uterus) as it grows during pregnancy. One of them is called the round ligament.  The round ligament connects the front part of the womb to your groin, the area where your legs attach to your pelvis. The round ligament normally tightens and relaxes slowly.  As your baby and womb grow, the round ligament stretches. That makes it more likely to become strained.  Sudden movements can cause the ligament to tighten quickly, like a rubber band snapping. This causes a sudden and quick jabbing feeling.  Symptoms of Round Ligament Pain  Round ligament pain can be concerning and uncomfortable. But it is considered normal as your body changes during pregnancy.  The symptoms of round ligament pain include a sharp, sudden spasm in the belly. It usually affects the right side, but it  may happen on both sides. The pain only lasts a few seconds.  Exercise may cause the pain, as will rapid movements such as:  sneezing coughing laughing rolling over in bed standing up too quickly

## 2014-06-06 NOTE — MAU Note (Signed)
Pt presents complaining of 2 episodes of vomiting once at 1900 and again at 1930 and had loose stool around 2040. Pt reports right sided pain that she thinks is muscular. Vomiting followed the pain. Denies vaginal bleeding or discharge. Reports good fetal movement.

## 2014-06-06 NOTE — MAU Provider Note (Signed)
History     CSN: [id number]  Arrival date and time: 06/06/14 2127 Provider at bedside: 2230    HPI  Ms. Joan Becker is a 29 yo G1P0 at 27.[redacted] wks gestation presenting with complaints of RLQ pain, vomiting x 2 episodes and a couple of loose stools since 1900.  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  She laid down and the pain got better, but then it increased soon after.  She then had 2 episodes of vomiting; which she "never gets sick".  Denies VB or LOF, although she has increased vaginal d/c.  (+) FM.  Past Medical History  Diagnosis Date  . Thyroid disease   . Hypothyroidism   . Obesity   . Kidney stones   . PCOS (polycystic ovarian syndrome)     Past Surgical History  Procedure Laterality Date  . Tonsillectomy    . Adenoidectomy    . Wisdom tooth extraction    . Hysteroscopy w/d&c N/A 08/11/2012    Procedure: DILATATION AND CURETTAGE /HYSTEROSCOPY;  Surgeon: Adam PhenixJames G Arnold, MD;  Location: [facility];  Service: Gynecology;  Laterality: N/A;  . Myringotomy      Family History  Problem Relation Age of Onset  . Hearing loss Maternal Aunt   . Hearing loss Maternal Uncle   . Hearing loss Paternal Aunt   . Hearing loss Paternal Uncle   . Hearing loss Maternal Grandmother   . Hearing loss Maternal Grandfather   . Hearing loss Paternal Grandmother   . Hearing loss Paternal Grandfather     History  Substance Use Topics  . Smoking status: Never Smoker   . Smokeless tobacco: Never Used  . Alcohol Use: No    Allergies: No Known Allergies  Prescriptions prior to admission  Medication Sig Dispense Refill Last Dose  . Cholecalciferol (VITAMIN D3) 1000 UNITS CAPS Take 1 capsule by mouth daily.   06/05/2014 at Unknown time  . levothyroxine (SYNTHROID, LEVOTHROID) 200 MCG tablet Take 200 mcg by mouth daily before breakfast. Take with 50mcg for a total of 250mcg daily.   06/06/2014 at Unknown time  . levothyroxine (SYNTHROID, LEVOTHROID) 50 MCG tablet Take  50 mcg by mouth daily before breakfast. Take with 200mcg for a total of 250mcg daily.   06/06/2014 at Unknown time  . Prenatal Vit-Fe Fumarate-FA (PRENATAL MULTIVITAMIN) TABS tablet Take 1 tablet by mouth daily at 12 noon.   06/05/2014 at Unknown time  . levothyroxine (SYNTHROID, LEVOTHROID) 175 MCG tablet Take 1 tablet (175 mcg total) by mouth daily. (Patient not taking: Reported on 06/06/2014) 30 tablet 11 08/15/2012 at Unknown    Review of Systems  Constitutional: Negative.   HENT: Negative.   Eyes: Negative.   Respiratory: Negative.   Cardiovascular: Negative.   Gastrointestinal: Positive for vomiting and abdominal pain.       Loose stool x 1; vomiting x 2 episodes  Genitourinary: Negative.   Musculoskeletal: Negative.   Skin: Negative.   Neurological: Negative.   Endo/Heme/Allergies: Negative.   Psychiatric/Behavioral: Negative.    NST: FHR: 145 bpm / moderate variability / accels present / no decels TOCO: UI noted  Physical Exam   Blood pressure 126/68, pulse 91, temperature 98 F (36.7 C), temperature source Oral, resp. rate 18, last menstrual period 11/13/2013.  Physical Exam  Constitutional: She is oriented to person, place, and time. She appears well-developed and well-nourished.  Morbidly obese  HENT:  Head: Normocephalic and atraumatic.  Eyes: Pupils are equal,  round, and reactive to light.  Neck: Normal range of motion.  Cardiovascular: Normal rate, regular rhythm and normal heart sounds.   Respiratory: Effort normal and breath sounds normal.  GI: Soft. Bowel sounds are normal.  Genitourinary:  Gravid; uterine size difficult to assess d/t body habitus; cx: closed/thick/high  Musculoskeletal: Normal range of motion.  Neurological: She is alert and oriented to person, place, and time. She has normal reflexes.  Skin: Skin is warm and dry.  Psychiatric: She has a normal mood and affect. Her behavior is normal. Judgment and thought content normal.    MAU Course   Procedures CCUA NST - reactive PO hydration Percocet 2 tabs po Phenergan 25 mg po Assessment and Plan  29 yo G1P0 with SIUP @ 27.[redacted] wks gestation Abdominal Pain in Pregnancy, unknown etiology vs. Round Ligament Pain - improved after Percocet Category 1 FHR tracing  Discharge Home Abdominal Pain in Pregnancy instructions Round Ligament Pain instrsuctions Buy Maternity Belt to wear at work [employer] until Saturday 1/30 F/U appt at [facility] Friday 1/29; need fFN - someone from the office will call to schedule   Kenard Gower, MSN, CNM 06/06/2014, 10:43 PM

## 2014-06-07 ENCOUNTER — Inpatient Hospital Stay (HOSPITAL_COMMUNITY)
Admission: AD | Admit: 2014-06-07 | Discharge: 2014-06-07 | Disposition: A | Discharge status: Home / Self Care | Payer: BLUE CROSS/BLUE SHIELD | Source: Ambulatory Visit | Attending: Obstetrics and Gynecology | Admitting: Obstetrics and Gynecology

## 2014-06-07 ENCOUNTER — Inpatient Hospital Stay (HOSPITAL_COMMUNITY): Payer: BLUE CROSS/BLUE SHIELD

## 2014-06-07 ENCOUNTER — Encounter (HOSPITAL_COMMUNITY): Payer: Self-pay | Admitting: *Deleted

## 2014-06-07 DIAGNOSIS — R112 Nausea with vomiting, unspecified: Secondary | ICD-10-CM

## 2014-06-07 DIAGNOSIS — R109 Unspecified abdominal pain: Secondary | ICD-10-CM

## 2014-06-07 DIAGNOSIS — R1110 Vomiting, unspecified: Secondary | ICD-10-CM

## 2014-06-07 DIAGNOSIS — O26899 Other specified pregnancy related conditions, unspecified trimester: Secondary | ICD-10-CM

## 2014-06-07 LAB — CBC WITH DIFFERENTIAL/PLATELET
Basophils Absolute: 0 10*3/uL (ref 0.0–0.1)
Basophils Relative: 0 % (ref 0–1)
Eosinophils Absolute: 0.1 10*3/uL (ref 0.0–0.7)
Eosinophils Relative: 0 % (ref 0–5)
HCT: 40.2 % (ref 36.0–46.0)
Hemoglobin: 13.3 g/dL (ref 12.0–15.0)
Lymphocytes Relative: 9 % — ABNORMAL LOW (ref 12–46)
Lymphs Abs: 1.2 10*3/uL (ref 0.7–4.0)
MCH: 29.2 pg (ref 26.0–34.0)
MCHC: 33.1 g/dL (ref 30.0–36.0)
MCV: 88.4 fL (ref 78.0–100.0)
Monocytes Absolute: 1.2 10*3/uL — ABNORMAL HIGH (ref 0.1–1.0)
Monocytes Relative: 10 % (ref 3–12)
Neutro Abs: 10.1 10*3/uL — ABNORMAL HIGH (ref 1.7–7.7)
Neutrophils Relative %: 81 % — ABNORMAL HIGH (ref 43–77)
Platelets: 166 10*3/uL (ref 150–400)
RBC: 4.55 MIL/uL (ref 3.87–5.11)
RDW: 14.6 % (ref 11.5–15.5)
WBC: 12.6 10*3/uL — ABNORMAL HIGH (ref 4.0–10.5)

## 2014-06-07 LAB — COMPREHENSIVE METABOLIC PANEL
ALT: 12 U/L (ref 0–35)
AST: 13 U/L (ref 0–37)
Albumin: 3.2 g/dL — ABNORMAL LOW (ref 3.5–5.2)
Alkaline Phosphatase: 55 U/L (ref 39–117)
Anion gap: 8 (ref 5–15)
BUN: 7 mg/dL (ref 6–23)
CO2: 20 mmol/L (ref 19–32)
Calcium: 9 mg/dL (ref 8.4–10.5)
Chloride: 110 mmol/L (ref 96–112)
Creatinine, Ser: 0.53 mg/dL (ref 0.50–1.10)
GFR calc Af Amer: >90 mL/min (ref >90)
GFR calc non Af Amer: >90 mL/min (ref >90)
Glucose, Bld: 79 mg/dL (ref 70–99)
Potassium: 3.9 mmol/L (ref 3.5–5.1)
Sodium: 138 mmol/L (ref 135–145)
Total Bilirubin: 0.6 mg/dL (ref 0.3–1.2)
Total Protein: 7 g/dL (ref 6.0–8.3)

## 2014-06-07 LAB — LIPASE, BLOOD: Lipase: 22 U/L (ref 11–59)

## 2014-06-07 LAB — AMYLASE: Amylase: 38 U/L (ref 0–105)

## 2014-06-07 MED ORDER — PROMETHAZINE HCL 25 MG/ML IJ SOLN
25.0000 mg | Freq: Four times a day (QID) | INTRAMUSCULAR | Status: DC | PRN
  Administered 2014-06-07: 25 mg via INTRAVENOUS
  Filled 2014-06-07: qty 1

## 2014-06-07 MED ORDER — OXYCODONE-ACETAMINOPHEN 5-325 MG PO TABS: 2.0000 | ORAL_TABLET | ORAL | Status: DC | PRN

## 2014-06-07 MED ORDER — MORPHINE SULFATE 4 MG/ML IJ SOLN
4.0000 mg | Freq: Once | INTRAMUSCULAR | Status: AC
  Administered 2014-06-07: 4 mg via INTRAVENOUS
  Filled 2014-06-07: qty 1

## 2014-06-07 MED ORDER — ZOLPIDEM TARTRATE 5 MG PO TABS: 5.0000 mg | ORAL_TABLET | Freq: Every evening | ORAL | Status: DC | PRN

## 2014-06-07 MED ORDER — LACTATED RINGERS IV SOLN
INTRAVENOUS | Status: DC
  Administered 2014-06-07: 15:00:00 via INTRAVENOUS

## 2014-06-07 NOTE — Progress Notes (Signed)
Dr. Ernestina PennaFogleman requests that ureteral jets be included in U/S report, U/S tech notified & will contact radiologist.

## 2014-06-07 NOTE — Progress Notes (Signed)
  Orders entered from verbal request from Dr Ernestina PennaFogleman - patient sent from office due to persistent nausea and vomiting with abdominal and back pain. Dr Ernestina PennaFogleman to be called for results - stated will see her after office.  Marlinda Mikeanya Gerarda Conklin CNm [facility]ACNM

## 2014-06-07 NOTE — H&P (Signed)
Chief complaint: Abdominal pain  History of present illness: This is a 29 year old G1 at 64 weeks and 5 days gestation who presents with acute onset abdominal pain starting yesterday afternoon. Patient notes several episodes of emesis and one episode of diarrhea yesterday. She presented to maternity admissions for evaluation last night. Had reactive NST, negative tocometry, cervical check which was long and closed. Patient was discharged home with Percocet patient states Percocet helped relieve pain some and able to sleep last night but woke up this morning in the pain was worse than ever. Patient has not had an appetite and has had nothing to eat since midnight last night. No bowel movement today. No blood per rectum. Patient notes constant abdominal pain with some waves of pain that intensified. Patient notes pain throughout lower abdomen with some radiation to bilateral flanks and right upper quadrant. Patient does not note any fever. Patient notes fetal movement as per usual. Patient is unsure of the feeling of contractions. Patient notes no dysuria and no blood in her urine. Patient does note discomfort in the position of sitting on the toilet and feels like she needs to strain to void. Patient does note increase in vaginal discharge and now with a new odor.  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 Patient has been followed by maternal fetal medicine with MCA Dopplers every 2 weeks and has had consistent reactive fetal testing with no evidence of hydrops. Diabetic screen 170 and 3 hour GTT pending.  Physical exam Filed Vitals:   06/07/14 1306  BP: 119/62  Pulse: 112  Temp: 98.9 F (37.2 C)  TempSrc: Oral  Resp: 20   Gen.: Appears uncomfortable Cardiovascular: Regular rate and rhythm Pulmonary: Clear to  auscultation bilaterally Skin: Warm and dry Back: No costovertebral angle tenderness Abdomen: Gravid with uterus above the level of the umbilicus, no specific uterine tenderness though patient with moderate tenderness on palpation to abdominal exam. No rebound, no guarding, no distention, nonsurgical abdomen. No right upper quadrant pain GU: Cervix long, closed. Malodorous leukorrhea discharge. No true cervical motion tenderness though mild peroneal tendinitis Toco: No contractions FH: 130s, 10 beat variability, +10 x 10 accelerations, no decelerations  Wet prep: With positive, clue positive, yeast positive, moderate white blood cells CBC    Component Value Date/Time   WBC 12.6* 06/07/2014 1412   RBC 4.55 06/07/2014 1412   HGB 13.3 06/07/2014 1412   HCT 40.2 06/07/2014 1412   PLT 166 06/07/2014 1412   MCV 88.4 06/07/2014 1412   MCH 29.2 06/07/2014 1412   MCHC 33.1 06/07/2014 1412   RDW 14.6 06/07/2014 1412   LYMPHSABS 1.2 06/07/2014 1412   MONOABS 1.2* 06/07/2014 1412   EOSABS 0.1 06/07/2014 1412   BASOSABS 0.0 06/07/2014 1412     Nl CMP Nl RUQ u/s, nl renal u/s w/o stone seen, no mass, no hydronephrosis, b/l jets seen Nl amylase/ lipase  A/P [redacted] wks pregnant with abd pain of unclear etiology - no evidence cholecystitis, gallstone, PTL, cervical dilation. No fever, no abnl leukocytosis, exam/ current sx do not suggest appendicitis. Etiology possible small renal stone (pt has had in past), viral GI. While PTL and appendicitis in DDx w/u has been reassuring.  Precautions reviewed. Leuks on UA but no other concerns for UTI, Cx pending, defer abx at this time. No CVAT. Hydration, pain meds. F/u early next  wk in office. - Reassuring fetal testing.   Tylin Force A. 06/07/2014 6:10 PM

## 2014-06-07 NOTE — Discharge Instructions (Signed)
°Abdominal Pain During Pregnancy °Abdominal pain is common in pregnancy. Most of the time, it does not cause harm. There are many causes of abdominal pain. Some causes are more serious than others. Some of the causes of abdominal pain in pregnancy are easily diagnosed. Occasionally, the diagnosis takes time to understand. Other times, the cause is not determined. Abdominal pain can be a sign that something is very wrong with the pregnancy, or the pain may have nothing to do with the pregnancy at all. For this reason, always tell your health care provider if you have any abdominal discomfort. °HOME CARE INSTRUCTIONS  °Monitor your abdominal pain for any changes. The following actions may help to alleviate any discomfort you are experiencing: °· Do not have sexual intercourse or put anything in your vagina until your symptoms go away completely. °· Get plenty of rest until your pain improves. °· Drink clear fluids if you feel nauseous. Avoid solid food as long as you are uncomfortable or nauseous. °· Only take over-the-counter or prescription medicine as directed by your health care provider. °· Keep all follow-up appointments with your health care provider. °SEEK IMMEDIATE MEDICAL CARE IF: °· You are bleeding, leaking fluid, or passing tissue from the vagina. °· You have increasing pain or cramping. °· You have persistent vomiting. °· You have painful or bloody urination. °· You have a fever. °· You notice a decrease in your baby's movements. °· You have extreme weakness or feel faint. °· You have shortness of breath, with or without abdominal pain. °· You develop a severe headache with abdominal pain. °· You have abnormal vaginal discharge with abdominal pain. °· You have persistent diarrhea. °· You have abdominal pain that continues even after rest, or gets worse. °MAKE SURE YOU:  °· Understand these instructions. °· Will watch your condition. °· Will get help right away if you are not doing well or get  worse. °Document Released: 04/27/2005 Document Revised: 02/15/2013 Document Reviewed: 11/24/2012 °ExitCare® Patient Information ©2015 [employer]. This information is not intended to replace advice given to you by your health care provider. Make sure you discuss any questions you have with your health care provider. ° °Kidney Stones °Kidney stones (urolithiasis) are deposits that form inside your kidneys. The intense pain is caused by the stone moving through the urinary tract. When the stone moves, the ureter goes into spasm around the stone. The stone is usually passed in the urine.  °CAUSES  °· A disorder that makes certain neck glands produce too much parathyroid hormone (primary hyperparathyroidism). °· A buildup of uric acid crystals, similar to gout in your joints. °· Narrowing (stricture) of the ureter. °· A kidney obstruction present at birth (congenital obstruction). °· Previous surgery on the kidney or ureters. °· Numerous kidney infections. °SYMPTOMS  °· Feeling sick to your stomach (nauseous). °· Throwing up (vomiting). °· Blood in the urine (hematuria). °· Pain that usually spreads (radiates) to the groin. °· Frequency or urgency of urination. °DIAGNOSIS  °· Taking a history and physical exam. °· Blood or urine tests. °· CT scan. °· Occasionally, an examination of the inside of the urinary bladder (cystoscopy) is performed. °TREATMENT  °· Observation. °· Increasing your fluid intake. °· Extracorporeal shock wave lithotripsy--This is a noninvasive procedure that uses shock waves to break up kidney stones. °· Surgery may be needed if you have severe pain or persistent obstruction. There are various surgical procedures. Most of the procedures are performed with the use of small instruments. Only small   incisions are needed to accommodate these instruments, so recovery time is minimized. °The size, location, and chemical composition are all important variables that will determine the proper choice of  action for you. Talk to your health care provider to better understand your situation so that you will minimize the risk of injury to yourself and your kidney.  °HOME CARE INSTRUCTIONS  °· Drink enough water and fluids to keep your urine clear or pale yellow. This will help you to pass the stone or stone fragments. °· Strain all urine through the provided strainer. Keep all particulate matter and stones for your health care provider to see. The stone causing the pain may be as small as a grain of salt. It is very important to use the strainer each and every time you pass your urine. The collection of your stone will allow your health care provider to analyze it and verify that a stone has actually passed. The stone analysis will often identify what you can do to reduce the incidence of recurrences. °· Only take over-the-counter or prescription medicines for pain, discomfort, or fever as directed by your health care provider. °· Make a follow-up appointment with your health care provider as directed. °· Get follow-up X-rays if required. The absence of pain does not always mean that the stone has passed. It may have only stopped moving. If the urine remains completely obstructed, it can cause loss of kidney function or even complete destruction of the kidney. It is your responsibility to make sure X-rays and follow-ups are completed. Ultrasounds of the kidney can show blockages and the status of the kidney. Ultrasounds are not associated with any radiation and can be performed easily in a matter of minutes. °SEEK MEDICAL CARE IF: °· You experience pain that is progressive and unresponsive to any pain medicine you have been prescribed. °SEEK IMMEDIATE MEDICAL CARE IF:  °· Pain cannot be controlled with the prescribed medicine. °· You have a fever or shaking chills. °· The severity or intensity of pain increases over 18 hours and is not relieved by pain medicine. °· You develop a new onset of abdominal pain. °· You feel  faint or pass out. °· You are unable to urinate. °MAKE SURE YOU:  °· Understand these instructions. °· Will watch your condition. °· Will get help right away if you are not doing well or get worse. °Document Released: 04/27/2005 Document Revised: 12/28/2012 Document Reviewed: 09/28/2012 °ExitCare® Patient Information ©2015 Exit[employer], LLC. This information is not intended to replace advice given to you by your health care provider. Make sure you discuss any questions you have with your health care provider. ° °

## 2014-06-07 NOTE — MAU Note (Signed)
Pain in abd (allover) and low back pain.  Pain is constant- started yesterday.  Also having sharp pain in abd.  ( pt grimacing when stood up and when sat down)

## 2014-06-13 ENCOUNTER — Encounter (HOSPITAL_COMMUNITY): Payer: Self-pay

## 2014-06-13 ENCOUNTER — Ambulatory Visit (HOSPITAL_COMMUNITY)
Admission: RE | Admit: 2014-06-13 | Discharge: 2014-06-13 | Disposition: A | Discharge status: Home / Self Care | Payer: BLUE CROSS/BLUE SHIELD | Source: Ambulatory Visit | Attending: Obstetrics | Admitting: Obstetrics

## 2014-06-13 DIAGNOSIS — Z3A28 28 weeks gestation of pregnancy: Secondary | ICD-10-CM | POA: Insufficient documentation

## 2014-06-13 DIAGNOSIS — O99213 Obesity complicating pregnancy, third trimester: Secondary | ICD-10-CM | POA: Insufficient documentation

## 2014-06-13 DIAGNOSIS — R768 Other specified abnormal immunological findings in serum: Secondary | ICD-10-CM

## 2014-06-13 DIAGNOSIS — O99283 Endocrine, nutritional and metabolic diseases complicating pregnancy, third trimester: Secondary | ICD-10-CM | POA: Diagnosis not present

## 2014-06-13 DIAGNOSIS — E039 Hypothyroidism, unspecified: Secondary | ICD-10-CM | POA: Diagnosis not present

## 2014-06-13 DIAGNOSIS — O99212 Obesity complicating pregnancy, second trimester: Secondary | ICD-10-CM | POA: Insufficient documentation

## 2014-06-15 ENCOUNTER — Ambulatory Visit (HOSPITAL_COMMUNITY): Payer: BLUE CROSS/BLUE SHIELD

## 2014-06-28 ENCOUNTER — Encounter (HOSPITAL_COMMUNITY): Payer: Self-pay

## 2014-06-28 ENCOUNTER — Ambulatory Visit (HOSPITAL_COMMUNITY)
Admission: RE | Admit: 2014-06-28 | Discharge: 2014-06-28 | Disposition: A | Discharge status: Home / Self Care | Payer: BLUE CROSS/BLUE SHIELD | Source: Ambulatory Visit | Attending: Family Medicine | Admitting: Family Medicine

## 2014-06-28 DIAGNOSIS — O24419 Gestational diabetes mellitus in pregnancy, unspecified control: Secondary | ICD-10-CM | POA: Diagnosis not present

## 2014-06-28 DIAGNOSIS — Z3A30 30 weeks gestation of pregnancy: Secondary | ICD-10-CM | POA: Insufficient documentation

## 2014-06-28 DIAGNOSIS — O99213 Obesity complicating pregnancy, third trimester: Secondary | ICD-10-CM | POA: Diagnosis not present

## 2014-06-28 DIAGNOSIS — R768 Other specified abnormal immunological findings in serum: Secondary | ICD-10-CM

## 2014-06-28 HISTORY — DX: Gestational diabetes mellitus in pregnancy, unspecified control: O24.419

## 2014-07-10 ENCOUNTER — Other Ambulatory Visit (HOSPITAL_COMMUNITY): Payer: Self-pay | Admitting: Maternal and Fetal Medicine

## 2014-07-10 ENCOUNTER — Ambulatory Visit (HOSPITAL_COMMUNITY)
Admission: RE | Admit: 2014-07-10 | Discharge: 2014-07-10 | Disposition: A | Discharge status: Home / Self Care | Payer: BLUE CROSS/BLUE SHIELD | Source: Ambulatory Visit | Attending: Obstetrics | Admitting: Obstetrics

## 2014-07-10 DIAGNOSIS — Z3A32 32 weeks gestation of pregnancy: Secondary | ICD-10-CM | POA: Diagnosis not present

## 2014-07-10 DIAGNOSIS — O99210 Obesity complicating pregnancy, unspecified trimester: Secondary | ICD-10-CM | POA: Insufficient documentation

## 2014-07-10 DIAGNOSIS — O99213 Obesity complicating pregnancy, third trimester: Secondary | ICD-10-CM | POA: Insufficient documentation

## 2014-07-10 DIAGNOSIS — O361930 Maternal care for other isoimmunization, third trimester, not applicable or unspecified: Secondary | ICD-10-CM | POA: Insufficient documentation

## 2014-07-10 DIAGNOSIS — O24419 Gestational diabetes mellitus in pregnancy, unspecified control: Secondary | ICD-10-CM | POA: Diagnosis not present

## 2014-07-10 DIAGNOSIS — R768 Other specified abnormal immunological findings in serum: Secondary | ICD-10-CM

## 2014-07-17 ENCOUNTER — Inpatient Hospital Stay (HOSPITAL_COMMUNITY)
Admission: AD | Admit: 2014-07-17 | Discharge: 2014-07-17 | Disposition: A | Discharge status: Home / Self Care | Payer: BLUE CROSS/BLUE SHIELD | Source: Ambulatory Visit | Attending: Obstetrics & Gynecology | Admitting: Obstetrics & Gynecology

## 2014-07-17 ENCOUNTER — Encounter (HOSPITAL_COMMUNITY): Payer: Self-pay | Admitting: *Deleted

## 2014-07-17 DIAGNOSIS — E039 Hypothyroidism, unspecified: Secondary | ICD-10-CM | POA: Insufficient documentation

## 2014-07-17 DIAGNOSIS — O24414 Gestational diabetes mellitus in pregnancy, insulin controlled: Secondary | ICD-10-CM | POA: Diagnosis not present

## 2014-07-17 DIAGNOSIS — H538 Other visual disturbances: Secondary | ICD-10-CM | POA: Diagnosis present

## 2014-07-17 DIAGNOSIS — R51 Headache: Secondary | ICD-10-CM | POA: Insufficient documentation

## 2014-07-17 DIAGNOSIS — Z3A33 33 weeks gestation of pregnancy: Secondary | ICD-10-CM | POA: Insufficient documentation

## 2014-07-17 DIAGNOSIS — O99213 Obesity complicating pregnancy, third trimester: Secondary | ICD-10-CM | POA: Diagnosis not present

## 2014-07-17 DIAGNOSIS — Z87442 Personal history of urinary calculi: Secondary | ICD-10-CM | POA: Diagnosis not present

## 2014-07-17 DIAGNOSIS — Z3490 Encounter for supervision of normal pregnancy, unspecified, unspecified trimester: Secondary | ICD-10-CM

## 2014-07-17 DIAGNOSIS — O99283 Endocrine, nutritional and metabolic diseases complicating pregnancy, third trimester: Secondary | ICD-10-CM | POA: Diagnosis not present

## 2014-07-17 LAB — URINALYSIS, ROUTINE W REFLEX MICROSCOPIC
Bilirubin Urine: NEGATIVE
Glucose, UA: NEGATIVE mg/dL
Hgb urine dipstick: NEGATIVE
Ketones, ur: NEGATIVE mg/dL
Nitrite: NEGATIVE
Protein, ur: NEGATIVE mg/dL
Specific Gravity, Urine: >1.03 — ABNORMAL HIGH (ref 1.005–1.030)
Urobilinogen, UA: 0.2 mg/dL (ref 0.0–1.0)
pH: 6 (ref 5.0–8.0)

## 2014-07-17 LAB — URINE MICROSCOPIC-ADD ON

## 2014-07-17 NOTE — Discharge Instructions (Signed)

## 2014-07-17 NOTE — MAU Note (Signed)
Wiliam Ke Bailey CNM given report about BP and symptoms patient is experiencing. Wiliam Ke Bailey CNM coming in to assess patient.

## 2014-07-17 NOTE — MAU Provider Note (Signed)
History     CSN: [id number]  Arrival date and time: 07/17/14 2154 Phone call from nurse @ 2237  First Provider Initiated Contact with Patient 07/17/14 2314      Chief Complaint  Patient presents with  . Headache  . Blurred Vision   HPI reports feeling poorly tonight ate (2015) but had dizziness / blurry vision / nausea / headache that started before Did not check BS at the time - thinks her last BS was 150-something earlier today but not sure of time States new BP cuff at home            took bp that was 230/150 but did not think it was right but came to hospital to be safe Pelvic pressure and backache - not sure if may be having labor signs + FM active this PM   Past Medical History  Diagnosis Date  . Thyroid disease   . Hypothyroidism   . Obesity   . Kidney stones   . PCOS (polycystic ovarian syndrome)   . Abdominal pain in pregnancy, antepartum 06/06/2014  . Gestational diabetes     Past Surgical History  Procedure Laterality Date  . Tonsillectomy    . Adenoidectomy    . Wisdom tooth extraction    . Hysteroscopy w/d&c N/A 08/11/2012    Procedure: DILATATION AND CURETTAGE /HYSTEROSCOPY;  Surgeon: Adam PhenixJames G Arnold, MD;  Location: [facility];  Service: Gynecology;  Laterality: N/A;  . Myringotomy      Family History  Problem Relation Age of Onset  . Hearing loss Maternal Aunt   . Hearing loss Maternal Uncle   . Hearing loss Paternal Aunt   . Hearing loss Paternal Uncle   . Hearing loss Maternal Grandmother   . Hearing loss Maternal Grandfather   . Hearing loss Paternal Grandmother   . Hearing loss Paternal Grandfather     History  Substance Use Topics  . Smoking status: Never Smoker   . Smokeless tobacco: Never Used  . Alcohol Use: No    Allergies: No Known Allergies  Prescriptions prior to admission  Medication Sig Dispense Refill Last Dose  . insulin glargine (LANTUS) 100 UNIT/ML injection Inject 12 Units into the skin at bedtime.     . Cholecalciferol  (VITAMIN D3) 1000 UNITS CAPS Take 1 capsule by mouth daily.   Taking  . GLYBURIDE PO Take by mouth.   Taking  . levothyroxine (SYNTHROID, LEVOTHROID) 200 MCG tablet Take 200 mcg by mouth daily before breakfast. Take with 50mcg for a total of 250mcg daily.   Taking  . levothyroxine (SYNTHROID, LEVOTHROID) 50 MCG tablet Take 50 mcg by mouth daily before breakfast. Take with 200mcg for a total of 250mcg daily.   Taking  . oxyCODONE-acetaminophen (ROXICET) 5-325 MG per tablet Take 2 tablets by mouth every 4 (four) hours as needed for severe pain. (Patient not taking: Reported on 06/13/2014) 30 tablet 0 Not Taking  . Prenatal Vit-Fe Fumarate-FA (PRENATAL MULTIVITAMIN) TABS tablet Take 1 tablet by mouth daily at 12 noon.   Taking  . zolpidem (AMBIEN) 5 MG tablet Take 1 tablet (5 mg total) by mouth at bedtime as needed for sleep. 7 tablet 0 Taking    ROS  Headache dull aching Some blurred vision early   Physical Exam   Blood pressure 119/68, pulse 97, temperature 98.4 F (36.9 C), temperature source Oral, resp. rate 20, height 5\' 7"  (1.702 m), weight 137.213 kg (302 lb 8 oz), last menstrual period 11/13/2013, SpO2 99 %.  BP 119/72 - 119/68 - 120/62 - 120/77 - 120/62 - 107/67 - 142/76  Physical Exam  Alert and oriented Abdomen soft and non-tender / uterus gravid NST reactive from 150 baseline / no decels Toco occasional rare ctx x 30 seconds Cervix closed long with vtx -3 ballotable Trace dependent edema - DTR 1+  MAU Course  Procedures  NST - reactive  Assessment and Plan  33.3 weeks GDMA2 Hypothyroidism obesity (+) antibody screen - Duffy  No evidence of PTL or PEC tonight Possibly glycemic etiology for symptoms as occurred suddenly this pm just prior to meal and resolved within 2 hours of meal BP reviewed instructions for taking BP at home - will bring cuff to next ROB for direct instructions  Recommend regular and small frequent meals - protein snack at HS tonight - check BS  before HS and again in AM - call if out of range   complete 24 hour urine as directed  return to office on Thursday with urine specimen / BP cuff / BS log  Reviewed ctx symptoms - late gestational discomfort versus labor signs to call  Tylenol PRN headache and mild discomforts  Marlinda Mike 07/17/2014, 11:23 PM

## 2014-07-17 NOTE — MAU Note (Signed)
Pt states that she has had some elevated blood pressures beginning this week with headache intermittently. Pt states that she was in the office on Monday and bloodwork was normal and 24 hour urine to be started tomorrow. Pt denies bleeding and leaking of fluid and states that baby is active.

## 2014-07-25 ENCOUNTER — Encounter (HOSPITAL_COMMUNITY): Payer: Self-pay

## 2014-07-25 ENCOUNTER — Ambulatory Visit (HOSPITAL_COMMUNITY)
Admission: RE | Admit: 2014-07-25 | Discharge: 2014-07-25 | Disposition: A | Discharge status: Home / Self Care | Payer: BLUE CROSS/BLUE SHIELD | Source: Ambulatory Visit | Attending: Obstetrics & Gynecology | Admitting: Obstetrics & Gynecology

## 2014-07-25 DIAGNOSIS — O99283 Endocrine, nutritional and metabolic diseases complicating pregnancy, third trimester: Secondary | ICD-10-CM | POA: Insufficient documentation

## 2014-07-25 DIAGNOSIS — R768 Other specified abnormal immunological findings in serum: Secondary | ICD-10-CM

## 2014-07-25 DIAGNOSIS — O99213 Obesity complicating pregnancy, third trimester: Secondary | ICD-10-CM | POA: Diagnosis not present

## 2014-07-25 DIAGNOSIS — O24419 Gestational diabetes mellitus in pregnancy, unspecified control: Secondary | ICD-10-CM | POA: Insufficient documentation

## 2014-07-25 DIAGNOSIS — Z3A34 34 weeks gestation of pregnancy: Secondary | ICD-10-CM | POA: Insufficient documentation

## 2014-07-25 DIAGNOSIS — O361930 Maternal care for other isoimmunization, third trimester, not applicable or unspecified: Secondary | ICD-10-CM | POA: Insufficient documentation

## 2014-07-25 DIAGNOSIS — E039 Hypothyroidism, unspecified: Secondary | ICD-10-CM | POA: Diagnosis not present

## 2014-07-25 LAB — OB RESULTS CONSOLE GBS: GBS: POSITIVE

## 2014-07-25 NOTE — ED Notes (Signed)
Pt reports leaking a small amt of clear fluid this am.  No leaking at this time.

## 2014-08-08 ENCOUNTER — Encounter (HOSPITAL_COMMUNITY): Payer: Self-pay

## 2014-08-08 ENCOUNTER — Telehealth (HOSPITAL_COMMUNITY): Payer: Self-pay | Admitting: *Deleted

## 2014-08-08 ENCOUNTER — Ambulatory Visit (HOSPITAL_COMMUNITY)
Admission: RE | Admit: 2014-08-08 | Discharge: 2014-08-08 | Disposition: A | Discharge status: Home / Self Care | Payer: BLUE CROSS/BLUE SHIELD | Source: Ambulatory Visit | Attending: Obstetrics | Admitting: Obstetrics

## 2014-08-08 DIAGNOSIS — Z3A36 36 weeks gestation of pregnancy: Secondary | ICD-10-CM | POA: Insufficient documentation

## 2014-08-08 DIAGNOSIS — O4103X0 Oligohydramnios, third trimester, not applicable or unspecified: Secondary | ICD-10-CM | POA: Diagnosis present

## 2014-08-08 DIAGNOSIS — O24419 Gestational diabetes mellitus in pregnancy, unspecified control: Secondary | ICD-10-CM | POA: Insufficient documentation

## 2014-08-08 DIAGNOSIS — O99283 Endocrine, nutritional and metabolic diseases complicating pregnancy, third trimester: Secondary | ICD-10-CM | POA: Diagnosis not present

## 2014-08-08 DIAGNOSIS — E039 Hypothyroidism, unspecified: Secondary | ICD-10-CM | POA: Insufficient documentation

## 2014-08-08 DIAGNOSIS — R768 Other specified abnormal immunological findings in serum: Secondary | ICD-10-CM

## 2014-08-08 NOTE — Telephone Encounter (Signed)
Preadmission screen  

## 2014-08-15 ENCOUNTER — Other Ambulatory Visit: Payer: Self-pay | Admitting: Obstetrics

## 2014-08-16 ENCOUNTER — Inpatient Hospital Stay (HOSPITAL_COMMUNITY)
Admission: RE | Admit: 2014-08-16 | Discharge: 2014-08-20 | DRG: 775 | Disposition: A | Discharge status: Home / Self Care | Payer: BLUE CROSS/BLUE SHIELD | Source: Ambulatory Visit | Attending: Obstetrics | Admitting: Obstetrics

## 2014-08-16 ENCOUNTER — Encounter (HOSPITAL_COMMUNITY): Payer: Self-pay

## 2014-08-16 VITALS — BP 124/76 | HR 82 | Temp 98.0°F | Resp 18 | Ht 67.0 in | Wt 302.0 lb

## 2014-08-16 DIAGNOSIS — O99214 Obesity complicating childbirth: Secondary | ICD-10-CM | POA: Diagnosis present

## 2014-08-16 DIAGNOSIS — O4103X0 Oligohydramnios, third trimester, not applicable or unspecified: Secondary | ICD-10-CM | POA: Diagnosis present

## 2014-08-16 DIAGNOSIS — O99284 Endocrine, nutritional and metabolic diseases complicating childbirth: Secondary | ICD-10-CM | POA: Diagnosis present

## 2014-08-16 DIAGNOSIS — O9989 Other specified diseases and conditions complicating pregnancy, childbirth and the puerperium: Secondary | ICD-10-CM | POA: Diagnosis present

## 2014-08-16 DIAGNOSIS — O9902 Anemia complicating childbirth: Secondary | ICD-10-CM | POA: Diagnosis present

## 2014-08-16 DIAGNOSIS — O361990 Maternal care for other isoimmunization, unspecified trimester, not applicable or unspecified: Secondary | ICD-10-CM | POA: Diagnosis present

## 2014-08-16 DIAGNOSIS — E039 Hypothyroidism, unspecified: Secondary | ICD-10-CM | POA: Diagnosis present

## 2014-08-16 DIAGNOSIS — Z87442 Personal history of urinary calculi: Secondary | ICD-10-CM

## 2014-08-16 DIAGNOSIS — E079 Disorder of thyroid, unspecified: Secondary | ICD-10-CM

## 2014-08-16 DIAGNOSIS — D649 Anemia, unspecified: Secondary | ICD-10-CM | POA: Diagnosis present

## 2014-08-16 DIAGNOSIS — Z6841 Body Mass Index (BMI) 40.0 and over, adult: Secondary | ICD-10-CM | POA: Diagnosis not present

## 2014-08-16 DIAGNOSIS — O99824 Streptococcus B carrier state complicating childbirth: Secondary | ICD-10-CM | POA: Diagnosis present

## 2014-08-16 DIAGNOSIS — Z822 Family history of deafness and hearing loss: Secondary | ICD-10-CM | POA: Diagnosis not present

## 2014-08-16 DIAGNOSIS — O9962 Diseases of the digestive system complicating childbirth: Secondary | ICD-10-CM | POA: Diagnosis present

## 2014-08-16 DIAGNOSIS — K219 Gastro-esophageal reflux disease without esophagitis: Secondary | ICD-10-CM | POA: Diagnosis present

## 2014-08-16 DIAGNOSIS — Z3A37 37 weeks gestation of pregnancy: Secondary | ICD-10-CM | POA: Diagnosis present

## 2014-08-16 DIAGNOSIS — R768 Other specified abnormal immunological findings in serum: Secondary | ICD-10-CM | POA: Diagnosis present

## 2014-08-16 DIAGNOSIS — O361930 Maternal care for other isoimmunization, third trimester, not applicable or unspecified: Secondary | ICD-10-CM | POA: Diagnosis present

## 2014-08-16 DIAGNOSIS — O24419 Gestational diabetes mellitus in pregnancy, unspecified control: Secondary | ICD-10-CM | POA: Diagnosis present

## 2014-08-16 DIAGNOSIS — O361190 Maternal care for Anti-A sensitization, unspecified trimester, not applicable or unspecified: Secondary | ICD-10-CM | POA: Diagnosis present

## 2014-08-16 DIAGNOSIS — O361130 Maternal care for Anti-A sensitization, third trimester, not applicable or unspecified: Secondary | ICD-10-CM

## 2014-08-16 DIAGNOSIS — Z3490 Encounter for supervision of normal pregnancy, unspecified, unspecified trimester: Secondary | ICD-10-CM

## 2014-08-16 LAB — CBC
HCT: 35.5 % — ABNORMAL LOW (ref 36.0–46.0)
Hemoglobin: 11.3 g/dL — ABNORMAL LOW (ref 12.0–15.0)
MCH: 25.9 pg — ABNORMAL LOW (ref 26.0–34.0)
MCHC: 31.8 g/dL (ref 30.0–36.0)
MCV: 81.4 fL (ref 78.0–100.0)
Platelets: 220 10*3/uL (ref 150–400)
RBC: 4.36 MIL/uL (ref 3.87–5.11)
RDW: 15.5 % (ref 11.5–15.5)
WBC: 8.3 10*3/uL (ref 4.0–10.5)

## 2014-08-16 MED ORDER — OXYCODONE-ACETAMINOPHEN 5-325 MG PO TABS: 2.0000 | ORAL_TABLET | ORAL | Status: DC | PRN

## 2014-08-16 MED ORDER — OXYTOCIN 40 UNITS IN LACTATED RINGERS INFUSION - SIMPLE MED
62.5000 mL/h | INTRAVENOUS | Status: DC
  Administered 2014-08-18: 62.5 mL/h via INTRAVENOUS

## 2014-08-16 MED ORDER — LACTATED RINGERS IV SOLN
INTRAVENOUS | Status: DC
  Administered 2014-08-16 – 2014-08-18 (×4): via INTRAVENOUS

## 2014-08-16 MED ORDER — ZOLPIDEM TARTRATE 5 MG PO TABS: 5.0000 mg | ORAL_TABLET | Freq: Every evening | ORAL | Status: DC | PRN

## 2014-08-16 MED ORDER — TERBUTALINE SULFATE 1 MG/ML IJ SOLN: 0.2500 mg | Freq: Once | INTRAMUSCULAR | Status: AC | PRN

## 2014-08-16 MED ORDER — LIDOCAINE HCL (PF) 1 % IJ SOLN
30.0000 mL | INTRAMUSCULAR | Status: DC | PRN
  Filled 2014-08-16: qty 30

## 2014-08-16 MED ORDER — INSULIN GLARGINE 100 UNIT/ML ~~LOC~~ SOLN
8.5000 [IU] | Freq: Every day | SUBCUTANEOUS | Status: AC
  Administered 2014-08-16: 9 [IU] via SUBCUTANEOUS
  Filled 2014-08-16: qty 0.09

## 2014-08-16 MED ORDER — ONDANSETRON HCL 4 MG/2ML IJ SOLN: 4.0000 mg | Freq: Four times a day (QID) | INTRAMUSCULAR | Status: DC | PRN

## 2014-08-16 MED ORDER — OXYTOCIN BOLUS FROM INFUSION: 500.0000 mL | INTRAVENOUS | Status: DC

## 2014-08-16 MED ORDER — BUTORPHANOL TARTRATE 1 MG/ML IJ SOLN
1.0000 mg | INTRAMUSCULAR | Status: DC | PRN
  Administered 2014-08-18 (×2): 1 mg via INTRAVENOUS
  Filled 2014-08-16 (×2): qty 1

## 2014-08-16 MED ORDER — PENICILLIN G POTASSIUM 5000000 UNITS IJ SOLR
2.5000 10*6.[IU] | INTRAVENOUS | Status: DC
  Administered 2014-08-17 – 2014-08-18 (×7): 2.5 10*6.[IU] via INTRAVENOUS
  Filled 2014-08-16 (×11): qty 2.5

## 2014-08-16 MED ORDER — CITRIC ACID-SODIUM CITRATE 334-500 MG/5ML PO SOLN: 30.0000 mL | ORAL | Status: DC | PRN

## 2014-08-16 MED ORDER — PENICILLIN G POTASSIUM 5000000 UNITS IJ SOLR
5.0000 10*6.[IU] | Freq: Once | INTRAVENOUS | Status: AC
  Administered 2014-08-17: 5 10*6.[IU] via INTRAVENOUS
  Filled 2014-08-16: qty 5

## 2014-08-16 MED ORDER — LACTATED RINGERS IV SOLN
500.0000 mL | INTRAVENOUS | Status: DC | PRN
  Administered 2014-08-18: 500 mL via INTRAVENOUS

## 2014-08-16 MED ORDER — MISOPROSTOL 25 MCG QUARTER TABLET
25.0000 ug | ORAL_TABLET | ORAL | Status: DC | PRN
  Administered 2014-08-16 – 2014-08-18 (×4): 25 ug via VAGINAL
  Filled 2014-08-16 (×3): qty 0.25
  Filled 2014-08-16: qty 1
  Filled 2014-08-16: qty 0.25

## 2014-08-16 MED ORDER — ACETAMINOPHEN 325 MG PO TABS: 650.0000 mg | ORAL_TABLET | ORAL | Status: DC | PRN

## 2014-08-16 MED ORDER — OXYCODONE-ACETAMINOPHEN 5-325 MG PO TABS: 1.0000 | ORAL_TABLET | ORAL | Status: DC | PRN

## 2014-08-16 NOTE — H&P (Signed)
Joan LamasSheena G Becker is a 29 y.o. G1P0000 at 7643w5d presenting for IOL. Pt notes no contractions. Good fetal movement, No vaginal bleeding, not leaking fluid. IOL for isoimmuization  [facility] 7 wks - dated by 5 and 7 wk u/s - h/o infertility, desired spontaneous conception -Antibody positive, anti-Duffy, husband tested positive for Duffy ab, amnio declined, high titer,followed by MFM for q 2wks doppers since 20 wks, no evidence of hydrops or elevated MCA dopplers. Recc delivery 37-38 wks - PCOS, metformin til 12 wks, passed 1 hr GTT, at 28 wks abnl 1hr then 3hr GTT, started on glyburide, BS high and transitioned to Lantus insulin. 2/ wk BPP since 32 wks. - 63# wt gain -hypothyroid, on levothyroxine, multiple dose adjustments in pregnancy - anemia, on iron - borderline oligohydramnios: 5-10 throughout 3rd trimeseter - BV x 2 in preg - renal stone   Prenatal Transfer Tool  Maternal Diabetes: Yes:  Diabetes Type:  Insulin/Medication controlled Genetic Screening: Normal Maternal Ultrasounds/Referrals: Normal Fetal Ultrasounds or other Referrals:  Referred to Materal Fetal Medicine  Maternal Substance Abuse:  No Significant Maternal Medications:  None Significant Maternal Lab Results: None     OB History    Gravida Para Term Preterm AB TAB SAB Ectopic Multiple Living   1 0 0 0 0 0 0 0 0 0      Past Medical History  Diagnosis Date  . Thyroid disease   . Hypothyroidism   . Obesity   . Kidney stones   . PCOS (polycystic ovarian syndrome)   . Abdominal pain in pregnancy, antepartum 06/06/2014  . Gestational diabetes    Past Surgical History  Procedure Laterality Date  . Tonsillectomy    . Adenoidectomy    . Wisdom tooth extraction    . Hysteroscopy w/d&c N/A 08/11/2012    Procedure: DILATATION AND CURETTAGE /HYSTEROSCOPY;  Surgeon: Adam PhenixJames G Arnold, MD;  Location: [facility];  Service: Gynecology;  Laterality: N/A;  . Myringotomy     Family History: family history  includes Hearing loss in her maternal aunt, maternal grandfather, maternal grandmother, maternal uncle, paternal aunt, paternal grandfather, paternal grandmother, and paternal uncle. Social History:  reports that she has never smoked. She has never used smokeless tobacco. She reports that she does not drink alcohol or use illicit drugs.  Review of Systems - Negative except discomfort of preg   Dilation: Closed Effacement (%): Thick Station: Ballotable Exam by:: Bertram MillardJ. Lopez, RN Blood pressure 114/55, pulse 87, temperature 98.1 F (36.7 C), temperature source Oral, resp. rate 22, height 5\' 7"  (1.702 m), weight 136.986 kg (302 lb), last menstrual period 11/13/2013.  Physical Exam: def to am  Filed Vitals:   08/16/14 1937 08/16/14 1947  BP: 114/55   Pulse: 87   Temp: 98.1 F (36.7 C)   TempSrc: Oral   Resp: 22   Height:  5\' 7"  (1.702 m)  Weight:  136.986 kg (302 lb)   CBC    Component Value Date/Time   WBC 8.3 08/16/2014 2125   RBC 4.36 08/16/2014 2125   HGB 11.3* 08/16/2014 2125   HCT 35.5* 08/16/2014 2125   PLT 220 08/16/2014 2125   MCV 81.4 08/16/2014 2125   MCH 25.9* 08/16/2014 2125   MCHC 31.8 08/16/2014 2125   RDW 15.5 08/16/2014 2125   LYMPHSABS 1.2 06/07/2014 1412   MONOABS 1.2* 06/07/2014 1412   EOSABS 0.1 06/07/2014 1412   BASOSABS 0.0 06/07/2014 1412      Prenatal labs: ABO, Rh: --/--/A POS (04/07  2125) Antibody: PENDING (04/07 2125) Rubella:  immnue RPR: Nonreactive (09/22 0000)  HBsAg: Negative (09/22 0000)  HIV: Non-reactive (09/22 0000)  GBS: Positive (03/16 0000)  1 hr Glucola abnl, pt w/ GDM  Genetic screening nl AFP Anatomy US nl   Assessment/Plan: 29 y.o. G1P0000 at [redacted]w[redacted]d IOL due to isoimmunization with Duffy and high titers, risk of fetal anemia - GDM, 1/2 dose normal Lantus tonight, diabetic diet, watch BS q 2 hrs while in active labor - IOL. cytotec now, move to pitocin tomorrow, pt aware increased risks c/s with IOL - GBS pos. Pcn when in  active labot - hypothyroid, cont levothyroxine   Chrles Selley A. 08/16/2014, 10:06 PM

## 2014-08-17 LAB — GLUCOSE, CAPILLARY
Glucose-Capillary: 93 mg/dL (ref 70–99)
Glucose-Capillary: 82 mg/dL (ref 70–99)
Glucose-Capillary: 93 mg/dL (ref 70–99)

## 2014-08-17 LAB — RPR: RPR: NONREACTIVE

## 2014-08-17 MED ORDER — OXYTOCIN 40 UNITS IN LACTATED RINGERS INFUSION - SIMPLE MED
1.0000 m[IU]/min | INTRAVENOUS | Status: DC
  Administered 2014-08-17: 2 m[IU]/min via INTRAVENOUS
  Filled 2014-08-17: qty 1000

## 2014-08-17 MED ORDER — INSULIN GLARGINE 100 UNIT/ML ~~LOC~~ SOLN
8.5000 [IU] | Freq: Every day | SUBCUTANEOUS | Status: AC
  Administered 2014-08-17: 9 [IU] via SUBCUTANEOUS
  Filled 2014-08-17: qty 0.09

## 2014-08-17 MED ORDER — TERBUTALINE SULFATE 1 MG/ML IJ SOLN: 0.2500 mg | Freq: Once | INTRAMUSCULAR | Status: AC | PRN

## 2014-08-17 NOTE — Progress Notes (Signed)
S: Doing well, no complaints, pain adequately controlled, notes back cramps, planning epidural  O: BP 141/96 mmHg  Pulse 74  Temp(Src) 98.3 F (36.8 C) (Oral)  Resp 20  Ht 5\' 7"  (1.702 m)  Wt 136.986 kg (302 lb)  BMI 47.29 kg/m2  LMP 11/13/2013  Filed Vitals:   08/17/14 1531 08/17/14 1602 08/17/14 1630 08/17/14 1700  BP: 132/73 78/47 142/84 141/96  Pulse: 74 69 62 74  Temp:  98.3 F (36.8 C)    TempSrc:  Oral    Resp: 20 20    Height:      Weight:          FHT:  FHR: 140s bpm, variability: moderate,  accelerations:  Present,  decelerations:  Absent UC:   regular, every 3 minutes SVE:   Dilation: 3 Effacement (%): 50 Station: -3 Exam by:: Dr Ernestina PennaFogleman Vtx very posterior  FSBS 82  A / P:  29 y.o.  Obstetric History   G1   P0   T0   P0   A0   TAB0   SAB0   E0   M0   L0    at 1348w6d IOL isoimmuization and GDM  Latent phase, slow progress  Fetal Wellbeing:  Category I Pain Control:  Labor support without medications  Anticipated MOD:  NSVD  Joan Becker A. 08/17/2014, 5:57 PM

## 2014-08-17 NOTE — Progress Notes (Signed)
S: Doing well, no complaints, pain well controlled though starting to have increased back pain. Likely planning epidural. No HA, no vision change, poor sleep o/n. Did not eat this am. Took  Half dose Lantus last night.   O: BP 143/82 mmHg  Pulse 80  Temp(Src) 98.4 F (36.9 C) (Oral)  Resp 20  Ht 5\' 7"  (1.702 m)  Wt 136.986 kg (302 lb)  BMI 47.29 kg/m2  LMP 11/13/2013   FHT:  FHR: 140s bpm, variability: moderate,  accelerations:  Present,  decelerations:  Absent UC:   regular, every 2-3 minutes, pitocin SVE:   Dilation: 2 Effacement (%): 20, 30 Station: -3 Exam by:: Dr Ernestina PennaFogleman   A / P:  29 y.o.  Obstetric History   G1   P0   T0   P0   A0   TAB0   SAB0   E0   M0   L0    at 5470w6d IOL due to Duffy Isoimmuization, GDM  BS check q 2 hrs when active labor - GBS pos, on PCN  Fetal Wellbeing:  Category I Pain Control:  Labor support without medications  Anticipated MOD:  NSVD  Kasra Melvin A. 08/17/2014, 3:07 PM

## 2014-08-18 ENCOUNTER — Encounter (HOSPITAL_COMMUNITY): Payer: Self-pay

## 2014-08-18 ENCOUNTER — Inpatient Hospital Stay (HOSPITAL_COMMUNITY): Payer: BLUE CROSS/BLUE SHIELD | Admitting: Anesthesiology

## 2014-08-18 LAB — GLUCOSE, CAPILLARY
Glucose-Capillary: 112 mg/dL — ABNORMAL HIGH (ref 70–99)
Glucose-Capillary: 77 mg/dL (ref 70–99)
Glucose-Capillary: 106 mg/dL — ABNORMAL HIGH (ref 70–99)

## 2014-08-18 MED ORDER — EPHEDRINE 5 MG/ML INJ
10.0000 mg | INTRAVENOUS | Status: DC | PRN
  Filled 2014-08-18: qty 2

## 2014-08-18 MED ORDER — FLEET ENEMA 7-19 GM/118ML RE ENEM: 1.0000 | ENEMA | Freq: Every day | RECTAL | Status: DC | PRN

## 2014-08-18 MED ORDER — IBUPROFEN 600 MG PO TABS
600.0000 mg | ORAL_TABLET | Freq: Four times a day (QID) | ORAL | Status: DC
  Administered 2014-08-18 – 2014-08-20 (×7): 600 mg via ORAL
  Filled 2014-08-18 (×8): qty 1

## 2014-08-18 MED ORDER — FENTANYL 2.5 MCG/ML BUPIVACAINE 1/10 % EPIDURAL INFUSION (WH - ANES)
14.0000 mL/h | INTRAMUSCULAR | Status: DC | PRN
  Administered 2014-08-18: 14 mL/h via EPIDURAL
  Filled 2014-08-18: qty 125

## 2014-08-18 MED ORDER — SENNOSIDES-DOCUSATE SODIUM 8.6-50 MG PO TABS
2.0000 | ORAL_TABLET | ORAL | Status: DC
  Administered 2014-08-19 – 2014-08-20 (×2): 2 via ORAL
  Filled 2014-08-18 (×2): qty 2

## 2014-08-18 MED ORDER — TERBUTALINE SULFATE 1 MG/ML IJ SOLN
0.2500 mg | Freq: Once | INTRAMUSCULAR | Status: DC | PRN
Start: 2014-08-18 — End: 2014-08-18
  Filled 2014-08-18: qty 1

## 2014-08-18 MED ORDER — OXYCODONE-ACETAMINOPHEN 5-325 MG PO TABS: 2.0000 | ORAL_TABLET | ORAL | Status: DC | PRN

## 2014-08-18 MED ORDER — BISACODYL 10 MG RE SUPP: 10.0000 mg | Freq: Every day | RECTAL | Status: DC | PRN

## 2014-08-18 MED ORDER — TETANUS-DIPHTH-ACELL PERTUSSIS 5-2.5-18.5 LF-MCG/0.5 IM SUSP: 0.5000 mL | Freq: Once | INTRAMUSCULAR | Status: DC

## 2014-08-18 MED ORDER — LEVOTHYROXINE SODIUM 75 MCG PO TABS: 75.0000 ug | ORAL_TABLET | Freq: Every day | ORAL | Status: DC

## 2014-08-18 MED ORDER — DIPHENHYDRAMINE HCL 50 MG/ML IJ SOLN: 12.5000 mg | INTRAMUSCULAR | Status: DC | PRN

## 2014-08-18 MED ORDER — DIBUCAINE 1 % RE OINT: 1.0000 "application " | TOPICAL_OINTMENT | RECTAL | Status: DC | PRN

## 2014-08-18 MED ORDER — PHENYLEPHRINE 40 MCG/ML (10ML) SYRINGE FOR IV PUSH (FOR BLOOD PRESSURE SUPPORT)
80.0000 ug | PREFILLED_SYRINGE | INTRAVENOUS | Status: DC | PRN
  Filled 2014-08-18: qty 20
  Filled 2014-08-18: qty 2

## 2014-08-18 MED ORDER — ACETAMINOPHEN 325 MG PO TABS: 650.0000 mg | ORAL_TABLET | ORAL | Status: DC | PRN

## 2014-08-18 MED ORDER — LEVOTHYROXINE SODIUM 75 MCG PO TABS
275.0000 ug | ORAL_TABLET | Freq: Every day | ORAL | Status: DC
  Administered 2014-08-18 – 2014-08-20 (×3): 275 ug via ORAL
  Filled 2014-08-18 (×4): qty 1

## 2014-08-18 MED ORDER — OXYCODONE-ACETAMINOPHEN 5-325 MG PO TABS: 1.0000 | ORAL_TABLET | ORAL | Status: DC | PRN

## 2014-08-18 MED ORDER — PRENATAL MULTIVITAMIN CH
1.0000 | ORAL_TABLET | Freq: Every day | ORAL | Status: DC
  Administered 2014-08-19 – 2014-08-20 (×2): 1 via ORAL
  Filled 2014-08-18 (×2): qty 1

## 2014-08-18 MED ORDER — PHENYLEPHRINE 40 MCG/ML (10ML) SYRINGE FOR IV PUSH (FOR BLOOD PRESSURE SUPPORT)
80.0000 ug | PREFILLED_SYRINGE | INTRAVENOUS | Status: DC | PRN
  Filled 2014-08-18: qty 2

## 2014-08-18 MED ORDER — ZOLPIDEM TARTRATE 5 MG PO TABS: 5.0000 mg | ORAL_TABLET | Freq: Every evening | ORAL | Status: DC | PRN

## 2014-08-18 MED ORDER — WITCH HAZEL-GLYCERIN EX PADS: 1.0000 "application " | MEDICATED_PAD | CUTANEOUS | Status: DC | PRN

## 2014-08-18 MED ORDER — LANOLIN HYDROUS EX OINT: TOPICAL_OINTMENT | CUTANEOUS | Status: DC | PRN

## 2014-08-18 MED ORDER — BENZOCAINE-MENTHOL 20-0.5 % EX AERO
1.0000 "application " | INHALATION_SPRAY | CUTANEOUS | Status: DC | PRN
  Administered 2014-08-18: 1 via TOPICAL
  Filled 2014-08-18: qty 56

## 2014-08-18 MED ORDER — LACTATED RINGERS IV SOLN
500.0000 mL | Freq: Once | INTRAVENOUS | Status: AC
  Administered 2014-08-18: 500 mL via INTRAVENOUS

## 2014-08-18 MED ORDER — ONDANSETRON HCL 4 MG PO TABS: 4.0000 mg | ORAL_TABLET | ORAL | Status: DC | PRN

## 2014-08-18 MED ORDER — LIDOCAINE HCL (PF) 1 % IJ SOLN
INTRAMUSCULAR | Status: DC | PRN
  Administered 2014-08-18 (×2): 4 mL

## 2014-08-18 MED ORDER — FENTANYL 2.5 MCG/ML BUPIVACAINE 1/10 % EPIDURAL INFUSION (WH - ANES)
INTRAMUSCULAR | Status: DC | PRN
  Administered 2014-08-18: 14 mL/h via EPIDURAL

## 2014-08-18 MED ORDER — SIMETHICONE 80 MG PO CHEW: 80.0000 mg | CHEWABLE_TABLET | ORAL | Status: DC | PRN

## 2014-08-18 MED ORDER — DIPHENHYDRAMINE HCL 25 MG PO CAPS: 25.0000 mg | ORAL_CAPSULE | Freq: Four times a day (QID) | ORAL | Status: DC | PRN

## 2014-08-18 MED ORDER — OXYTOCIN 40 UNITS IN LACTATED RINGERS INFUSION - SIMPLE MED
1.0000 m[IU]/min | INTRAVENOUS | Status: DC
  Administered 2014-08-18: 2 m[IU]/min via INTRAVENOUS

## 2014-08-18 MED ORDER — ONDANSETRON HCL 4 MG/2ML IJ SOLN: 4.0000 mg | INTRAMUSCULAR | Status: DC | PRN

## 2014-08-18 NOTE — Progress Notes (Signed)
Dr Ernestina Pennafogleman called and updated on fhe with vbls and now lates even with interventions such as iv fluids and position change. Now pitocin d/c'd. Notified of uc's not tracing well, esp with pt on side. OB has been reviewing fhr tracing remotely and also coming to see pt.

## 2014-08-18 NOTE — Anesthesia Preprocedure Evaluation (Addendum)
Anesthesia Evaluation  Patient identified by MRN, date of birth, ID band Patient awake    Reviewed: Allergy & Precautions, Patient's Chart, lab work & pertinent test results  Airway Mallampati: III  TM Distance: >3 FB Neck ROM: Full    Dental no notable dental hx. (+) Teeth Intact   Pulmonary neg pulmonary ROS,  breath sounds clear to auscultation  Pulmonary exam normal       Cardiovascular Rhythm:Regular Rate:Normal     Neuro/Psych negative neurological ROS  negative psych ROS   GI/Hepatic Neg liver ROS, GERD-  ,  Endo/Other  diabetes, Well Controlled, GestationalHypothyroidism Morbid obesity  Renal/GU Renal diseaseHx/o renal calculi  negative genitourinary   Musculoskeletal negative musculoskeletal ROS (+)   Abdominal (+) + obese,   Peds  Hematology  (+) anemia , Anti Duffy a antibody, and Anti C   Anesthesia Other Findings   Reproductive/Obstetrics (+) Pregnancy                            Anesthesia Physical Anesthesia Plan  ASA: III  Anesthesia Plan: Epidural   Post-op Pain Management:    Induction:   Airway Management Planned: Natural Airway  Additional Equipment:   Intra-op Plan:   Post-operative Plan:   Informed Consent: I have reviewed the patients History and Physical, chart, labs and discussed the procedure including the risks, benefits and alternatives for the proposed anesthesia with the patient or authorized representative who has indicated his/her understanding and acceptance.     Plan Discussed with: Anesthesiologist  Anesthesia Plan Comments:         Anesthesia Quick Evaluation

## 2014-08-18 NOTE — Anesthesia Procedure Notes (Signed)
Epidural Patient location during procedure: OB Start time: 08/18/2014 8:01 AM  Staffing Anesthesiologist: Mal AmabileFOSTER, Roquel Burgin Performed by: anesthesiologist   Preanesthetic Checklist Completed: patient identified, site marked, surgical consent, pre-op evaluation, timeout performed, IV checked, risks and benefits discussed and monitors and equipment checked  Epidural Patient position: sitting Prep: site prepped and draped and DuraPrep Patient monitoring: continuous pulse ox and blood pressure Approach: midline Location: L3-L4 Injection technique: LOR air  Needle:  Needle type: Tuohy  Needle gauge: 17 G Needle length: 9 cm and 9 Needle insertion depth: 7 cm Catheter type: closed end flexible Catheter size: 19 Gauge Catheter at skin depth: 12 cm Test dose: negative and Other  Assessment Events: blood not aspirated, injection not painful, no injection resistance, negative IV test and no paresthesia  Additional Notes Patient identified. Risks and benefits discussed including failed block, incomplete  Pain control, post dural puncture headache, nerve damage, paralysis, blood pressure Changes, nausea, vomiting, reactions to medications-both toxic and allergic and post Partum back pain. All questions were answered. Patient expressed understanding and wished to proceed. Sterile technique was used throughout procedure. Epidural site was Dressed with sterile barrier dressing. No paresthesias, signs of intravascular injection Or signs of intrathecal spread were encountered.  Patient was more comfortable after the epidural was dosed. Please see RN's note for documentation of vital signs and FHR which are stable.

## 2014-08-18 NOTE — Progress Notes (Signed)
S: Doing well, no complaints, pain well controlled with epidural just placed about 1 hr ago.   Stopped pitocin about 10p after minimal change all day, pt allowed light meal and shower, cytotec placed x 1 o/n and pictocin restarted 5 am. SROM 6 am.   O: BP 138/92 mmHg  Pulse 110  Temp(Src) 98.7 F (37.1 C) (Oral)  Resp 20  Ht 5\' 7"  (1.702 m)  Wt 136.986 kg (302 lb)  BMI 47.29 kg/m2  SpO2 100%  LMP 11/13/2013   FHT:  FHR: 130s bpm, variability: moderate,  accelerations:  Present,  decelerations:  Present variable and late decels after epidural, none currently UC:   Not tracing well- IUPC placed, pitocin off SVE:   Dilation: Lip/rim Effacement (%): 100 Station: 0 Exam by:: dr Ernestina Pennafogleman  FSBS 77  A / P:  29 y.o.  Obstetric History   G1   P0   T0   P0   A0   TAB0   SAB0   E0   M0   L0    at 344w0d IOL for isoimmuization- Duffy; also pt w/ GDM, on Lanuts  Good progress, recetn fetal decels likely due to quick change in fetal station/ cervical dilation Passive descent, may need to restart pitocin if contractions not continuing Stable BS GBS pos  Fetal Wellbeing:  Category II Pain Control:  Epidural  Anticipated MOD:  NSVD  Lowry Bala A. 08/18/2014, 9:19 AM

## 2014-08-19 ENCOUNTER — Encounter (HOSPITAL_COMMUNITY): Payer: Self-pay

## 2014-08-19 LAB — COMPREHENSIVE METABOLIC PANEL
ALBUMIN: 2.2 g/dL — AB (ref 3.5–5.2)
ALT: 9 U/L (ref 0–35)
ANION GAP: 4 — AB (ref 5–15)
AST: 13 U/L (ref 0–37)
Alkaline Phosphatase: 97 U/L (ref 39–117)
BILIRUBIN TOTAL: 0.3 mg/dL (ref 0.3–1.2)
BUN: 9 mg/dL (ref 6–23)
CO2: 23 mmol/L (ref 19–32)
Calcium: 8.4 mg/dL (ref 8.4–10.5)
Chloride: 110 mmol/L (ref 96–112)
Creatinine, Ser: 0.65 mg/dL (ref 0.50–1.10)
GFR calc Af Amer: >90 mL/min (ref >90)
GFR calc non Af Amer: >90 mL/min (ref >90)
Glucose, Bld: 85 mg/dL (ref 70–99)
Potassium: 4 mmol/L (ref 3.5–5.1)
Sodium: 137 mmol/L (ref 135–145)
TOTAL PROTEIN: 5.3 g/dL — AB (ref 6.0–8.3)

## 2014-08-19 LAB — CBC
HCT: 33.6 % — ABNORMAL LOW (ref 36.0–46.0)
HEMOGLOBIN: 10.7 g/dL — AB (ref 12.0–15.0)
MCH: 25.9 pg — ABNORMAL LOW (ref 26.0–34.0)
MCHC: 31.8 g/dL (ref 30.0–36.0)
MCV: 81.4 fL (ref 78.0–100.0)
Platelets: 180 10*3/uL (ref 150–400)
RBC: 4.13 MIL/uL (ref 3.87–5.11)
RDW: 15.2 % (ref 11.5–15.5)
WBC: 9.9 10*3/uL (ref 4.0–10.5)

## 2014-08-19 LAB — URIC ACID: Uric Acid, Serum: 3.5 mg/dL (ref 2.4–7.0)

## 2014-08-19 LAB — GLUCOSE, CAPILLARY: Glucose-Capillary: 76 mg/dL (ref 70–99)

## 2014-08-19 NOTE — Lactation Note (Signed)
This note was copied from the chart of Joan Becker. Lactation Consultation Note  Patient Name: Joan Merrilee JanskySheena Ramson ZOXWR'UToday's Date: 08/19/2014    [facility] evening RN, Jan who reports that mom has been able to latch baby tonight and is also using DEBP and expressing some ebm for cup feeding post-breastfeeding.   Maternal Data    Feeding    LATCH Score/Interventions                      Lactation Tools Discussed/Used     Consult Status    LC will follow tonight or tomorrow as needed  Lynda RainwaterBryant, Tajanae Guilbault Parmly 08/19/2014, 10:19 PM

## 2014-08-19 NOTE — Anesthesia Postprocedure Evaluation (Signed)
Anesthesia Post Note  Patient: Joan LamasSheena G Becker  Procedure(s) Performed: * No procedures listed *  Anesthesia type: Epidural  Patient location: Mother/Baby  Post pain: Pain level controlled  Post assessment: Post-op Vital signs reviewed  Last Vitals:  Filed Vitals:   08/19/14 0602  BP: 145/88  Pulse: 80  Temp: 36.7 C  Resp: 18    Post vital signs: Reviewed  Level of consciousness:alert  Complications: No apparent anesthesia complications

## 2014-08-19 NOTE — Progress Notes (Addendum)
Patient ID: Jaycelyn G Lesniak, female   DOB: [birth year], 29 y.o.   MRN:Dorice Lamas [id number] PPD # 1 SVD  S:  Reports feeling a little sore             Tolerating po/ No nausea or vomiting             Bleeding is light             Pain controlled with ibuprofen (OTC)             Up ad lib / ambulatory / voiding without difficulties    Newborn  Information for the patient's newborn:  Phoebe PerchRumley, Girl Tracye [[id number]][[id number]]  female  breast feeding   O:  A & O x 3, in no apparent distress              VS:  Filed Vitals:   08/18/14 1450 08/18/14 1600 08/18/14 2000 08/19/14 0602  BP: 122/68 126/81 115/62 145/88  Pulse: 76 81 91 80  Temp: 98.2 F (36.8 C) 98.1 F (36.7 C) 97.6 F (36.4 C) 98 F (36.7 C)  TempSrc: Oral Oral Oral Oral  Resp: 18 20 20 18   Height:      Weight:      SpO2:   98%     LABS:  Recent Labs  08/16/14 2125 08/19/14 0555  WBC 8.3 9.9  HGB 11.3* 10.7*  HCT 35.5* 33.6*  PLT 220 180    Blood type: A POS (04/07 2125)  Rubella: Immune (09/22 0000)   I&O: I/O last 3 completed shifts: In: -  Out: 1700 [Urine:1200; Blood:500]             Lungs: Clear and unlabored  Heart: regular rate and rhythm / no murmurs  Abdomen: soft, non-tender, non-distended              Fundus: firm, non-tender, U-1  Perineum: 2nd degree vaginal repair healing well, edematous mons pubis, no erythema, ecchymosis   Lochia: minimal  Extremities: no edema, no calf pain or tenderness, no Homans    A/P: PPD # 1  29 y.o., G1P1001   Principal Problem:    Postpartum care following vaginal delivery (4/9)  Active Problems:    Red cell alloimmunization, maternal, antepartum    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

## 2014-08-20 ENCOUNTER — Ambulatory Visit: Payer: Self-pay

## 2014-08-20 LAB — TYPE AND SCREEN
ABO/RH(D): A POS
Antibody Screen: POSITIVE
DAT, IgG: NEGATIVE
PT AG TYPE: NEGATIVE
Unit division: 0
Unit division: 0

## 2014-08-20 MED ORDER — IBUPROFEN 600 MG PO TABS: 600.0000 mg | ORAL_TABLET | Freq: Four times a day (QID) | ORAL | Status: DC

## 2014-08-20 NOTE — Discharge Summary (Signed)
Obstetric Discharge Summary  Reason for Admission: induction of labor - isoimmunization with (+) Duffy antibodies / Oligo / GDMa2 - Lantus / hypothyroidism / obesity  Prenatal Procedures: NST, ultrasound and MFM management Intrapartum Procedures: spontaneous vaginal delivery and GBS prophylaxis Postpartum Procedures: none Complications-Operative and Postpartum: 2nd degree perineal laceration HEMOGLOBIN  Date Value Ref Range Status  08/19/2014 10.7* 12.0 - 15.0 g/dL Final   HCT  Date Value Ref Range Status  08/19/2014 33.6* 36.0 - 46.0 % Final    Physical Exam:  General: alert, cooperative and no distress Uterine Fundus: firm DVT Evaluation: No evidence of DVT seen on physical exam.  Discharge Diagnoses: Term Pregnancy-delivered  Discharge Information: Date: 08/20/2014 Activity: pelvic rest Diet: routine Medications: PNV, Ibuprofen and Synthroid and Tylenol Condition: stable Instructions: refer to practice specific booklet Discharge to: home Follow-up Information    [facility]. Schedule an appointment as soon as possible for a visit in 6 weeks.   Specialty:  Obstetrics and Gynecology   Why:  thyroid labs in 2-6 weeks / sugar test (2hr GTT) in 6-12 weeks   Contact information:   [address] [place] [address] [phone]       Newborn Data: Live born female  Birth Weight: 7 lb 12.7 oz (3535 g) APGAR: 9, 9  Home with mother.  Marlinda MikeBAILEY, Marwa Fuhrman 08/20/2014, 12:57 PM

## 2014-08-20 NOTE — Lactation Note (Signed)
This note was copied from the chart of Joan Becker. Lactation Consultation Note Follow up visit at 58 hours of age.  Baby has had 5 breast feedings, 3 bottles and colostrum supplementation with syringe and bottle.  Baby has been fussy and not latching well.  Mom is discouraged with phototherapy lights and removed to work on breastfeeding.  Discussed with mom need for lights and only allowing a short time off.  Attempted latch after mom used hand pump to help evert nipple.  Baby sucks a few times and stops fussy.  Encouraged mom to continue to try nippleshield and FOB is able to apply appropriately.  Baby latched well with rhythmic sucking a few swallows heard.  Mom plans to continue to pump and supplement, but expressed wanting to have baby breastfeed.  Mom will need practice latching with nipple shield.  Mom has bottles and syringes for supplementation.  FOB at bedside supportive.  Observed 10 minute feeding, baby calm and content at the breast.    Patient Name: Joan Becker ZOXWR'UToday's Date: 08/20/2014 Reason for consult: Follow-up assessment;Difficult latch   Maternal Data Has patient been taught Hand Expression?: Yes  Feeding Feeding Type: Breast Fed Nipple Type: Slow - flow Length of feed: 10 min  LATCH Score/Interventions Latch: Grasps breast easily, tongue down, lips flanged, rhythmical sucking. Intervention(s): Skin to skin;Teach feeding cues;Waking techniques Intervention(s): Breast massage;Breast compression  Audible Swallowing: A few with stimulation Intervention(s): Skin to skin;Hand expression;Alternate breast massage  Type of Nipple: Flat Intervention(s): Hand pump  Comfort (Breast/Nipple): Soft / non-tender     Hold (Positioning): Assistance needed to correctly position infant at breast and maintain latch. Intervention(s): Breastfeeding basics reviewed;Support Pillows;Position options;Skin to skin  LATCH Score: 7  Lactation Tools Discussed/Used Tools:  Nipple Shields Nipple shield size: 24   Consult Status Consult Status: Follow-up Date: 08/21/14 Follow-up type: In-patient    Joan Becker 08/20/2014, 10:24 PM

## 2014-08-20 NOTE — Progress Notes (Signed)
PPD 2 SVD  S:  Reports feeling ok - frustrated with pumping and poor supply             Tolerating po/ No nausea or vomiting             Bleeding is light             Pain controlled with motrin             Up ad lib / ambulatory / voiding QS  Newborn breast feeding                      bili-light treatment with + antibody / isoimmunization with Duffy antibodies  O:               VS: BP 124/76 mmHg  Pulse 82  Temp(Src) 98 F (36.7 C) (Oral)  Resp 18  Ht 5\' 7"  (1.702 m)  Wt 136.986 kg (302 lb)  BMI 47.29 kg/m2  SpO2 98%  LMP 11/13/2013  Breastfeeding? Unknown   LABS:              Recent Labs  08/19/14 0555  WBC 9.9  HGB 10.7*  PLT 180               Blood type: --/--/A POS (04/07 2125)  Rubella: Immune (09/22 0000)                     Physical Exam:             Alert and oriented X3  Abdomen: soft, non-tender, non-distended              Fundus: firm, non-tender, U-1  Perineum: no edema  Lochia: light  Extremities: trace edema, no calf pain or tenderness    A: PPD # 2              GDMa2 - delivered              Isoimminization with Duffy antibodies  Doing well - stable status  P: Routine post partum orders  DC patient - will room-in with newborn until Peds discharges              GTT 6-12 weeks postpartum  Marlinda MikeBAILEY, Ermie Glendenning CNM, MSN, [facility] 08/20/2014, 12:50 PM

## 2014-08-21 ENCOUNTER — Ambulatory Visit: Payer: Self-pay

## 2014-08-21 NOTE — Lactation Note (Signed)
This note was copied from the chart of Joan Belicia Limehouse. Lactation Consultation Note  Follow up visit made prior to discharge.  Mom states feedings are going well now using a 24 mm nipple shield.  Mom has done some pumping with symphony pump and obtained 20 mls.  Discharge teaching done including engorgement treatment.  Lactation services and support information reviewed and encouraged.  Patient Name: Joan Merrilee JanskySheena Cashatt NFAOZ'HToday's Date: 08/21/2014     Maternal Data    Feeding Feeding Type: Breast Fed Length of feed: 20 min  LATCH Score/Interventions Latch: Grasps breast easily, tongue down, lips flanged, rhythmical sucking. Intervention(s): Skin to skin  Audible Swallowing: Spontaneous and intermittent Intervention(s): Skin to skin Intervention(s): Skin to skin  Type of Nipple: Everted at rest and after stimulation (with nipple shield)  Comfort (Breast/Nipple): Soft / non-tender     Hold (Positioning): Assistance needed to correctly position infant at breast and maintain latch.  LATCH Score: 9  Lactation Tools Discussed/Used Tools: Nipple Dorris CarnesShields   Consult Status      Huston FoleyMOULDEN, Truett Mcfarlan S 08/21/2014, 9:34 AM

## 2015-06-05 ENCOUNTER — Other Ambulatory Visit: Payer: Self-pay | Admitting: Family Medicine

## 2015-06-05 ENCOUNTER — Ambulatory Visit
Admission: RE | Admit: 2015-06-05 | Discharge: 2015-06-05 | Disposition: A | Discharge status: Home / Self Care | Payer: Managed Care, Other (non HMO) | Source: Ambulatory Visit | Attending: Family Medicine | Admitting: Family Medicine

## 2015-06-05 DIAGNOSIS — R1084 Generalized abdominal pain: Secondary | ICD-10-CM

## 2016-01-24 ENCOUNTER — Other Ambulatory Visit: Payer: Self-pay | Admitting: Obstetrics

## 2016-01-24 DIAGNOSIS — E0590 Thyrotoxicosis, unspecified without thyrotoxic crisis or storm: Secondary | ICD-10-CM

## 2016-01-29 ENCOUNTER — Ambulatory Visit
Admission: RE | Admit: 2016-01-29 | Discharge: 2016-01-29 | Disposition: A | Discharge status: Home / Self Care | Payer: Managed Care, Other (non HMO) | Source: Ambulatory Visit | Attending: Obstetrics | Admitting: Obstetrics

## 2016-01-29 DIAGNOSIS — E0590 Thyrotoxicosis, unspecified without thyrotoxic crisis or storm: Secondary | ICD-10-CM

## 2016-07-21 ENCOUNTER — Emergency Department (HOSPITAL_BASED_OUTPATIENT_CLINIC_OR_DEPARTMENT_OTHER): Payer: Managed Care, Other (non HMO)

## 2016-07-21 ENCOUNTER — Emergency Department (HOSPITAL_BASED_OUTPATIENT_CLINIC_OR_DEPARTMENT_OTHER)
Admission: EM | Admit: 2016-07-21 | Discharge: 2016-07-21 | Disposition: A | Discharge status: Home / Self Care | Payer: Managed Care, Other (non HMO) | Attending: Emergency Medicine | Admitting: Emergency Medicine

## 2016-07-21 ENCOUNTER — Encounter (HOSPITAL_BASED_OUTPATIENT_CLINIC_OR_DEPARTMENT_OTHER): Payer: Self-pay | Admitting: Emergency Medicine

## 2016-07-21 DIAGNOSIS — Y999 Unspecified external cause status: Secondary | ICD-10-CM | POA: Insufficient documentation

## 2016-07-21 DIAGNOSIS — Y939 Activity, unspecified: Secondary | ICD-10-CM | POA: Diagnosis not present

## 2016-07-21 DIAGNOSIS — M25531 Pain in right wrist: Secondary | ICD-10-CM | POA: Insufficient documentation

## 2016-07-21 DIAGNOSIS — W010XXA Fall on same level from slipping, tripping and stumbling without subsequent striking against object, initial encounter: Secondary | ICD-10-CM | POA: Insufficient documentation

## 2016-07-21 DIAGNOSIS — S6991XA Unspecified injury of right wrist, hand and finger(s), initial encounter: Secondary | ICD-10-CM | POA: Diagnosis present

## 2016-07-21 DIAGNOSIS — Y929 Unspecified place or not applicable: Secondary | ICD-10-CM | POA: Insufficient documentation

## 2016-07-21 DIAGNOSIS — Z79899 Other long term (current) drug therapy: Secondary | ICD-10-CM | POA: Diagnosis not present

## 2016-07-21 DIAGNOSIS — E039 Hypothyroidism, unspecified: Secondary | ICD-10-CM | POA: Insufficient documentation

## 2016-07-21 MED ORDER — IBUPROFEN 800 MG PO TABS: 800.0000 mg | ORAL_TABLET | Freq: Three times a day (TID) | ORAL | 0 refills | Status: DC

## 2016-07-21 MED FILL — IBUPROFEN 800 MG TABLET: 800 | 7 days supply | Qty: 21 | Fill #0

## 2016-07-21 NOTE — ED Triage Notes (Signed)
Pt slipped on ice and fell this morning. C/o R wrist pain, denies LOC or hitting head. Swelling noted.

## 2016-07-21 NOTE — ED Provider Notes (Signed)
[facility]   CSN: [id number] Arrival date & time: 07/21/16  [id number]     History   Chief Complaint Chief Complaint  Patient presents with  . Fall    HPI Joan Becker is a 31 y.o. female who presents with right wrist pain after fall on the ice this morning. Patient states she slipped and fell on outstretched hand. Patient now has right wrist pain. She denies any numbness or tingling. She has pain with wrist extension. She did not take any medications prior to arrival. Patient is right-handed. She denies any other injuries. She denies any head injury or losing consciousness.  HPI  Past Medical History:  Diagnosis Date  . Abdominal pain in pregnancy, antepartum 06/06/2014  . Gestational diabetes   . Hypothyroidism   . Kidney stones   . Obesity   . PCOS (polycystic ovarian syndrome)   . Postpartum care following vaginal delivery (4/9) 08/19/2014  . Thyroid disease     Patient Active Problem List   Diagnosis Date Noted  . Postpartum care following vaginal delivery (4/9) 08/19/2014  . Pregnancy 08/16/2014  . [redacted] weeks gestation of pregnancy   . [redacted] weeks gestation of pregnancy   . [redacted] weeks gestation of pregnancy   . GDM (gestational diabetes mellitus)   . Obesity affecting pregnancy in third trimester, antepartum   . [redacted] weeks gestation of pregnancy   . GDM, class A2   . Thyroid activity decreased   . Obesity complicating pregnancy in second trimester   . [redacted] weeks gestation of pregnancy   . Abdominal pain in pregnancy, antepartum 06/06/2014  . [redacted] weeks gestation of pregnancy   . Anti-Duffy antibodies present   . Antibody S titer positive in second trimester, antepartum   . [redacted] weeks gestation of pregnancy   . [redacted] weeks gestation of pregnancy   . 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 Evaluate anatomy not seen on prior sonogram   . Red cell alloimmunization, maternal, antepartum   . [redacted] weeks gestation of pregnancy   . Encounter for fetal anatomic  survey   . Menometrorrhagia 06/15/2012  . PCOS (polycystic ovarian syndrome) 05/06/2012  . Polycystic ovarian syndrome 05/06/2012  . Thyroid disease     Past Surgical History:  Procedure Laterality Date  . ADENOIDECTOMY    . HYSTEROSCOPY W/D&C N/A 08/11/2012   Procedure: DILATATION AND CURETTAGE /HYSTEROSCOPY;  Surgeon: Adam PhenixJames G Arnold, MD;  Location: [facility];  Service: Gynecology;  Laterality: N/A;  . MYRINGOTOMY    . TONSILLECTOMY    . WISDOM TOOTH EXTRACTION      OB History    Gravida Para Term Preterm AB Living   1 1 1  0 0 1   SAB TAB Ectopic Multiple Live Births   0 0 0 0 1       Home Medications    Prior to Admission medications   Medication Sig Start Date End Date Taking? Authorizing Provider  levothyroxine (SYNTHROID, LEVOTHROID) 200 MCG tablet Take 200 mcg by mouth daily before breakfast. Take with 50mcg for a total of 250mcg daily.   Yes Historical Provider, MD  levothyroxine (SYNTHROID, LEVOTHROID) 75 MCG tablet Take 75 mcg by mouth daily before breakfast. Patient takes with levothyroxine 200 mcg for a total dose of 275 mcg daily.   Yes Historical Provider, MD  calcium carbonate (TUMS - DOSED IN MG ELEMENTAL CALCIUM) 500 MG chewable tablet Chew 2 tablets by mouth as needed for indigestion or heartburn. Patient takes 5-6  times daily prn.    Historical Provider, MD  Cholecalciferol (VITAMIN D3) 1000 UNITS CAPS Take 1 capsule by mouth at bedtime.     Historical Provider, MD  ibuprofen (ADVIL,MOTRIN) 800 MG tablet Take 1 tablet (800 mg total) by mouth 3 (three) times daily. 07/21/16   Emi Holes, PA-C  Prenatal Vit-Fe Fumarate-FA (PRENATAL MULTIVITAMIN) TABS tablet Take 1 tablet by mouth at bedtime.     Historical Provider, MD    Family History Family History  Problem Relation Age of Onset  . Hearing loss Maternal Aunt   . Hearing loss Maternal Uncle   . Hearing loss Paternal Aunt   . Hearing loss Paternal Uncle   . Hearing loss Maternal Grandmother   . Hearing  loss Maternal Grandfather   . Hearing loss Paternal Grandmother   . Hearing loss Paternal Grandfather     Social History Social History  Substance Use Topics  . Smoking status: Never Smoker  . Smokeless tobacco: Never Used  . Alcohol use No     Allergies   Patient has no known allergies.   Review of Systems Review of Systems  Constitutional: Negative for fever.  Musculoskeletal: Positive for arthralgias. Negative for back pain and neck pain.  Skin: Negative for rash and wound.     Physical Exam Updated Vital Signs BP 114/74 (BP Location: Left Arm)   Pulse 83   Temp 98.4 F (36.9 C) (Oral)   Resp 16   Ht 5\' 7"  (1.702 m)   Wt 109.8 kg   LMP 06/17/2016 Comment: Patient states period is always late  SpO2 100%   BMI 37.90 kg/m   Physical Exam  Constitutional: She appears well-developed and well-nourished. No distress.  HENT:  Head: Normocephalic and atraumatic.  Mouth/Throat: Oropharynx is clear and moist. No oropharyngeal exudate.  Eyes: Conjunctivae are normal. Pupils are equal, round, and reactive to light. Right eye exhibits no discharge. Left eye exhibits no discharge. No scleral icterus.  Neck: Normal range of motion. Neck supple. No thyromegaly present.  Cardiovascular: Normal rate, regular rhythm, normal heart sounds and intact distal pulses.  Exam reveals no gallop and no friction rub.   No murmur heard. Pulmonary/Chest: Effort normal and breath sounds normal. No stridor. No respiratory distress. She has no wheezes. She has no rales.  Abdominal: Soft. Bowel sounds are normal. She exhibits no distension. There is no tenderness. There is no rebound and no guarding.  Musculoskeletal: She exhibits no edema.       Right wrist: She exhibits decreased range of motion, tenderness, bony tenderness and swelling. She exhibits no deformity and no laceration.  R wrist: Radial and ulnar tenderness about the wrist joint, anatomical snuffbox tenderness, pain with wrist  extension, no pain with flexion; abduction/adduction intact of all digits, flexion and extension intact. Normal sensation to all digits, cap refill <2secs; radial pulse intact  Lymphadenopathy:    She has no cervical adenopathy.  Neurological: She is alert. Coordination normal.  Skin: Skin is warm and dry. No rash noted. She is not diaphoretic. No pallor.  Psychiatric: She has a normal mood and affect.  Nursing note and vitals reviewed.    ED Treatments / Results  Labs (all labs ordered are listed, but only abnormal results are displayed) Labs Reviewed - No data to display  EKG  EKG Interpretation None       Radiology Dg Wrist Complete Right  Result Date: 07/21/2016 CLINICAL DATA:  Fall this morning.  Right wrist pain. EXAM: RIGHT  WRIST - COMPLETE 3+ VIEW COMPARISON:  11/12/2008 FINDINGS: There is no evidence of fracture or dislocation. There is no evidence of arthropathy or other focal bone abnormality. Soft tissues are unremarkable. IMPRESSION: Negative. Electronically Signed   By: Charlett Nose M.D.   On: 07/21/2016 10:13    Procedures Procedures (including critical care time)  Medications Ordered in ED Medications - No data to display   Initial Impression / Assessment and Plan / ED Course  I have reviewed the triage vital signs and the nursing notes.  Pertinent labs & imaging results that were available during my care of the patient were reviewed by me and considered in my medical decision making (see chart for details).     Right wrist x-ray negative, however considering anatomical snuffbox tenderness, will place patient in a Velcro thumb spica. She is advised to wear at all times. Follow-up to Dr. Amanda Pea with hand surgery for repeat x-ray in 1 week and further evaluation and treatment. Discharge home with ibuprofen. Supportive treatment discussed including ice and elevation. Return precautions discussed. Patient understands and agrees with plan. Patient vitals stable  throughout ED course and discharged in satisfactory condition.  Final Clinical Impressions(s) / ED Diagnoses   Final diagnoses:  Right wrist pain    New Prescriptions New Prescriptions   IBUPROFEN (ADVIL,MOTRIN) 800 MG TABLET    Take 1 tablet (800 mg total) by mouth 3 (three) times daily.     Emi Holes, PA-C 07/21/16 1038    Doug Sou, MD 07/21/16 1536

## 2016-07-21 NOTE — Discharge Instructions (Signed)
Medications: ibuprofen  Treatment: Wear wrist splint at all times until cleared by hand surgery. Use ice 3-4 times daily alternating 20 minutes on, 20 minutes off. Take ibuprofen every 8 hours as needed for your pain. You can alternate with Tylenol as prescribed over-the-counter.  Follow-up: Please follow-up with the hand doctor, Dr. Amanda PeaGramig, in one week for repeat x-ray and further evaluation and treatment of your wrist pain. Please return to emergency department if you develop any new or worsening symptoms.

## 2016-07-21 NOTE — ED Notes (Signed)
Patient transported to X-ray 

## 2017-06-08 ENCOUNTER — Encounter (HOSPITAL_COMMUNITY): Payer: Self-pay | Admitting: *Deleted

## 2017-06-08 ENCOUNTER — Inpatient Hospital Stay (HOSPITAL_COMMUNITY): Payer: Medicaid Other

## 2017-06-08 ENCOUNTER — Inpatient Hospital Stay (HOSPITAL_COMMUNITY)
Admission: AD | Admit: 2017-06-08 | Discharge: 2017-06-08 | Disposition: A | Discharge status: Home / Self Care | Payer: Medicaid Other | Source: Ambulatory Visit | Attending: Obstetrics and Gynecology | Admitting: Obstetrics and Gynecology

## 2017-06-08 DIAGNOSIS — Z3A01 Less than 8 weeks gestation of pregnancy: Secondary | ICD-10-CM | POA: Insufficient documentation

## 2017-06-08 DIAGNOSIS — O26899 Other specified pregnancy related conditions, unspecified trimester: Secondary | ICD-10-CM

## 2017-06-08 DIAGNOSIS — R300 Dysuria: Secondary | ICD-10-CM

## 2017-06-08 DIAGNOSIS — O26891 Other specified pregnancy related conditions, first trimester: Secondary | ICD-10-CM | POA: Diagnosis not present

## 2017-06-08 DIAGNOSIS — R102 Pelvic and perineal pain: Secondary | ICD-10-CM

## 2017-06-08 DIAGNOSIS — R109 Unspecified abdominal pain: Secondary | ICD-10-CM

## 2017-06-08 LAB — URINALYSIS, ROUTINE W REFLEX MICROSCOPIC
Bilirubin Urine: NEGATIVE
Glucose, UA: NEGATIVE mg/dL
HGB URINE DIPSTICK: NEGATIVE
Ketones, ur: NEGATIVE mg/dL
Nitrite: NEGATIVE
PROTEIN: NEGATIVE mg/dL
Specific Gravity, Urine: 1.023 (ref 1.005–1.030)
pH: 6 (ref 5.0–8.0)

## 2017-06-08 LAB — HCG, QUANTITATIVE, PREGNANCY: hCG, Beta Chain, Quant, S: 52938 m[IU]/mL — ABNORMAL HIGH (ref <5)

## 2017-06-08 NOTE — Discharge Instructions (Signed)
Dysuria Dysuria is pain or discomfort while urinating. The pain or discomfort may be felt in the tube that carries urine out of the bladder (urethra) or in the surrounding tissue of the genitals. The pain may also be felt in the groin area, lower abdomen, and lower back. You may have to urinate frequently or have the sudden feeling that you have to urinate (urgency). Dysuria can affect both men and women, but is more common in women. Dysuria can be caused by many different things, including:  Urinary tract infection in women.  Infection of the kidney or bladder.  Kidney stones or bladder stones.  Certain sexually transmitted infections (STIs), such as chlamydia.  Dehydration.  Inflammation of the vagina.  Use of certain medicines.  Use of certain soaps or scented products that cause irritation.  Follow these instructions at home: Watch your dysuria for any changes. The following actions may help to reduce any discomfort you are feeling:  Drink enough fluid to keep your urine clear or pale yellow.  Empty your bladder often. Avoid holding urine for long periods of time.  After a bowel movement or urination, women should cleanse from front to back, using each tissue only once.  Empty your bladder after sexual intercourse.  Take medicines only as directed by your health care provider.  If you were prescribed an antibiotic medicine, finish it all even if you start to feel better.  Avoid caffeine, tea, and alcohol. They can irritate the bladder and make dysuria worse. In men, alcohol may irritate the prostate.  Keep all follow-up visits as directed by your health care provider. This is important.  If you had any tests done to find the cause of dysuria, it is your responsibility to obtain your test results. Ask the lab or department performing the test when and how you will get your results. Talk with your health care provider if you have any questions about your results.  Contact a  health care provider if:  You develop pain in your back or sides.  You have a fever.  You have nausea or vomiting.  You have blood in your urine.  You are not urinating as often as you usually do. Get help right away if:  You pain is severe and not relieved with medicines.  You are unable to hold down any fluids.  You or someone else notices a change in your mental function.  You have a rapid heartbeat at rest.  You have shaking or chills.  You feel extremely weak. This information is not intended to replace advice given to you by your health care provider. Make sure you discuss any questions you have with your health care provider. Document Released: 01/24/2004 Document Revised: 10/03/2015 Document Reviewed: 12/21/2013 Elsevier Interactive Patient Education  2018 [employer]. First Trimester of Pregnancy The first trimester of pregnancy is from week 1 until the end of week 13 (months 1 through 3). During this time, your baby will begin to develop inside you. At 6-8 weeks, the eyes and face are formed, and the heartbeat can be seen on ultrasound. At the end of 12 weeks, all the baby's organs are formed. Prenatal care is all the medical care you receive before the birth of your baby. Make sure you get good prenatal care and follow all of your doctor's instructions. Follow these instructions at home: Medicines  Take over-the-counter and prescription medicines only as told by your doctor. Some medicines are safe and some medicines are not safe during  pregnancy.  Take a prenatal vitamin that contains at least 600 micrograms (mcg) of folic acid.  If you have trouble pooping (constipation), take medicine that will make your stool soft (stool softener) if your doctor approves. Eating and drinking  Eat regular, healthy meals.  Your doctor will tell you the amount of weight gain that is right for you.  Avoid raw meat and uncooked cheese.  If you feel sick to your stomach  (nauseous) or throw up (vomit): ? Eat 4 or 5 small meals a day instead of 3 large meals. ? Try eating a few soda crackers. ? Drink liquids between meals instead of during meals.  To prevent constipation: ? Eat foods that are high in fiber, like fresh fruits and vegetables, whole grains, and beans. ? Drink enough fluids to keep your pee (urine) clear or pale yellow. Activity  Exercise only as told by your doctor. Stop exercising if you have cramps or pain in your lower belly (abdomen) or low back.  Do not exercise if it is too hot, too humid, or if you are in a place of great height (high altitude).  Try to avoid standing for long periods of time. Move your legs often if you must stand in one place for a long time.  Avoid heavy lifting.  Wear low-heeled shoes. Sit and stand up straight.  You can have sex unless your doctor tells you not to. Relieving pain and discomfort  Wear a good support bra if your breasts are sore.  Take warm water baths (sitz baths) to soothe pain or discomfort caused by hemorrhoids. Use hemorrhoid cream if your doctor says it is okay.  Rest with your legs raised if you have leg cramps or low back pain.  If you have puffy, bulging veins (varicose veins) in your legs: ? Wear support hose or compression stockings as told by your doctor. ? Raise (elevate) your feet for 15 minutes, 3-4 times a day. ? Limit salt in your food. Prenatal care  Schedule your prenatal visits by the twelfth week of pregnancy.  Write down your questions. Take them to your prenatal visits.  Keep all your prenatal visits as told by your doctor. This is important. Safety  Wear your seat belt at all times when driving.  Make a list of emergency phone numbers. The list should include numbers for family, friends, the hospital, and police and fire departments. General instructions  Ask your doctor for a referral to a local prenatal class. Begin classes no later than at the start of  month 6 of your pregnancy.  Ask for help if you need counseling or if you need help with nutrition. Your doctor can give you advice or tell you where to go for help.  Do not use hot tubs, steam rooms, or saunas.  Do not douche or use tampons or scented sanitary pads.  Do not cross your legs for long periods of time.  Avoid all herbs and alcohol. Avoid drugs that are not approved by your doctor.  Do not use any tobacco products, including cigarettes, chewing tobacco, and electronic cigarettes. If you need help quitting, ask your doctor. You may get counseling or other support to help you quit.  Avoid cat litter boxes and soil used by cats. These carry germs that can cause birth defects in the baby and can cause a loss of your baby (miscarriage) or stillbirth.  Visit your dentist. At home, brush your teeth with a soft toothbrush. Be gentle when you  floss. Contact a doctor if:  You are dizzy.  You have mild cramps or pressure in your lower belly.  You have a nagging pain in your belly area.  You continue to feel sick to your stomach, you throw up, or you have watery poop (diarrhea).  You have a bad smelling fluid coming from your vagina.  You have pain when you pee (urinate).  You have increased puffiness (swelling) in your face, hands, legs, or ankles. Get help right away if:  You have a fever.  You are leaking fluid from your vagina.  You have spotting or bleeding from your vagina.  You have very bad belly cramping or pain.  You gain or lose weight rapidly.  You throw up blood. It may look like coffee grounds.  You are around people who have [place] measles, fifth disease, or chickenpox.  You have a very bad headache.  You have shortness of breath.  You have any kind of trauma, such as from a fall or a car accident. Summary  The first trimester of pregnancy is from week 1 until the end of week 13 (months 1 through 3).  To take care of yourself and your unborn  baby, you will need to eat healthy meals, take medicines only if your doctor tells you to do so, and do activities that are safe for you and your baby.  Keep all follow-up visits as told by your doctor. This is important as your doctor will have to ensure that your baby is healthy and growing well. This information is not intended to replace advice given to you by your health care provider. Make sure you discuss any questions you have with your health care provider. Document Released: 10/14/2007 Document Revised: 05/05/2016 Document Reviewed: 05/05/2016 Elsevier Interactive Patient Education  2017 [employer].

## 2017-06-08 NOTE — MAU Note (Addendum)
PT SAYS LMP WAS 04-02-2017 BUT  HAD NUVARING   [facility]     DID HPT - ON 05-22-2017 POSITIVE.   HAD BLOOD TEST ON 05-25-2017 -   988           THEN ON 05-27-2017 BLOOD TEST WAS 2124.       PT PAID WITH CASH- NO INS.    APPLIED FOR MEDICAID - SHE CALLED CLINIC TODAY - CAN'T  GET AN APPOINTMENT UNTIL CONFIRMED  10 WEEKS .Marland Kitchen.     TONIGHT FEELS CRAMPS - NO BLEEDING. LAST SEX-  2 WEEKS .   PT ALSO SAYS SHE HAS  DUFFY

## 2017-06-08 NOTE — MAU Provider Note (Signed)
Chief Complaint: No chief complaint on file.   First Provider Initiated Contact with Patient 06/08/17 2149        SUBJECTIVE HPI: Joan Becker is a 32 y.o. G1P1001 at Unknown by LMP who presents to maternity admissions reporting lower pelvic cramping   Some pain in left flank.  She feels like this is a UTI, similar to what she had before.  Had a GYN physical in November with cultures.  Was seen once by Dr Ernestina PennaFogleman but does not have insurance for 60 mor days, so was told they cannot see her.  Had two HCG levels, which did nearly double.  Tried calling clinic but states cannot get in downstairs. She denies vaginal bleeding, vaginal itching/burning, urinary symptoms, h/a, dizziness, n/v, or fever/chills.     Abdominal Pain  This is a new problem. The current episode started today. The onset quality is gradual. The problem occurs intermittently. The problem has been unchanged. The pain is located in the left flank and suprapubic region. The pain is mild. The quality of the pain is cramping and aching. The abdominal pain does not radiate. Pertinent negatives include no constipation, diarrhea, dysuria, fever, headaches, nausea or vomiting. Nothing aggravates the pain. The pain is relieved by nothing. She has tried nothing for the symptoms.    RN Note: PT SAYS LMP WAS 04-02-2017 BUT  HAD NUVARING   [facility] 04-08-2017[facility] .     DID HPT - ON 05-22-2017 POSITIVE.   HAD BLOOD TEST ON 05-25-2017 -   988           THEN ON 05-27-2017 BLOOD TEST WAS 2124.       PT PAID WITH CASH- NO INS.    APPLIED FOR MEDICAID - SHE CALLED CLINIC TODAY - CAN'T  GET AN APPOINTMENT UNTIL CONFIRMED  10 WEEKS .Marland Kitchen.     TONIGHT FEELS CRAMPS - NO BLEEDING. LAST SEX-  2 WEEKS .   PT ALSO SAYS SHE HAS  DUFFY    Past Medical History:  Diagnosis Date  . Abdominal pain in pregnancy, antepartum 06/06/2014  . Gestational diabetes   . Hypothyroidism   . Kidney stones   . Obesity   . PCOS (polycystic ovarian syndrome)   . Postpartum  care following vaginal delivery (4/9) 08/19/2014  . Thyroid disease    Past Surgical History:  Procedure Laterality Date  . ADENOIDECTOMY    . HYSTEROSCOPY W/D&C N/A 08/11/2012   Procedure: DILATATION AND CURETTAGE /HYSTEROSCOPY;  Surgeon: Adam PhenixJames G Arnold, MD;  Location: [facility];  Service: Gynecology;  Laterality: N/A;  . MYRINGOTOMY    . TONSILLECTOMY    . WISDOM TOOTH EXTRACTION     Social History   Socioeconomic History  . Marital status: Married    Spouse name: Not on file  . Number of children: Not on file  . Years of education: Not on file  . Highest education level: Not on file  Social Needs  . Financial resource strain: Not on file  . Food insecurity - worry: Not on file  . Food insecurity - inability: Not on file  . Transportation needs - medical: Not on file  . Transportation needs - non-medical: Not on file  Occupational History  . Not on file  Tobacco Use  . Smoking status: Never Smoker  . Smokeless tobacco: Never Used  Substance and Sexual Activity  . Alcohol use: No  . Drug use: No  . Sexual activity: Yes  Other Topics Concern  . Not  on file  Social History Narrative  . Not on file   No current facility-administered medications on file prior to encounter.    Current Outpatient Medications on File Prior to Encounter  Medication Sig Dispense Refill  . calcium carbonate (TUMS - DOSED IN MG ELEMENTAL CALCIUM) 500 MG chewable tablet Chew 2 tablets by mouth as needed for indigestion or heartburn. Patient takes 5-6 times daily prn.    . Cholecalciferol (VITAMIN D3) 1000 UNITS CAPS Take 1 capsule by mouth at bedtime.     Marland Kitchen ibuprofen (ADVIL,MOTRIN) 800 MG tablet Take 1 tablet (800 mg total) by mouth 3 (three) times daily. 21 tablet 0  . levothyroxine (SYNTHROID, LEVOTHROID) 200 MCG tablet Take 200 mcg by mouth daily before breakfast. Take with for a total of daily.    Marland Kitchen levothyroxine (SYNTHROID, LEVOTHROID) 75 MCG tablet Take 75 mcg by mouth daily before  breakfast. Patient takes with levothyroxine 200 mcg for a total dose of 275 mcg daily.    . Prenatal Vit-Fe Fumarate-FA (PRENATAL MULTIVITAMIN) TABS tablet Take 1 tablet by mouth at bedtime.      No Known Allergies  I have reviewed patient's Past Medical Hx, Surgical Hx, Family Hx, Social Hx, medications and allergies.   ROS:  Review of Systems  Constitutional: Negative for fever.  Gastrointestinal: Positive for abdominal pain. Negative for constipation, diarrhea, nausea and vomiting.  Genitourinary: Negative for dysuria.  Neurological: Negative for headaches.   Review of Systems  Other systems negative   Physical Exam  Physical Exam Patient Vitals for the past 24 hrs:  BP Temp Temp src Pulse Resp Height Weight  06/08/17 2139 132/78 98.6 F (37 C) Oral 74 20 5' 7.5" (1.715 m) 293 lb 4 oz (133 kg)   Constitutional: Well-developed, well-nourished female in no acute distress.  Cardiovascular: normal rate Respiratory: normal effort GI: Abd soft, non-tender. Pos BS x 4 MS: Extremities nontender, no edema, normal ROM Neurologic: Alert and oriented x 4.  GU: Neg CVAT.  PELVIC EXAM: Cervix pink, visually closed, without lesion, scant white creamy discharge, vaginal walls and external genitalia normal Bimanual exam: Cervix 0/long/high, firm, anterior, neg CMT, uterus nontender, nonenlarged, adnexa with mild tenderness, but no enlargement or mass    LAB RESULTS Results for orders placed or performed during the hospital encounter of 06/08/17 (from the past 24 hour(s))  Urinalysis, Routine w reflex microscopic     Status: Abnormal   Collection Time: 06/08/17  9:41 PM  Result Value Ref Range   Color, Urine YELLOW YELLOW   APPearance CLOUDY (A) CLEAR   Specific Gravity, Urine 1.023 1.005 - 1.030   pH 6.0 5.0 - 8.0   Glucose, UA NEGATIVE NEGATIVE mg/dL   Hgb urine dipstick NEGATIVE NEGATIVE   Bilirubin Urine NEGATIVE NEGATIVE   Ketones, ur NEGATIVE NEGATIVE mg/dL   Protein, ur  NEGATIVE NEGATIVE mg/dL   Nitrite NEGATIVE NEGATIVE   Leukocytes, UA SMALL (A) NEGATIVE   RBC / HPF 0-5 0 - 5 RBC/hpf   WBC, UA 0-5 0 - 5 WBC/hpf   Bacteria, UA RARE (A) NONE SEEN   Squamous Epithelial / LPF 0-5 (A) NONE SEEN   Mucus PRESENT   hCG, quantitative, pregnancy     Status: Abnormal   Collection Time: 06/08/17  9:49 PM  Result Value Ref Range   hCG, Beta Chain, Quant, S 52,938 (H) <5 mIU/mL   Urine sent to culture    IMAGING US Ob Comp Less 14 Wks  Result Date: 06/08/2017  CLINICAL DATA:  Pelvic pain EXAM: OBSTETRIC <14 WK [place] AND TRANSVAGINAL OB US TECHNIQUE: Both transabdominal and transvaginal ultrasound examinations were performed for complete evaluation of the gestation as well as the maternal uterus, adnexal regions, and pelvic cul-de-sac. Transvaginal technique was performed to assess early pregnancy. COMPARISON:  None. FINDINGS: Intrauterine gestational sac: Single Yolk sac:  Visualized Embryo:  Visualized Cardiac Activity: Visualized Heart Rate: 126 bpm MSD:   mm    w     d CRL:  7.8 mm   6 w   4 d                  [place] EDC: 01/28/2018 Subchorionic hemorrhage:  None visualized. Maternal uterus/adnexae: No adnexal mass. Trace free fluid in the pelvis. IMPRESSION: Seven week 4 day intrauterine pregnancy. Fetal heart rate 126 beats per minute. No acute maternal findings. Electronically Signed   By: Charlett Nose M.D.   On: 06/08/2017 22:42   US Ob Transvaginal  Result Date: 06/08/2017 CLINICAL DATA:  Pelvic pain EXAM: OBSTETRIC <14 WK [place] AND TRANSVAGINAL OB US TECHNIQUE: Both transabdominal and transvaginal ultrasound examinations were performed for complete evaluation of the gestation as well as the maternal uterus, adnexal regions, and pelvic cul-de-sac. Transvaginal technique was performed to assess early pregnancy. COMPARISON:  None. FINDINGS: Intrauterine gestational sac: Single Yolk sac:  Visualized Embryo:  Visualized Cardiac Activity: Visualized Heart Rate: 126 bpm MSD:   mm     w     d CRL:  7.8 mm   6 w   4 d                  [place] EDC: 01/28/2018 Subchorionic hemorrhage:  None visualized. Maternal uterus/adnexae: No adnexal mass. Trace free fluid in the pelvis. IMPRESSION: Seven week 4 day intrauterine pregnancy. Fetal heart rate 126 beats per minute. No acute maternal findings. Electronically Signed   By: Charlett Nose M.D.   On: 06/08/2017 22:42     MAU Management/MDM: Ordered usual first trimester r/o ectopic labs.   Pelvic exam and cultures done Will check baseline Ultrasound to rule out ectopic.  This bleeding/pain can represent a normal pregnancy with bleeding, spontaneous abortion or even an ectopic which can be life-threatening.  The process as listed above helps to determine which of these is present.  US showed single IUP with heartbeat at 6wks 4days Since urine has not blood, doubt stone.  Only leuk positive, so will not treat presumptively for UTI just now.  Will send urine for culture and treat if positive Has MyChart and instructed to follow results.  If positive, we will call  ASSESSMENT 1. Pelvic pain affecting pregnancy   2. Pelvic pain affecting pregnancy   3.  Confirmed single IUP at [redacted]w[redacted]d   PLAN Discharge home Urine to culture Proof of pregnancy letter Message sent to [facility] office for new OB appt  Pt stable at time of discharge. Encouraged to return here or to other Urgent Care/ED if she develops worsening of symptoms, increase in pain, fever, or other concerning symptoms.    Wynelle Bourgeois CNM, MSN Certified Nurse-Midwife 06/08/2017  9:49 PM

## 2017-06-10 LAB — CULTURE, OB URINE: Culture: <10000 — AB

## 2017-06-11 ENCOUNTER — Telehealth: Payer: Self-pay | Admitting: General Practice

## 2017-06-11 NOTE — Telephone Encounter (Signed)
Called patient to schedule New OB per In-basket message.  Patient stated that she will be going to another office "[id number]".

## 2017-06-25 ENCOUNTER — Encounter: Payer: Self-pay | Admitting: Family Medicine

## 2017-06-25 ENCOUNTER — Other Ambulatory Visit (HOSPITAL_COMMUNITY)
Admission: RE | Admit: 2017-06-25 | Discharge: 2017-06-25 | Disposition: A | Discharge status: Home / Self Care | Payer: Medicaid Other | Source: Ambulatory Visit | Attending: Family Medicine | Admitting: Family Medicine

## 2017-06-25 ENCOUNTER — Ambulatory Visit: Payer: Medicaid Other | Admitting: Family Medicine

## 2017-06-25 VITALS — BP 125/69 | HR 75 | Wt 288.0 lb

## 2017-06-25 DIAGNOSIS — O99281 Endocrine, nutritional and metabolic diseases complicating pregnancy, first trimester: Secondary | ICD-10-CM | POA: Insufficient documentation

## 2017-06-25 DIAGNOSIS — Z8632 Personal history of gestational diabetes: Secondary | ICD-10-CM | POA: Diagnosis not present

## 2017-06-25 DIAGNOSIS — E669 Obesity, unspecified: Secondary | ICD-10-CM

## 2017-06-25 DIAGNOSIS — E039 Hypothyroidism, unspecified: Secondary | ICD-10-CM | POA: Diagnosis not present

## 2017-06-25 DIAGNOSIS — O0990 Supervision of high risk pregnancy, unspecified, unspecified trimester: Secondary | ICD-10-CM | POA: Insufficient documentation

## 2017-06-25 DIAGNOSIS — O99210 Obesity complicating pregnancy, unspecified trimester: Secondary | ICD-10-CM | POA: Insufficient documentation

## 2017-06-25 DIAGNOSIS — J0100 Acute maxillary sinusitis, unspecified: Secondary | ICD-10-CM

## 2017-06-25 DIAGNOSIS — O361910 Maternal care for other isoimmunization, first trimester, not applicable or unspecified: Secondary | ICD-10-CM

## 2017-06-25 DIAGNOSIS — O99211 Obesity complicating pregnancy, first trimester: Secondary | ICD-10-CM

## 2017-06-25 DIAGNOSIS — O361911 Maternal care for other isoimmunization, first trimester, fetus 1: Secondary | ICD-10-CM

## 2017-06-25 DIAGNOSIS — O0991 Supervision of high risk pregnancy, unspecified, first trimester: Secondary | ICD-10-CM

## 2017-06-25 DIAGNOSIS — R768 Other specified abnormal immunological findings in serum: Secondary | ICD-10-CM

## 2017-06-25 MED ORDER — AMOXICILLIN-POT CLAVULANATE 875-125 MG PO TABS: 1.0000 | ORAL_TABLET | Freq: Two times a day (BID) | ORAL | 1 refills | Status: AC

## 2017-06-25 NOTE — Progress Notes (Signed)
Subjective:  Joan Becker is a G2P1001 [redacted]w[redacted]d being seen today for her first obstetrical visit.  Her obstetrical history is significant for obesity, iosimmunization with anti-Duffy antibody, h/o GDM, hypothyroidism. This was an unintended pregnancy. Patient is married to FOB. Patient does intend to breast feed. Pregnancy history fully reviewed.  Patient reports left ear pain, nasal congestion, left maxillary sinus pain x2 weeks. Worsening..  BP 125/69   Pulse 75   Wt 288 lb (130.6 kg)   LMP 04/02/2017   BMI 44.44 kg/m   HISTORY: OB History  Gravida Para Term Preterm AB Living  2 1 1  0 0 1  SAB TAB Ectopic Multiple Live Births  0 0 0 0 1    # Outcome Date GA Lbr Len/2nd Weight Sex Delivery Anes PTL Lv  2 Current           1 Term 08/18/14 [redacted]w[redacted]d 04:01 / 01:39 7 lb 12.7 oz (3.535 kg) F Vag-Spont EPI  LIV      Past Medical History:  Diagnosis Date  . Abdominal pain in pregnancy, antepartum 06/06/2014  . Gestational diabetes   . Hypothyroidism   . Kidney stones   . Obesity   . PCOS (polycystic ovarian syndrome)   . Postpartum care following vaginal delivery (4/9) 08/19/2014  . Thyroid disease     Past Surgical History:  Procedure Laterality Date  . ADENOIDECTOMY    . HYSTEROSCOPY W/D&C N/A 08/11/2012   Procedure: DILATATION AND CURETTAGE /HYSTEROSCOPY;  Surgeon: Adam Phenix, MD;  Location: [facility];  Service: Gynecology;  Laterality: N/A;  . MYRINGOTOMY    . TONSILLECTOMY    . WISDOM TOOTH EXTRACTION      Family History  Problem Relation Age of Onset  . Hearing loss Maternal Aunt   . Hearing loss Maternal Uncle   . Hearing loss Paternal Aunt   . Hearing loss Paternal Uncle   . Hearing loss Maternal Grandmother   . Hearing loss Maternal Grandfather   . Hearing loss Paternal Grandmother   . Hearing loss Paternal Grandfather   . Diabetes Father   . Hypertension Father   . Cancer Neg Hx      Exam    Uterus:     Pelvic Exam:    Perineum: No Hemorrhoids, Normal  Perineum   Vulva: normal, Bartholin's, Urethra, Skene's normal   Vagina:  normal mucosa   Cervix: multiparous appearance, no cervical motion tenderness and nulliparous appearance   Adnexa: normal adnexa and no mass, fullness, tenderness   Bony Pelvis: gynecoid  System: Breast:  Declined - recently had breast exam at [facility]   Skin: normal coloration and turgor, no rashes    Neurologic: gait normal; reflexes normal and symmetric   Extremities: normal strength, tone, and muscle mass   HEENT PERRLA, extra ocular movement intact, sclera clear, anicteric and maxillary sinus tenderness, TMs retracted   Mouth/Teeth mucous membranes moist, pharynx normal without lesions   Neck supple and no masses   Cardiovascular: regular rate and rhythm, no murmurs or gallops   Respiratory:  appears well, vitals normal, no respiratory distress, acyanotic, normal RR, ear and throat exam is normal, neck free of mass or lymphadenopathy, chest clear, no wheezing, crepitations, rhonchi, normal symmetric air entry   Abdomen: soft, non-tender; bowel sounds normal; no masses,  no organomegaly   Urinary: urethral meatus normal      Assessment:    Pregnancy: G2P1001 Patient Active Problem List   Diagnosis Date Noted  . Supervision of  high risk pregnancy, antepartum 06/25/2017  . History of gestational diabetes mellitus (GDM) 06/25/2017  . Obesity in pregnancy, antepartum   . Abdominal pain in pregnancy, antepartum 06/06/2014  . Anti-Duffy antibodies present   . Antibody S titer positive in second trimester, antepartum   . 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 Menometrorrhagia 06/15/2012  . PCOS (polycystic ovarian syndrome) 05/06/2012  . Hypothyroidism affecting pregnancy       Plan:   1. Supervision of high risk pregnancy, antepartum Genetic Screening discussed Quad Screen: requested.  Ultrasound discussed; fetal survey: requested.  Follow up in 4 weeks.  - Obstetric Panel, Including HIV -  HgB A1c - CULTURE, URINE COMPREHENSIVE - GC/Chlamydia probe amp ([place]  2. Hypothyroidism affecting pregnancy in first trimester Check TSH - TSH - GC/Chlamydia probe amp ([place]  3. Anti-Duffy antibodies present Antibody screen. Will need to follow titers and follow US  4. Antibody S titer positive in second trimester, single or unspecified fetus Antibody screen  5. Obesity in pregnancy, antepartum  - GC/Chlamydia probe amp ([place])[facility]  6. History of gestational diabetes mellitus (GDM) HgA1c for early screen - GC[facility]Chlamydia probe amp ()[facility]  7. Acute non-recurrent maxillary sinusitis Augmentin x7 days. Discussed mucolytics     Problem list reviewed and updated. 75% of 30 min visit spent on counseling and coordination of care.     Levie HeritageJacob J Stinson 06/25/2017

## 2017-06-25 NOTE — Progress Notes (Signed)
Patient states she has the Duffy antibody. Patient also states she was using Nuva Ring when she go pregnant in NOV. Patient reported last pap in Nov 2018- within normal limits. Armandina Stammer. [place] RNBSN   Bedside ultrasound performed to obtain fetal heart rate due to patient body habitus. Fetal heart rate:168 bpm and fetus active on ultrasound. Armandina StammerJennifer Mayah Urquidi RNBSN

## 2017-06-25 NOTE — Patient Instructions (Signed)

## 2017-06-28 ENCOUNTER — Encounter: Payer: Self-pay | Admitting: Family Medicine

## 2017-06-28 LAB — GC/CHLAMYDIA PROBE AMP (~~LOC~~) NOT AT ARMC
Chlamydia: NEGATIVE
NEISSERIA GONORRHEA: NEGATIVE

## 2017-06-28 MED ORDER — FLUTICASONE PROPIONATE 50 MCG/ACT NA SUSP: 2.0000 | Freq: Every day | NASAL | 2 refills | Status: DC

## 2017-06-29 ENCOUNTER — Encounter: Payer: Self-pay | Admitting: Family Medicine

## 2017-06-29 LAB — OBSTETRIC PANEL, INCLUDING HIV
Basophils Absolute: 0 10*3/uL (ref 0.0–0.2)
Basos: 0 %
EOS (ABSOLUTE): 0 10*3/uL (ref 0.0–0.4)
Eos: 1 %
HEMOGLOBIN: 11.8 g/dL (ref 11.1–15.9)
HIV Screen 4th Generation wRfx: NONREACTIVE
Hematocrit: 37.8 % (ref 34.0–46.6)
Hepatitis B Surface Ag: NEGATIVE
IMMATURE GRANULOCYTES: 0 %
Immature Grans (Abs): 0 10*3/uL (ref 0.0–0.1)
LYMPHS ABS: 1 10*3/uL (ref 0.7–3.1)
Lymphs: 24 %
MCH: 23.4 pg — AB (ref 26.6–33.0)
MCHC: 31.2 g/dL — ABNORMAL LOW (ref 31.5–35.7)
MCV: 75 fL — AB (ref 79–97)
Monocytes Absolute: 0.7 10*3/uL (ref 0.1–0.9)
Monocytes: 17 %
NEUTROS PCT: 58 %
Neutrophils Absolute: 2.3 10*3/uL (ref 1.4–7.0)
Platelets: 210 10*3/uL (ref 150–379)
RBC: 5.05 x10E6/uL (ref 3.77–5.28)
RDW: 17.1 % — ABNORMAL HIGH (ref 12.3–15.4)
RPR Ser Ql: NONREACTIVE
Rh Factor: POSITIVE
Rubella Antibodies, IGG: 1.5 index (ref >0.99)
WBC: 4 10*3/uL (ref 3.4–10.8)

## 2017-06-29 LAB — TSH: TSH: 10.05 u[IU]/mL — AB (ref 0.450–4.500)

## 2017-06-29 LAB — AB SCR+ANTIBODY ID
ANTIBODY SCREEN: POSITIVE — AB
Coombs Titer #1: 16

## 2017-06-29 LAB — HEMOGLOBIN A1C
Est. average glucose Bld gHb Est-mCnc: 108 mg/dL
Hgb A1c MFr Bld: 5.4 % (ref 4.8–5.6)

## 2017-06-29 MED ORDER — LEVOTHYROXINE SODIUM 200 MCG PO TABS: 400.0000 ug | ORAL_TABLET | Freq: Every day | ORAL | 6 refills | Status: DC

## 2017-06-29 NOTE — Addendum Note (Signed)
Addended by: Levie HeritageSTINSON, Jacquelinne Speak J on: 06/29/2017 10:02 AM   Modules accepted: Orders

## 2017-06-30 LAB — CULTURE, URINE COMPREHENSIVE

## 2017-07-09 ENCOUNTER — Telehealth: Payer: Self-pay

## 2017-07-09 ENCOUNTER — Encounter: Payer: Self-pay | Admitting: Family Medicine

## 2017-07-09 NOTE — Telephone Encounter (Signed)
Called pt to ask her if she was on the way to her appointment. Appt was at 11:15 and it was 11:30 when I called. Pt did not answer. Left voicemail letting her know that I was contacting her about her appt and that she can call the office to reschedule

## 2017-07-22 ENCOUNTER — Ambulatory Visit (INDEPENDENT_AMBULATORY_CARE_PROVIDER_SITE_OTHER): Payer: Medicaid Other | Admitting: Family Medicine

## 2017-07-22 VITALS — BP 130/66 | HR 79 | Wt 298.0 lb

## 2017-07-22 DIAGNOSIS — R768 Other specified abnormal immunological findings in serum: Secondary | ICD-10-CM

## 2017-07-22 DIAGNOSIS — O99210 Obesity complicating pregnancy, unspecified trimester: Secondary | ICD-10-CM

## 2017-07-22 DIAGNOSIS — O99281 Endocrine, nutritional and metabolic diseases complicating pregnancy, first trimester: Secondary | ICD-10-CM

## 2017-07-22 DIAGNOSIS — O0992 Supervision of high risk pregnancy, unspecified, second trimester: Secondary | ICD-10-CM

## 2017-07-22 DIAGNOSIS — O0990 Supervision of high risk pregnancy, unspecified, unspecified trimester: Secondary | ICD-10-CM

## 2017-07-22 DIAGNOSIS — O99212 Obesity complicating pregnancy, second trimester: Secondary | ICD-10-CM

## 2017-07-22 DIAGNOSIS — O361920 Maternal care for other isoimmunization, second trimester, not applicable or unspecified: Secondary | ICD-10-CM

## 2017-07-22 DIAGNOSIS — O361910 Maternal care for other isoimmunization, first trimester, not applicable or unspecified: Secondary | ICD-10-CM

## 2017-07-22 DIAGNOSIS — E039 Hypothyroidism, unspecified: Secondary | ICD-10-CM

## 2017-07-22 NOTE — Progress Notes (Signed)
   PRENATAL VISIT NOTE  Subjective:  Joan Becker is a 32 y.o. G2P1001 at 8978w6d being seen today for ongoing prenatal care.  She is currently monitored for the following issues for this high-risk pregnancy and has Hypothyroidism affecting pregnancy; PCOS (polycystic ovarian syndrome); Menometrorrhagia; Isoimmunization in antepartum period; Maternal atypical antibody complicating pregnancy; Anti-Duffy antibodies present; Abdominal pain in pregnancy, antepartum; Obesity in pregnancy, antepartum; Supervision of high risk pregnancy, antepartum; and History of gestational diabetes mellitus (GDM) on their problem list.  Patient reports nasal congestion, sneezing.  Contractions: Not present. Vag. Bleeding: None.   . Denies leaking of fluid.   The following portions of the patient's history were reviewed and updated as appropriate: allergies, current medications, past family history, past medical history, past social history, past surgical history and problem list. Problem list updated.  Objective:   Vitals:   07/22/17 0921  BP: 130/66  Pulse: 79  Weight: 298 lb (135.2 kg)    Fetal Status: Fetal Heart Rate (bpm): 144         General:  Alert, oriented and cooperative. Patient is in no acute distress.  Skin: Skin is warm and dry. No rash noted.   Cardiovascular: Normal heart rate noted  Respiratory: Normal respiratory effort, no problems with respiration noted  Abdomen: Soft, gravid, appropriate for gestational age.  Pain/Pressure: Absent     Pelvic: Cervical exam deferred        Extremities: Normal range of motion.  Edema: None  Mental Status:  Normal mood and affect. Normal behavior. Normal judgment and thought content.   Assessment and Plan:  Pregnancy: G2P1001 at 2978w6d  1. Supervision of high risk pregnancy, antepartum FHT and FH normal  2. Hypothyroidism affecting pregnancy in first trimester Check TSH next appt  3. Anti-Duffy antibodies present Check titer next appt, will refer  to MFM around 18-20 weeks (at time of US)  4. Obesity in pregnancy, antepartum  5. Maternal atypical antibody affecting pregnancy in first trimester, single or unspecified fetus Anti-C titer, too weak to titer.  Preterm labor symptoms and general obstetric precautions including but not limited to vaginal bleeding, contractions, leaking of fluid and fetal movement were reviewed in detail with the patient. Please refer to After Visit Summary for other counseling recommendations.  Return in about 4 weeks (around 08/19/2017) for OB f/u.   Levie HeritageJacob J Stinson, DO

## 2017-07-23 ENCOUNTER — Encounter: Payer: Self-pay | Admitting: Obstetrics & Gynecology

## 2017-08-19 ENCOUNTER — Encounter: Payer: Medicaid Other | Admitting: Family Medicine

## 2017-08-19 ENCOUNTER — Ambulatory Visit (INDEPENDENT_AMBULATORY_CARE_PROVIDER_SITE_OTHER): Payer: Medicaid Other | Admitting: Family Medicine

## 2017-08-19 VITALS — BP 103/56 | HR 77 | Wt 303.0 lb

## 2017-08-19 DIAGNOSIS — E039 Hypothyroidism, unspecified: Secondary | ICD-10-CM

## 2017-08-19 DIAGNOSIS — Z3490 Encounter for supervision of normal pregnancy, unspecified, unspecified trimester: Secondary | ICD-10-CM

## 2017-08-19 DIAGNOSIS — R768 Other specified abnormal immunological findings in serum: Secondary | ICD-10-CM

## 2017-08-19 DIAGNOSIS — Z8632 Personal history of gestational diabetes: Secondary | ICD-10-CM

## 2017-08-19 DIAGNOSIS — O361190 Maternal care for Anti-A sensitization, unspecified trimester, not applicable or unspecified: Secondary | ICD-10-CM

## 2017-08-19 DIAGNOSIS — O0990 Supervision of high risk pregnancy, unspecified, unspecified trimester: Secondary | ICD-10-CM

## 2017-08-19 DIAGNOSIS — O99210 Obesity complicating pregnancy, unspecified trimester: Secondary | ICD-10-CM

## 2017-08-19 DIAGNOSIS — O99281 Endocrine, nutritional and metabolic diseases complicating pregnancy, first trimester: Secondary | ICD-10-CM

## 2017-08-19 NOTE — Progress Notes (Signed)
   PRENATAL VISIT NOTE  Subjective:  Joan Becker is a 32 y.o. G2P1001 at 6363w6d being seen today for ongoing prenatal care.  She is currently monitored for the following issues for this high-risk pregnancy and has Hypothyroidism affecting pregnancy; PCOS (polycystic ovarian syndrome); Menometrorrhagia; Isoimmunization in antepartum period; Maternal atypical antibody complicating pregnancy; Anti-Duffy antibodies present; Abdominal pain in pregnancy, antepartum; Obesity in pregnancy, antepartum; Supervision of high risk pregnancy, antepartum; and History of gestational diabetes mellitus (GDM) on their problem list.  Patient reports aching all over, tired.  Contractions: Not present. Vag. Bleeding: None.  Movement: Present. Denies leaking of fluid.   The following portions of the patient's history were reviewed and updated as appropriate: allergies, current medications, past family history, past medical history, past social history, past surgical history and problem list. Problem list updated.  Objective:   Vitals:   08/19/17 0818  BP: (!) 103/56  Pulse: 77  Weight: (!) 303 lb (137.4 kg)    Fetal Status:     Movement: Present     General:  Alert, oriented and cooperative. Patient is in no acute distress.  Skin: Skin is warm and dry. No rash noted.   Cardiovascular: Normal heart rate noted  Respiratory: Normal respiratory effort, no problems with respiration noted  Abdomen: Soft, gravid, appropriate for gestational age.  Pain/Pressure: Present     Pelvic: Cervical exam deferred        Extremities: Normal range of motion.  Edema: Trace  Mental Status: Normal mood and affect. Normal behavior. Normal judgment and thought content.   Assessment and Plan:  Pregnancy: G2P1001 at 3063w6d  1. Prenatal care, antepartum - US MFM OB DETAIL +14 WK; Future - AFP TETRA - TSH  2. Supervision of high risk pregnancy, antepartum  - Antibody screen; Future  3. Obesity in pregnancy, antepartum -  Antibody screen; Future - Consult to Maternal Fetal Care  4. Isoimmunization, antepartum, single or unspecified fetus Antibody screen today. Refer to MFM - Antibody screen; Future  5. Anti-Duffy antibodies present - Antibody screen; Future - Consult to Maternal Fetal Care  6. History of gestational diabetes mellitus (GDM) Early screen normal. Will rescreen around 24 weeks  7. Hypothyroidism affecting pregnancy in first trimester Check TSH today. If still elevated, may need to check T3 level (May have decreased peripheral conversion of T4 to T3). May need referral to endocrine  Preterm labor symptoms and general obstetric precautions including but not limited to vaginal bleeding, contractions, leaking of fluid and fetal movement were reviewed in detail with the patient. Please refer to After Visit Summary for other counseling recommendations.  No follow-ups on file.  Future Appointments  Date Time Provider Department Center  09/03/2017  9:30 AM [facility] US 5 [facility]  09/17/2017  8:15 AM Willodean RosenthalHarraway-Smith, Carolyn, MD [facility] None    Levie HeritageJacob J Jimi Giza, DO

## 2017-08-20 LAB — TSH: TSH: 11.9 u[IU]/mL — ABNORMAL HIGH (ref 0.450–4.500)

## 2017-08-20 NOTE — Addendum Note (Signed)
Addended by: Levie HeritageSTINSON, Kalmen Lollar J on: 08/20/2017 08:25 AM   Modules accepted: Orders

## 2017-08-21 LAB — AFP TETRA
DIA MOM VALUE: 0.76
DIA VALUE (EIA): 87.89 pg/mL
DSR (By Age)    1 IN: 428
DSR (SECOND TRIMESTER) 1 IN: 2477
GESTATIONAL AGE AFP: 16.6 wk
MATERNAL AGE AT EDD: 33.2 a
MSAFP Mom: 0.58
MSAFP: 13.6 ng/mL
MSHCG MOM: 1.11
MSHCG: 24667 m[IU]/mL
Osb Risk: 10000
Test Results:: NEGATIVE
Weight: 303 [lb_av]
uE3 Mom: 1.25
uE3 Value: 1 ng/mL

## 2017-08-24 ENCOUNTER — Telehealth: Payer: Self-pay

## 2017-08-24 NOTE — Telephone Encounter (Signed)
Attempted to reach patient to have her come back for lab recollection per [facility]. Unable to leave message due to voicemailbox not set up. Armandina StammerJennifer Howard RN

## 2017-08-25 LAB — SPECIMEN STATUS REPORT

## 2017-08-25 LAB — T3, FREE: T3, Free: 2.7 pg/mL (ref 2.0–4.4)

## 2017-08-26 ENCOUNTER — Encounter: Payer: Self-pay | Admitting: Family Medicine

## 2017-08-31 ENCOUNTER — Other Ambulatory Visit: Payer: Self-pay | Admitting: Family Medicine

## 2017-09-01 LAB — AB SCR+ANTIBODY ID
Antibody Screen: POSITIVE — AB
COOMBS TITER #2: 4
Coombs Titer #1: 8

## 2017-09-01 LAB — ANTIBODY SCREEN

## 2017-09-02 ENCOUNTER — Other Ambulatory Visit: Payer: Self-pay | Admitting: Family Medicine

## 2017-09-02 ENCOUNTER — Telehealth: Payer: Self-pay

## 2017-09-02 DIAGNOSIS — E039 Hypothyroidism, unspecified: Secondary | ICD-10-CM

## 2017-09-02 DIAGNOSIS — O99282 Endocrine, nutritional and metabolic diseases complicating pregnancy, second trimester: Principal | ICD-10-CM

## 2017-09-02 MED ORDER — LEVOTHYROXINE SODIUM 50 MCG PO TABS
50.0000 ug | ORAL_TABLET | Freq: Every day | ORAL | 3 refills | Status: DC
Start: 2017-09-02 — End: 2017-10-05

## 2017-09-02 NOTE — Telephone Encounter (Signed)
Called pt to let her know that Dr.Stinson sent Rx for Synthroid 50mcg for her to take along with her Synthroid 400 mcg to make it a total of 450mcg.  I also let pt know that a referral has been put in for endocrinology. Pt states she will pick the Synthroid up from her pharmacy. Ms Joan Becker is working of referral with endocrinology. Pt expresses understanding,

## 2017-09-03 ENCOUNTER — Other Ambulatory Visit: Payer: Self-pay | Admitting: Family Medicine

## 2017-09-03 ENCOUNTER — Ambulatory Visit (HOSPITAL_COMMUNITY)
Admission: RE | Admit: 2017-09-03 | Discharge: 2017-09-03 | Disposition: A | Discharge status: Home / Self Care | Payer: Medicaid Other | Source: Ambulatory Visit | Attending: Family Medicine | Admitting: Family Medicine

## 2017-09-03 ENCOUNTER — Encounter (HOSPITAL_COMMUNITY): Payer: Self-pay

## 2017-09-03 ENCOUNTER — Other Ambulatory Visit (HOSPITAL_COMMUNITY): Payer: Self-pay | Admitting: *Deleted

## 2017-09-03 DIAGNOSIS — Z3A19 19 weeks gestation of pregnancy: Secondary | ICD-10-CM | POA: Diagnosis not present

## 2017-09-03 DIAGNOSIS — Z3689 Encounter for other specified antenatal screening: Secondary | ICD-10-CM

## 2017-09-03 DIAGNOSIS — O360120 Maternal care for anti-D [Rh] antibodies, second trimester, not applicable or unspecified: Secondary | ICD-10-CM | POA: Diagnosis present

## 2017-09-03 DIAGNOSIS — O09292 Supervision of pregnancy with other poor reproductive or obstetric history, second trimester: Secondary | ICD-10-CM | POA: Diagnosis not present

## 2017-09-03 DIAGNOSIS — E039 Hypothyroidism, unspecified: Secondary | ICD-10-CM

## 2017-09-03 DIAGNOSIS — O99212 Obesity complicating pregnancy, second trimester: Secondary | ICD-10-CM | POA: Diagnosis not present

## 2017-09-03 DIAGNOSIS — Z363 Encounter for antenatal screening for malformations: Secondary | ICD-10-CM | POA: Diagnosis not present

## 2017-09-03 DIAGNOSIS — O99282 Endocrine, nutritional and metabolic diseases complicating pregnancy, second trimester: Secondary | ICD-10-CM | POA: Insufficient documentation

## 2017-09-03 DIAGNOSIS — O99280 Endocrine, nutritional and metabolic diseases complicating pregnancy, unspecified trimester: Principal | ICD-10-CM

## 2017-09-03 DIAGNOSIS — Z3490 Encounter for supervision of normal pregnancy, unspecified, unspecified trimester: Secondary | ICD-10-CM

## 2017-09-03 NOTE — Consult Note (Signed)
MATERNAL FETAL MEDICINE CONSULT  Patient Name: Joan Becker Medical Record Number:  [id number] Date of Birth: [birth year] Requesting Physician Name:  Richmond CampbellKaplan, Kristen W., PA-C Date of Service: 09/03/2017  Chief Complaint Anti-Fya and anti-C alloimmunization  History of Present Illness Joan Becker is a 32 y.o. G2P1001,at 3981w0d with an EDD of 01/28/2018 that I am asked to see by Mady GemmaKristen Kaplan, PA-C for anti-Fya and anti-C alloimmunization.  Joan Becker had a blood transfusion due to anemia from heavy menses prior to her first pregnancy.  In her first pregnancy she was found to have anti-Fya antibodies on her NOB labs.  Her husband was tested and found to be heterozygous for Fya.  That pregnancy was not effected by significant alloimmunization.  Her initial antibody screen in this pregnancy was positive for anti-Fya and anti-C, the most recent titers of which were 16 and 4 respectively.  Her husband has not had C antigen genotyping.  She has no acute issues or complaints today.  Review of Systems Pertinent items are noted in HPI.  Patient History OB History  Gravida Para Term Preterm AB Living  2 1 1  0 0 1  SAB TAB Ectopic Multiple Live Births  0 0 0 0 1    # Outcome Date GA Lbr Len/2nd Weight Sex Delivery Anes PTL Lv  2 Current           1 Term 08/18/14 6922w0d 04:01 / 01:39 7 lb 12.7 oz (3.535 kg) F Vag-Spont EPI  LIV    Past Medical History:  Diagnosis Date  . Abdominal pain in pregnancy, antepartum 06/06/2014  . Gestational diabetes   . Hypothyroidism   . Kidney stones   . Obesity   . PCOS (polycystic ovarian syndrome)   . Postpartum care following vaginal delivery (4/9) 08/19/2014  . Thyroid disease     Past Surgical History:  Procedure Laterality Date  . ADENOIDECTOMY    . HYSTEROSCOPY W/D&C N/A 08/11/2012   Procedure: DILATATION AND CURETTAGE /HYSTEROSCOPY;  Surgeon: Adam PhenixJames G Arnold, MD;  Location: [facility];  Service: Gynecology;  Laterality: N/A;  . MYRINGOTOMY    .  TONSILLECTOMY    . WISDOM TOOTH EXTRACTION      Social History   Socioeconomic History  . Marital status: Married    Spouse name: Not on file  . Number of children: Not on file  . Years of education: Not on file  . Highest education level: Not on file  Occupational History  . Not on file  Social Needs  . Financial resource strain: Not on file  . Food insecurity:    Worry: Not on file    Inability: Not on file  . Transportation needs:    Medical: Not on file    Non-medical: Not on file  Tobacco Use  . Smoking status: Never Smoker  . Smokeless tobacco: Never Used  Substance and Sexual Activity  . Alcohol use: No  . Drug use: No  . Sexual activity: Yes  Lifestyle  . Physical activity:    Days per week: Not on file    Minutes per session: Not on file  . Stress: Not on file  Relationships  . Social connections:    Talks on phone: Not on file    Gets together: Not on file    Attends religious service: Not on file    Active member of club or organization: Not on file    Attends meetings of clubs or organizations: Not on file  Relationship status: Not on file  Other Topics Concern  . Not on file  Social History Narrative  . Not on file    Family History  Problem Relation Age of Onset  . Hearing loss Maternal Aunt   . Hearing loss Maternal Uncle   . Hearing loss Paternal Aunt   . Hearing loss Paternal Uncle   . Hearing loss Maternal Grandmother   . Hearing loss Maternal Grandfather   . Hearing loss Paternal Grandmother   . Hearing loss Paternal Grandfather   . Diabetes Father   . Hypertension Father   . Cancer Neg Hx    In addition, the patient has no family history of mental retardation, birth defects, or genetic diseases.  Physical Examination There were no vitals filed for this visit. General appearance - alert, well appearing, and in no distress Mental status - alert, oriented to person, place, and time  Assessment and Recommendations 1.  Anti-Fya  and anti-C alloimmunization.  Joan Becker has anti-Fya and anti-C antibodies with titers of 16 and 4, respectively.  We discussed the nature of alloimmunization, the potential for significant fetal anemia, paternal and fetal genotyping, the monitoring for anemia using MCA doppler, and the treatment of alloimmunization with fetal transfusion.  Her husband was found to be heterozygous for the Fya antigen indicating a 50% chance the fetus is also Fya antigen positive.  He has not had C antigen genotyping done.  Her MCA Dopplers today were normal at 1.12 MoM.  They should be repeated every 2 weeks, butrequency should be increased to every week if MCA peak systolic velocities begin to increase, suggesting evolving anemia.  She should also begin once or twice weekly fetal testing beginning at 32 weeks, or earlier if evidence of anemia is discovered.  We discussed the possibility performing paternal genotyping for the C antigen or fetal Rh genotyping for the C and Fya antigens.  Paternal C antigen genotyping can be done at any time.  She is uncertain if she wishes to proceed with amniocentesis for fetal genotyping, but will consider it further.  I spent 30 minutes with Joan Becker today of which 50% was face-to-face counseling.  Thank you for referring Joan Becker to the [facility].  Please do not hesitate to contact us with questions.   Rema Fendt, MD

## 2017-09-06 ENCOUNTER — Telehealth: Payer: Self-pay

## 2017-09-06 NOTE — Telephone Encounter (Signed)
Patient already been reached about increase in Synthroid and referral. Armandina Stammer RN

## 2017-09-17 ENCOUNTER — Ambulatory Visit (INDEPENDENT_AMBULATORY_CARE_PROVIDER_SITE_OTHER): Payer: Medicaid Other | Admitting: Obstetrics & Gynecology

## 2017-09-17 ENCOUNTER — Encounter: Payer: Self-pay | Admitting: Obstetrics & Gynecology

## 2017-09-17 VITALS — BP 111/61 | HR 75 | Wt 307.0 lb

## 2017-09-17 DIAGNOSIS — O361190 Maternal care for Anti-A sensitization, unspecified trimester, not applicable or unspecified: Secondary | ICD-10-CM

## 2017-09-17 DIAGNOSIS — Z8632 Personal history of gestational diabetes: Secondary | ICD-10-CM

## 2017-09-17 DIAGNOSIS — O99210 Obesity complicating pregnancy, unspecified trimester: Secondary | ICD-10-CM

## 2017-09-17 DIAGNOSIS — O26899 Other specified pregnancy related conditions, unspecified trimester: Secondary | ICD-10-CM

## 2017-09-17 DIAGNOSIS — O0990 Supervision of high risk pregnancy, unspecified, unspecified trimester: Secondary | ICD-10-CM

## 2017-09-17 DIAGNOSIS — O99282 Endocrine, nutritional and metabolic diseases complicating pregnancy, second trimester: Secondary | ICD-10-CM

## 2017-09-17 DIAGNOSIS — R109 Unspecified abdominal pain: Secondary | ICD-10-CM

## 2017-09-17 DIAGNOSIS — R768 Other specified abnormal immunological findings in serum: Secondary | ICD-10-CM

## 2017-09-17 DIAGNOSIS — E039 Hypothyroidism, unspecified: Secondary | ICD-10-CM

## 2017-09-17 DIAGNOSIS — O361910 Maternal care for other isoimmunization, first trimester, not applicable or unspecified: Secondary | ICD-10-CM

## 2017-09-17 NOTE — Progress Notes (Signed)
   PRENATAL VISIT NOTE  Subjective:  Joan Becker is a 32 y.o. G2P1001 at [redacted]w[redacted]d being seen today for ongoing prenatal care.  She is currently monitored for the following issues for this high-risk pregnancy and has Hypothyroidism affecting pregnancy; PCOS (polycystic ovarian syndrome); Menometrorrhagia; Isoimmunization in antepartum period; Maternal atypical antibody complicating pregnancy; Anti-Duffy antibodies present; Abdominal pain in pregnancy, antepartum; Obesity in pregnancy, antepartum; Supervision of high risk pregnancy, antepartum; and History of gestational diabetes mellitus (GDM) on their problem list.  Patient reports pt reports feeling more pressure than in her prev pregnancy. .  Contractions: Not present. Vag. Bleeding: None.  Movement: Present. Denies leaking of fluid.   The following portions of the patient's history were reviewed and updated as appropriate: allergies, current medications, past family history, past medical history, past social history, past surgical history and problem list. Problem list updated.  Objective:   Vitals:   09/17/17 0813  BP: 111/61  Pulse: 75  Weight: (!) 307 lb (139.3 kg)    Fetal Status:     Movement: Present     General:  Alert, oriented and cooperative. Patient is in no acute distress.  Skin: Skin is warm and dry. No rash noted.   Cardiovascular: Normal heart rate noted  Respiratory: Normal respiratory effort, no problems with respiration noted  Abdomen: Soft, gravid, appropriate for gestational age.  Pain/Pressure: Present     Pelvic: Cervical exam deferred        Extremities: Normal range of motion.  Edema: None  Mental Status: Normal mood and affect. Normal behavior. Normal judgment and thought content.   Assessment and Plan:  Pregnancy: G2P1001 at [redacted]w[redacted]d  1. Supervision of high risk pregnancy, antepartum  2. Obesity in pregnancy, antepartum Unable to check fundal height due to body habitus.  Follow growth with [place]  3.  Maternal atypical antibody affecting pregnancy in first trimester, single or unspecified fetus  4. Isoimmunization, antepartum, single or unspecified fetus q2 week MCA dopplers by MFM.  antenatal testing beginning at 32 weeks or sooner if MCA dopplers are abnormal   5. History of gestational diabetes mellitus (GDM) Need an early 2 hours GTT  6. Hypothyroidism affecting pregnancy in second trimester  7. Anti-Duffy antibodies present  8. Abdominal pain in pregnancy, antepartum Seen in MAU  Preterm labor symptoms and general obstetric precautions including but not limited to vaginal bleeding, contractions, leaking of fluid and fetal movement were reviewed in detail with the patient. Please refer to After Visit Summary for other counseling recommendations.  No follow-ups on file.  Future Appointments  Date Time Provider Department Center  09/20/2017  7:45 AM [facility] [place] 2 [facility]    Willodean Rosenthal, MD

## 2017-09-20 ENCOUNTER — Ambulatory Visit (HOSPITAL_COMMUNITY)
Admission: RE | Admit: 2017-09-20 | Discharge: 2017-09-20 | Disposition: A | Discharge status: Home / Self Care | Payer: Medicaid Other | Source: Ambulatory Visit | Attending: Family Medicine | Admitting: Family Medicine

## 2017-09-20 ENCOUNTER — Encounter (HOSPITAL_COMMUNITY): Payer: Self-pay

## 2017-09-20 ENCOUNTER — Other Ambulatory Visit (HOSPITAL_COMMUNITY): Payer: Self-pay | Admitting: *Deleted

## 2017-09-20 DIAGNOSIS — O99282 Endocrine, nutritional and metabolic diseases complicating pregnancy, second trimester: Secondary | ICD-10-CM | POA: Insufficient documentation

## 2017-09-20 DIAGNOSIS — E039 Hypothyroidism, unspecified: Secondary | ICD-10-CM | POA: Diagnosis present

## 2017-09-20 DIAGNOSIS — Z3A21 21 weeks gestation of pregnancy: Secondary | ICD-10-CM | POA: Insufficient documentation

## 2017-09-20 DIAGNOSIS — O361120 Maternal care for Anti-A sensitization, second trimester, not applicable or unspecified: Secondary | ICD-10-CM

## 2017-09-20 DIAGNOSIS — O99280 Endocrine, nutritional and metabolic diseases complicating pregnancy, unspecified trimester: Secondary | ICD-10-CM

## 2017-09-20 NOTE — Addendum Note (Signed)
Encounter addended by: Drue Novel, RDMS on: 09/20/2017 8:38 AM  Actions taken: Imaging Exam ended

## 2017-09-21 ENCOUNTER — Other Ambulatory Visit: Payer: Medicaid Other

## 2017-09-21 DIAGNOSIS — Z3490 Encounter for supervision of normal pregnancy, unspecified, unspecified trimester: Secondary | ICD-10-CM

## 2017-09-21 NOTE — Progress Notes (Signed)
Patient presents for gtt.  Sent to lab. Armandina Stammer RN

## 2017-09-22 ENCOUNTER — Encounter: Payer: Self-pay | Admitting: Family Medicine

## 2017-09-24 LAB — CBC
Hematocrit: 34.2 % (ref 34.0–46.6)
Hemoglobin: 11.1 g/dL (ref 11.1–15.9)
MCH: 24.2 pg — AB (ref 26.6–33.0)
MCHC: 32.5 g/dL (ref 31.5–35.7)
MCV: 75 fL — ABNORMAL LOW (ref 79–97)
PLATELETS: 198 10*3/uL (ref 150–379)
RBC: 4.58 x10E6/uL (ref 3.77–5.28)
RDW: 17.8 % — AB (ref 12.3–15.4)
WBC: 6 10*3/uL (ref 3.4–10.8)

## 2017-09-24 LAB — GLUCOSE TOLERANCE, 2 HOURS W/ 1HR
GLUCOSE, 1 HOUR: 160 mg/dL (ref 65–179)
GLUCOSE, 2 HOUR: 100 mg/dL (ref 65–152)
GLUCOSE, FASTING: 89 mg/dL (ref 65–91)

## 2017-09-24 LAB — HIV ANTIBODY (ROUTINE TESTING W REFLEX): HIV Screen 4th Generation wRfx: NONREACTIVE

## 2017-09-24 LAB — RPR: RPR: NONREACTIVE

## 2017-09-27 ENCOUNTER — Encounter: Payer: Self-pay | Admitting: Family Medicine

## 2017-09-28 ENCOUNTER — Encounter (HOSPITAL_COMMUNITY): Payer: Self-pay | Admitting: *Deleted

## 2017-09-28 ENCOUNTER — Other Ambulatory Visit: Payer: Self-pay

## 2017-09-28 ENCOUNTER — Inpatient Hospital Stay (HOSPITAL_COMMUNITY)
Admission: AD | Admit: 2017-09-28 | Discharge: 2017-09-29 | Disposition: A | Discharge status: Home / Self Care | Payer: Medicaid Other | Source: Ambulatory Visit | Attending: Obstetrics and Gynecology | Admitting: Obstetrics and Gynecology

## 2017-09-28 ENCOUNTER — Telehealth: Payer: Self-pay

## 2017-09-28 DIAGNOSIS — M79604 Pain in right leg: Secondary | ICD-10-CM

## 2017-09-28 DIAGNOSIS — O26892 Other specified pregnancy related conditions, second trimester: Secondary | ICD-10-CM | POA: Insufficient documentation

## 2017-09-28 DIAGNOSIS — R0989 Other specified symptoms and signs involving the circulatory and respiratory systems: Secondary | ICD-10-CM

## 2017-09-28 DIAGNOSIS — Z3A22 22 weeks gestation of pregnancy: Secondary | ICD-10-CM | POA: Insufficient documentation

## 2017-09-28 NOTE — Telephone Encounter (Signed)
Tried to reach patient via phone as she needs to be seen today (in regards to concerns in her patient email). No answer and unable to leave message. Will send another my chart message. Armandina Stammer RN

## 2017-09-28 NOTE — MAU Note (Signed)
Pt reports swelling since Friday. R leg pain since Saturday or Sunday. Pt describes the pain as achy.

## 2017-09-28 NOTE — MAU Provider Note (Signed)
Chief Complaint:  Leg Swelling and Leg Pain   First Provider Initiated Contact with Patient 09/28/17 2318     HPI: Joan Becker is a 32 y.o. G2P1001 at 52w4dwho presents to maternity admissions reporting pain in right leg, behind knee..States feels like right leg is more swollen than left. .No history of DVT She reports good fetal movement, denies LOF, vaginal bleeding, vaginal itching/burning, urinary symptoms, h/a, dizziness, n/v, diarrhea, constipation or fever/chills. .  Leg Pain   The incident occurred 2 days ago. There was no injury mechanism. The pain is present in the right leg. The quality of the pain is described as aching. Pertinent negatives include no inability to bear weight, loss of motion, loss of sensation, muscle weakness, numbness or tingling. She reports no foreign bodies present. The symptoms are aggravated by movement. She has tried nothing for the symptoms.    RN Note: Pt reports swelling since Friday. R leg pain since Saturday or Sunday. Pt describes the pain as achy.     Past Medical History: Past Medical History:  Diagnosis Date  . Abdominal pain in pregnancy, antepartum 06/06/2014  . Gestational diabetes   . Hypothyroidism   . Kidney stones   . Obesity   . PCOS (polycystic ovarian syndrome)   . Postpartum care following vaginal delivery (4/9) 08/19/2014  . Thyroid disease     Past obstetric history: OB History  Gravida Para Term Preterm AB Living  0 0 1  SAB TAB Ectopic Multiple Live Births  0 0 0 0 1    # Outcome Date GA Lbr Len/2nd Weight Sex Delivery Anes PTL Lv  2 Current           1 Term 08/18/14 [redacted]w[redacted]d 04:01 / 01:39 7 lb 12.7 oz (3.535 kg) F Vag-Spont EPI  LIV    Past Surgical History: Past Surgical History:  Procedure Laterality Date  . ADENOIDECTOMY    . HYSTEROSCOPY W/D&C N/A 08/11/2012   Procedure: DILATATION AND CURETTAGE /HYSTEROSCOPY;  Surgeon: Adam Phenix, MD;  Location: [facility];  Service: Gynecology;  Laterality: N/A;  .  MYRINGOTOMY    . TONSILLECTOMY    . WISDOM TOOTH EXTRACTION      Family History: Family History  Problem Relation Age of Onset  . Hearing loss Maternal Aunt   . Hearing loss Maternal Uncle   . Hearing loss Paternal Aunt   . Hearing loss Paternal Uncle   . Hearing loss Maternal Grandmother   . Hearing loss Maternal Grandfather   . Hearing loss Paternal Grandmother   . Hearing loss Paternal Grandfather   . Diabetes Father   . Hypertension Father   . Cancer Neg Hx     Social History: Social History   Tobacco Use  . Smoking status: Never Smoker  . Smokeless tobacco: Never Used  Substance Use Topics  . Alcohol use: No  . Drug use: No    Allergies: No Known Allergies  Meds:  Medications Prior to Admission  Medication Sig Dispense Refill Last Dose  . calcium carbonate (TUMS - DOSED IN MG ELEMENTAL CALCIUM) 500 MG chewable tablet Chew 2 tablets by mouth as needed for indigestion or heartburn. Patient takes 5-6 times daily prn.   Past Week at Unknown time  . levothyroxine (SYNTHROID) 50 MCG tablet Take 1 tablet (50 mcg total) by mouth daily before breakfast. 30 tablet 3 09/28/2017 at Unknown time  . levothyroxine (SYNTHROID, LEVOTHROID) 200 MCG tablet Take 2 tablets (400 mcg total) by  mouth daily before breakfast. 60 tablet 6 09/28/2017 at Unknown time  . Prenatal Vit-Fe Fumarate-FA (PRENATAL MULTIVITAMIN) TABS tablet Take 1 tablet by mouth at bedtime.    09/28/2017 at Unknown time  . Cholecalciferol (VITAMIN D3) 1000 UNITS CAPS Take 1 capsule by mouth at bedtime.    More than a month at Unknown time  . fluticasone (FLONASE) 50 MCG/ACT nasal spray Place 2 sprays into both nostrils daily. (Patient not taking: Reported on 09/20/2017) 16 g 2 Not Taking    I have reviewed patient's Past Medical Hx, Surgical Hx, Family Hx, Social Hx, medications and allergies.   ROS:  Review of Systems  Respiratory: Negative for shortness of breath.   Cardiovascular: Positive for leg swelling.  Negative for chest pain.  Gastrointestinal: Negative for nausea and vomiting.  Musculoskeletal: Negative for back pain and joint swelling.  Neurological: Negative for dizziness, tingling, weakness, numbness and headaches.   Other systems negative  Physical Exam   Patient Vitals for the past 24 hrs:  BP Temp Temp src Pulse Resp SpO2 Height Weight  09/28/17 2228 134/79 97.9 F (36.6 C) Oral 79 18 100 %  (1.702 m) (!) 320 lb (145.2 kg)   Constitutional: Well-developed, well-nourished female in no acute distress.  Cardiovascular: normal rate and rhythm Respiratory: normal effort, clear to auscultation bilaterally GI: Abd soft, non-tender, gravid appropriate for gestational age.   No rebound or guarding. MS: Extremities nontender on left, tender to foot flexion on right.  Trace andle edema (bilaterally, not different by exam), normal ROM    Left 34cm, Right 35cm Neurologic: Alert and oriented x 4.  GU: Neg CVAT.  PELVIC EXAM: Deferred  FHT:  150   Labs: A/Positive/-- (02/15 0940) Results for orders placed or performed during the hospital encounter of 09/28/17 (from the past 24 hour(s))  CBC     Status: Abnormal   Collection Time: 09/29/17 12:21 AM  Result Value Ref Range   WBC 7.3 4.0 - 10.5 K/uL   RBC 3.95 3.87 - 5.11 MIL/uL   Hemoglobin 9.8 (L) 12.0 - 15.0 g/dL   HCT 69.6 (L) 29.5 - 28.4 %   MCV 80.3 78.0 - 100.0 fL   MCH 24.8 (L) 26.0 - 34.0 pg   MCHC 30.9 30.0 - 36.0 g/dL   RDW 13.2 (H) 44.0 - 10.2 %   Platelets 169 150 - 400 K/uL  Basic metabolic panel     Status: Abnormal   Collection Time: 09/29/17 12:21 AM  Result Value Ref Range   Sodium 136 135 - 145 mmol/L   Potassium 3.4 (L) 3.5 - 5.1 mmol/L   Chloride 106 101 - 111 mmol/L   CO2 21 (L) 22 - 32 mmol/L   Glucose, Bld 96 65 - 99 mg/dL   BUN 9 6 - 20 mg/dL   Creatinine, Ser 7.25 0.44 - 1.00 mg/dL   Calcium 8.7 (L) 8.9 - 10.3 mg/dL   GFR calc non Af Amer >60 >60 mL/min   GFR calc Af Amer >60 >60 mL/min    Anion gap 9 5 - 15    Imaging:  Dopplers ordered for tomorrow  MAU Course/MDM: I have ordered labs and reviewed results.  These are WNL  Consult Dr Vergie Living with presentation, exam findings and test results.  He recommends prophylactic Lovenox and Dopplers for tomorrow Treatments in MAU included Lovenox (treatment dose).    Assessment: Right leg pain Subjective edema right leg Cannot rule out DVT  Plan: Discharge home Rx Lovenox for  use if Dopplers positive Doppler US ordered for tomorrow, RN messaged to arrange Follow up in Office for prenatal visits and recheck of status  Encouraged to return here or to other Urgent Care/ED if she develops worsening of symptoms, increase in pain, fever, or other concerning symptoms.   Pt stable at time of discharge.  Wynelle Bourgeois CNM, MSN Certified Nurse-Midwife 09/28/2017 11:18 PM

## 2017-09-29 ENCOUNTER — Telehealth: Payer: Self-pay

## 2017-09-29 ENCOUNTER — Ambulatory Visit (HOSPITAL_BASED_OUTPATIENT_CLINIC_OR_DEPARTMENT_OTHER)
Admission: RE | Admit: 2017-09-29 | Discharge: 2017-09-29 | Disposition: A | Discharge status: Home / Self Care | Payer: Medicaid Other | Source: Ambulatory Visit | Attending: Advanced Practice Midwife | Admitting: Advanced Practice Midwife

## 2017-09-29 DIAGNOSIS — Z3A22 22 weeks gestation of pregnancy: Secondary | ICD-10-CM | POA: Diagnosis not present

## 2017-09-29 DIAGNOSIS — M79604 Pain in right leg: Secondary | ICD-10-CM | POA: Diagnosis not present

## 2017-09-29 DIAGNOSIS — O26892 Other specified pregnancy related conditions, second trimester: Secondary | ICD-10-CM | POA: Diagnosis not present

## 2017-09-29 LAB — BASIC METABOLIC PANEL
ANION GAP: 9 (ref 5–15)
BUN: 9 mg/dL (ref 6–20)
CO2: 21 mmol/L — AB (ref 22–32)
Calcium: 8.7 mg/dL — ABNORMAL LOW (ref 8.9–10.3)
Chloride: 106 mmol/L (ref 101–111)
Creatinine, Ser: 0.61 mg/dL (ref 0.44–1.00)
GLUCOSE: 96 mg/dL (ref 65–99)
POTASSIUM: 3.4 mmol/L — AB (ref 3.5–5.1)
Sodium: 136 mmol/L (ref 135–145)

## 2017-09-29 LAB — CBC
HEMATOCRIT: 31.7 % — AB (ref 36.0–46.0)
Hemoglobin: 9.8 g/dL — ABNORMAL LOW (ref 12.0–15.0)
MCH: 24.8 pg — ABNORMAL LOW (ref 26.0–34.0)
MCHC: 30.9 g/dL (ref 30.0–36.0)
MCV: 80.3 fL (ref 78.0–100.0)
Platelets: 169 10*3/uL (ref 150–400)
RBC: 3.95 MIL/uL (ref 3.87–5.11)
RDW: 17.2 % — ABNORMAL HIGH (ref 11.5–15.5)
WBC: 7.3 10*3/uL (ref 4.0–10.5)

## 2017-09-29 MED ORDER — ENOXAPARIN SODIUM 150 MG/ML ~~LOC~~ SOLN: 1.0000 mg/kg | SUBCUTANEOUS | 0 refills | Status: DC

## 2017-09-29 MED ORDER — ENOXAPARIN SODIUM 150 MG/ML ~~LOC~~ SOLN
1.0000 mg/kg | Freq: Once | SUBCUTANEOUS | Status: AC
  Administered 2017-09-29: 145 mg via SUBCUTANEOUS
  Filled 2017-09-29: qty 0.97

## 2017-09-29 NOTE — Telephone Encounter (Signed)
Changed order for dopplers so that patient can get it done today in [facility]. Armandina Stammer RN

## 2017-09-29 NOTE — Telephone Encounter (Signed)
-----   Message from Aviva Signs, CNM sent at 09/29/2017 12:18 AM EDT ----- Regarding: Needs Doppler Right leg No swelling but had pain behind knee  Gave her one dose of Lovenox Needs Doppler on Wed  Right leg  I put order in   Hilda Lias

## 2017-09-29 NOTE — Discharge Instructions (Signed)
Deep Vein Thrombosis Deep vein thrombosis (DVT) is a condition in which a blood clot forms in a deep vein, such as a lower leg, thigh, or arm vein. A clot is blood that has thickened into a gel or solid. This condition is dangerous. It can lead to serious and even life-threatening complications if the clot travels to the lungs and causes a blockage (pulmonary embolism). It can also damage veins in the leg. This can result in leg pain, swelling, discoloration, and sores (post-thrombotic syndrome). What are the causes? This condition may be caused by:  A slowdown of blood flow.  Damage to a vein.  A condition that makes blood clot more easily.  What increases the risk? The following factors may make you more likely to develop this condition:  Being overweight.  Being elderly, especially over age 20.  Sitting or lying down for more than four hours.  Lack of physical activity (sedentary lifestyle).  Being pregnant, giving birth, or having recently given birth.  Taking medicines that contain estrogen.  Smoking.  A history of any of the following: ? Blood clots or blood clotting disease. ? Peripheral vascular disease. ? Inflammatory bowel disease. ? Cancer. ? Heart disease. ? Genetic conditions that affect how blood clots. ? Neurological diseases that affect the legs (leg paresis). ? Injury. ? Major or lengthy surgery. ? A central line placed inside a large vein.  What are the signs or symptoms? Symptoms of this condition include:  Swelling, pain, or tenderness in an arm or leg.  Warmth, redness, or discoloration in an arm or leg.  If the clot is in your leg, symptoms may be more noticeable or worse when you stand or walk. Some people do not have any symptoms. How is this diagnosed? This condition is diagnosed with:  A medical history.  A physical exam.  Tests, such as: ? Blood tests. These are done to see how your blood clots. ? Imaging tests. These are done to  check for clots. Tests may include:  Ultrasound.  CT scan.  MRI.  X-ray.  Venogram. For this test, X-rays are taken after a dye is injected into a vein.  How is this treated? Treatment for this condition depends on the cause, your risk for bleeding or developing more clots, and any medical conditions you have. Treatment may include:  Taking blood thinners (also called anticoagulants). These medicines may be taken by mouth, injected under the skin, or injected through an IV tube (catheter). These medicines prevent clots from forming.  Injecting medicine that dissolves blood clots into the affected vein (catheter-directed thrombolysis).  Having surgery. Surgery may be done to: ? Remove the clot. ? Place a filter in a large vein to catch blood clots before they reach the lungs.  Some treatments may be continued for up to six months. Follow these instructions at home: If you are taking an oral blood thinner:  Take the medicine exactly as told by your health care provider. Some blood thinners need to be taken at the same time every day. Do not skip a dose.  Ask your health care provider about what foods and drugs interact with the medicine.  Ask about possible side effects. General instructions  Blood thinners can cause easy bruising and difficulty stopping bleeding. Because of this, if you are taking or were given a blood thinner: ? Hold pressure over cuts for longer than usual. ? Tell your dentist and other health care providers that you are taking blood thinners before  having any procedures that can cause bleeding. ? Avoid contact sports.  Take over-the-counter and prescription medicines only as told by your health care provider.  Return to your normal activities as told by your health care provider. Ask your health care provider what activities are safe for you.  Wear compression stockings if recommended by your health care provider.  Keep all follow-up visits as told by  your health care provider. This is important. How is this prevented? To lower your risk of developing this condition again:  For 30 or more minutes every day, do an activity that: ? Involves moving your arms and legs. ? Increases your heart rate.  When traveling for longer than four hours: ? Exercise your arms and legs every hour. ? Drink plenty of water. ? Avoid drinking alcohol.  Avoid sitting or lying for a long time without moving your legs.  Stay a healthy weight.  If you are a woman who is older than age 34, avoid unnecessary use of medicines that contain estrogen.  Do not use any products that contain nicotine or tobacco, such as cigarettes and e-cigarettes. This is especially important if you take estrogen medicines. If you need help quitting, ask your health care provider.  Contact a health care provider if:  You miss a dose of your blood thinner.  You have nausea, vomiting, or diarrhea that lasts for more than one day.  Your menstrual period is heavier than usual.  You have unusual bruising. Get help right away if:  You have new or increased pain, swelling, or redness in an arm or leg.  You have numbness or tingling in an arm or leg.  You have shortness of breath.  You have chest pain.  You have a rapid or irregular heartbeat.  You feel light-headed or dizzy.  You cough up blood.  There is blood in your vomit, stool, or urine.  You have a serious fall or accident, or you hit your head.  You have a severe headache or confusion.  You have a cut that will not stop bleeding. These symptoms may represent a serious problem that is an emergency. Do not wait to see if the symptoms will go away. Get medical help right away. Call your local emergency services (911 in the [place] Do not drive yourself to the hospital. Summary  DVT is a condition in which a blood clot forms in a deep vein, such as a lower leg, thigh, or arm vein.  Symptoms can include swelling,  warmth, pain, and redness in your leg or arm.  Treatment may include taking blood thinners, injecting medicine that dissolves blood clots,wearing compression stockings, or surgery.  If you are prescribed blood thinners, take them exactly as told. This information is not intended to replace advice given to you by your health care provider. Make sure you discuss any questions you have with your health care provider. Document Released: 04/27/2005 Document Revised: 05/30/2016 Document Reviewed: 05/30/2016 Elsevier Interactive Patient Education  2018 [employer]. Enoxaparin injection What is this medicine? ENOXAPARIN (ee nox a PA rin) is used after knee, hip, or abdominal surgeries to prevent blood clotting. It is also used to treat existing blood clots in the lungs or in the veins. This medicine may be used for other purposes; ask your health care provider or pharmacist if you have questions. COMMON BRAND NAME(S): Lovenox What should I tell my health care provider before I take this medicine? They need to know if you have any of these  conditions: -bleeding disorders, hemorrhage, or hemophilia -infection of the heart or heart valves -kidney or liver disease -previous stroke -prosthetic heart valve -recent surgery or delivery of a baby -ulcer in the stomach or intestine, diverticulitis, or other bowel disease -an unusual or allergic reaction to enoxaparin, heparin, pork or pork products, other medicines, foods, dyes, or preservatives -pregnant or trying to get pregnant -breast-feeding How should I use this medicine? This medicine is for injection under the skin. It is usually given by a health-care professional. You or a family member may be trained on how to give the injections. If you are to give yourself injections, make sure you understand how to use the syringe, measure the dose if necessary, and give the injection. To avoid bruising, do not rub the site where this medicine has been  injected. Do not take your medicine more often than directed. Do not stop taking except on the advice of your doctor or health care professional. Make sure you receive a puncture-resistant container to dispose of the needles and syringes once you have finished with them. Do not reuse these items. Return the container to your doctor or health care professional for proper disposal. Talk to your pediatrician regarding the use of this medicine in children. Special care may be needed. Overdosage: If you think you have taken too much of this medicine contact a poison control center or emergency room at once. NOTE: This medicine is only for you. Do not share this medicine with others. What if I miss a dose? If you miss a dose, take it as soon as you can. If it is almost time for your next dose, take only that dose. Do not take double or extra doses. What may interact with this medicine? -aspirin and aspirin-like medicines -certain medicines that treat or prevent blood clots -dipyridamole -NSAIDs, medicines for pain and inflammation, like ibuprofen or naproxen This list may not describe all possible interactions. Give your health care provider a list of all the medicines, herbs, non-prescription drugs, or dietary supplements you use. Also tell them if you smoke, drink alcohol, or use illegal drugs. Some items may interact with your medicine. What should I watch for while using this medicine? Visit your doctor or health care professional for regular checks on your progress. Your condition will be monitored carefully while you are receiving this medicine. Notify your doctor or health care professional and seek emergency treatment if you develop breathing problems; changes in vision; chest pain; severe, sudden headache; pain, swelling, warmth in the leg; trouble speaking; sudden numbness or weakness of the face, arm, or leg. These can be signs that your condition has gotten worse. If you are going to have  surgery, tell your doctor or health care professional that you are taking this medicine. Do not stop taking this medicine without first talking to your doctor. Be sure to refill your prescription before you run out of medicine. Avoid sports and activities that might cause injury while you are using this medicine. Severe falls or injuries can cause unseen bleeding. Be careful when using sharp tools or knives. Consider using an Neurosurgeon. Take special care brushing or flossing your teeth. Report any injuries, bruising, or red spots on the skin to your doctor or health care professional. What side effects may I notice from receiving this medicine? Side effects that you should report to your doctor or health care professional as soon as possible: -allergic reactions like skin rash, itching or hives, swelling of the face, lips,  or tongue -feeling faint or lightheaded, falls -signs and symptoms of bleeding such as bloody or black, tarry stools; red or dark-brown urine; spitting up blood or brown material that looks like coffee grounds; red spots on the skin; unusual bruising or bleeding from the eye, gums, or nose Side effects that usually do not require medical attention (report to your doctor or health care professional if they continue or are bothersome): -pain, redness, or irritation at site where injected This list may not describe all possible side effects. Call your doctor for medical advice about side effects. You may report side effects to FDA at 1-800-FDA-1088. Where should I keep my medicine? Keep out of the reach of children. Store at room temperature between 15 and 30 degrees C (59 and 86 degrees F). Do not freeze. If your injections have been specially prepared, you may need to store them in the refrigerator. Ask your pharmacist. Throw away any unused medicine after the expiration date. NOTE: This sheet is a summary. It may not cover all possible information. If you have questions about  this medicine, talk to your doctor, pharmacist, or health care provider.  2018 Elsevier/Gold Standard (2013-08-29 16:06:21)

## 2017-10-05 ENCOUNTER — Other Ambulatory Visit (HOSPITAL_COMMUNITY): Payer: Self-pay | Admitting: Obstetrics and Gynecology

## 2017-10-05 ENCOUNTER — Ambulatory Visit (HOSPITAL_COMMUNITY)
Admission: RE | Admit: 2017-10-05 | Discharge: 2017-10-05 | Disposition: A | Discharge status: Home / Self Care | Payer: Medicaid Other | Source: Ambulatory Visit | Attending: Family Medicine | Admitting: Family Medicine

## 2017-10-05 ENCOUNTER — Encounter (HOSPITAL_COMMUNITY): Payer: Self-pay

## 2017-10-05 DIAGNOSIS — Z3A23 23 weeks gestation of pregnancy: Secondary | ICD-10-CM | POA: Insufficient documentation

## 2017-10-05 DIAGNOSIS — O99212 Obesity complicating pregnancy, second trimester: Secondary | ICD-10-CM

## 2017-10-05 DIAGNOSIS — Z362 Encounter for other antenatal screening follow-up: Secondary | ICD-10-CM

## 2017-10-05 DIAGNOSIS — O99282 Endocrine, nutritional and metabolic diseases complicating pregnancy, second trimester: Secondary | ICD-10-CM | POA: Diagnosis not present

## 2017-10-05 DIAGNOSIS — E669 Obesity, unspecified: Secondary | ICD-10-CM | POA: Insufficient documentation

## 2017-10-05 DIAGNOSIS — Z8632 Personal history of gestational diabetes: Secondary | ICD-10-CM | POA: Diagnosis not present

## 2017-10-05 DIAGNOSIS — Z3689 Encounter for other specified antenatal screening: Secondary | ICD-10-CM | POA: Insufficient documentation

## 2017-10-05 DIAGNOSIS — O09292 Supervision of pregnancy with other poor reproductive or obstetric history, second trimester: Secondary | ICD-10-CM

## 2017-10-05 DIAGNOSIS — O361120 Maternal care for Anti-A sensitization, second trimester, not applicable or unspecified: Secondary | ICD-10-CM | POA: Insufficient documentation

## 2017-10-05 DIAGNOSIS — E039 Hypothyroidism, unspecified: Secondary | ICD-10-CM | POA: Diagnosis not present

## 2017-10-07 ENCOUNTER — Encounter: Payer: Self-pay | Admitting: Family Medicine

## 2017-10-07 MED ORDER — AMOXICILLIN-POT CLAVULANATE 875-125 MG PO TABS: 1.0000 | ORAL_TABLET | Freq: Two times a day (BID) | ORAL | 0 refills | Status: AC

## 2017-10-18 ENCOUNTER — Ambulatory Visit (INDEPENDENT_AMBULATORY_CARE_PROVIDER_SITE_OTHER): Payer: Medicaid Other | Admitting: Obstetrics & Gynecology

## 2017-10-18 VITALS — BP 117/53 | HR 85 | Wt 312.0 lb

## 2017-10-18 DIAGNOSIS — E039 Hypothyroidism, unspecified: Secondary | ICD-10-CM

## 2017-10-18 DIAGNOSIS — E282 Polycystic ovarian syndrome: Secondary | ICD-10-CM

## 2017-10-18 DIAGNOSIS — R768 Other specified abnormal immunological findings in serum: Secondary | ICD-10-CM

## 2017-10-18 DIAGNOSIS — O361920 Maternal care for other isoimmunization, second trimester, not applicable or unspecified: Secondary | ICD-10-CM

## 2017-10-18 DIAGNOSIS — O99282 Endocrine, nutritional and metabolic diseases complicating pregnancy, second trimester: Secondary | ICD-10-CM

## 2017-10-18 DIAGNOSIS — Z8632 Personal history of gestational diabetes: Secondary | ICD-10-CM

## 2017-10-18 DIAGNOSIS — O99210 Obesity complicating pregnancy, unspecified trimester: Secondary | ICD-10-CM

## 2017-10-18 DIAGNOSIS — O361190 Maternal care for Anti-A sensitization, unspecified trimester, not applicable or unspecified: Secondary | ICD-10-CM

## 2017-10-18 DIAGNOSIS — O0990 Supervision of high risk pregnancy, unspecified, unspecified trimester: Secondary | ICD-10-CM

## 2017-10-18 NOTE — Patient Instructions (Signed)

## 2017-10-18 NOTE — Progress Notes (Signed)
   PRENATAL VISIT NOTE  Subjective:  Joan Becker is a 32 y.o. G2P1001 at 4570w3d being seen today for ongoing prenatal care.  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  Patient reports no complaints.  Contractions: Not present. Vag. Bleeding: None.  Movement: Present. Denies leaking of fluid.   The following portions of the patient's history were reviewed and updated as appropriate: allergies, current medications, past family history, past medical history, past social history, past surgical history and problem list. Problem list updated.  Objective:   Vitals:   10/18/17 0815  BP: (!) 117/53  Pulse: 85  Weight: (!) 312 lb (141.5 kg)    Fetal Status: Fetal Heart Rate (bpm): 123   Movement: Present     General:  Alert, oriented and cooperative. Patient is in no acute distress.  Skin: Skin is warm and dry. No rash noted.   Cardiovascular: Normal heart rate noted  Respiratory: Normal respiratory effort, no problems with respiration noted  Abdomen: Soft, gravid, appropriate for gestational age.  Pain/Pressure: Present     Pelvic: Cervical exam deferred        Extremities: Normal range of motion.  Edema: Trace  Mental Status: Normal mood and affect. Normal behavior. Normal judgment and thought content.   Assessment and Plan:  Pregnancy: G2P1001 at 2470w3d  1. Supervision of high risk pregnancy, antepartum  2. PCOS (polycystic ovarian syndrome)  3. Obesity in pregnancy, antepartum S>D  Has US for growth at [facility]      4. Maternal atypical antibody affecting pregnancy in second  trimester, single or unspecified fetus Has appt with MF tomorrow fo f/u and MCA dopplers  5. Isoimmunization, antepartum, single or unspecified fetus See above  6. Hypothyroidism affecting pregnancy in second trimester   7. History of gestational diabetes mellitus (GDM) 2 hour GTT WNL Pt has a glc monitor at home and will spot check her glc at home   8. Anti-Duffy antibodies present See above   Preterm labor symptoms and general obstetric precautions including but not limited to vaginal bleeding, contractions, leaking of fluid and fetal movement were reviewed in detail with the patient. Please refer to After Visit Summary for other counseling recommendations.  Return in about 2 weeks (around 11/01/2017).  Future Appointments  Date Time Provider Department Center  10/19/2017  9:30 AM [facility] US 1 [facility]  11/02/2017  9:30 AM [facility] US 1 [facility]    Willodean Rosenthalarolyn Harraway-Smith, MD

## 2017-10-19 ENCOUNTER — Other Ambulatory Visit (HOSPITAL_COMMUNITY): Payer: Self-pay | Admitting: Obstetrics and Gynecology

## 2017-10-19 ENCOUNTER — Ambulatory Visit (HOSPITAL_COMMUNITY)
Admission: RE | Admit: 2017-10-19 | Discharge: 2017-10-19 | Disposition: A | Discharge status: Home / Self Care | Payer: Medicaid Other | Source: Ambulatory Visit | Attending: Family Medicine | Admitting: Family Medicine

## 2017-10-19 ENCOUNTER — Encounter (HOSPITAL_COMMUNITY): Payer: Self-pay

## 2017-10-19 DIAGNOSIS — O361120 Maternal care for Anti-A sensitization, second trimester, not applicable or unspecified: Secondary | ICD-10-CM

## 2017-10-19 DIAGNOSIS — Z8632 Personal history of gestational diabetes: Secondary | ICD-10-CM

## 2017-10-19 DIAGNOSIS — O360120 Maternal care for anti-D [Rh] antibodies, second trimester, not applicable or unspecified: Secondary | ICD-10-CM | POA: Insufficient documentation

## 2017-10-19 DIAGNOSIS — Z362 Encounter for other antenatal screening follow-up: Secondary | ICD-10-CM

## 2017-10-19 DIAGNOSIS — O09292 Supervision of pregnancy with other poor reproductive or obstetric history, second trimester: Secondary | ICD-10-CM

## 2017-10-19 DIAGNOSIS — O99212 Obesity complicating pregnancy, second trimester: Secondary | ICD-10-CM

## 2017-10-19 DIAGNOSIS — Z3A25 25 weeks gestation of pregnancy: Secondary | ICD-10-CM | POA: Insufficient documentation

## 2017-10-19 DIAGNOSIS — O99282 Endocrine, nutritional and metabolic diseases complicating pregnancy, second trimester: Secondary | ICD-10-CM

## 2017-10-19 DIAGNOSIS — E039 Hypothyroidism, unspecified: Secondary | ICD-10-CM

## 2017-10-26 ENCOUNTER — Inpatient Hospital Stay (HOSPITAL_COMMUNITY): Payer: No Typology Code available for payment source | Admitting: Anesthesiology

## 2017-10-26 ENCOUNTER — Inpatient Hospital Stay (HOSPITAL_COMMUNITY): Payer: No Typology Code available for payment source

## 2017-10-26 ENCOUNTER — Inpatient Hospital Stay (HOSPITAL_COMMUNITY)
Admission: EM | Admit: 2017-10-26 | Discharge: 2017-11-01 | DRG: 493 | Disposition: A | Discharge status: Home / Self Care | Payer: No Typology Code available for payment source | Attending: General Surgery | Admitting: General Surgery

## 2017-10-26 ENCOUNTER — Encounter: Payer: Self-pay | Admitting: *Deleted

## 2017-10-26 ENCOUNTER — Emergency Department (HOSPITAL_COMMUNITY): Payer: No Typology Code available for payment source

## 2017-10-26 ENCOUNTER — Encounter (HOSPITAL_COMMUNITY): Admission: EM | Disposition: A | Discharge status: Home / Self Care | Payer: Self-pay | Source: Home / Self Care

## 2017-10-26 ENCOUNTER — Other Ambulatory Visit (HOSPITAL_COMMUNITY): Payer: Medicaid Other

## 2017-10-26 ENCOUNTER — Encounter (HOSPITAL_COMMUNITY): Payer: Self-pay | Admitting: Emergency Medicine

## 2017-10-26 DIAGNOSIS — Z3A27 27 weeks gestation of pregnancy: Secondary | ICD-10-CM | POA: Diagnosis not present

## 2017-10-26 DIAGNOSIS — O99283 Endocrine, nutritional and metabolic diseases complicating pregnancy, third trimester: Secondary | ICD-10-CM | POA: Diagnosis present

## 2017-10-26 DIAGNOSIS — Z419 Encounter for procedure for purposes other than remedying health state, unspecified: Secondary | ICD-10-CM

## 2017-10-26 DIAGNOSIS — O360120 Maternal care for anti-D [Rh] antibodies, second trimester, not applicable or unspecified: Secondary | ICD-10-CM

## 2017-10-26 DIAGNOSIS — S82831A Other fracture of upper and lower end of right fibula, initial encounter for closed fracture: Secondary | ICD-10-CM | POA: Diagnosis present

## 2017-10-26 DIAGNOSIS — O99280 Endocrine, nutritional and metabolic diseases complicating pregnancy, unspecified trimester: Secondary | ICD-10-CM

## 2017-10-26 DIAGNOSIS — Z79890 Hormone replacement therapy (postmenopausal): Secondary | ICD-10-CM | POA: Diagnosis not present

## 2017-10-26 DIAGNOSIS — M25061 Hemarthrosis, right knee: Secondary | ICD-10-CM | POA: Diagnosis present

## 2017-10-26 DIAGNOSIS — O99212 Obesity complicating pregnancy, second trimester: Secondary | ICD-10-CM

## 2017-10-26 DIAGNOSIS — S9301XA Subluxation of right ankle joint, initial encounter: Secondary | ICD-10-CM | POA: Diagnosis present

## 2017-10-26 DIAGNOSIS — Y9210 Unspecified residential institution as the place of occurrence of the external cause: Secondary | ICD-10-CM

## 2017-10-26 DIAGNOSIS — O9A212 Injury, poisoning and certain other consequences of external causes complicating pregnancy, second trimester: Secondary | ICD-10-CM | POA: Diagnosis present

## 2017-10-26 DIAGNOSIS — S8261XA Displaced fracture of lateral malleolus of right fibula, initial encounter for closed fracture: Secondary | ICD-10-CM | POA: Diagnosis not present

## 2017-10-26 DIAGNOSIS — R040 Epistaxis: Secondary | ICD-10-CM | POA: Diagnosis present

## 2017-10-26 DIAGNOSIS — S52132A Displaced fracture of neck of left radius, initial encounter for closed fracture: Secondary | ICD-10-CM | POA: Diagnosis present

## 2017-10-26 DIAGNOSIS — M25561 Pain in right knee: Secondary | ICD-10-CM | POA: Diagnosis present

## 2017-10-26 DIAGNOSIS — E039 Hypothyroidism, unspecified: Secondary | ICD-10-CM | POA: Diagnosis present

## 2017-10-26 DIAGNOSIS — S82141A Displaced bicondylar fracture of right tibia, initial encounter for closed fracture: Secondary | ICD-10-CM | POA: Diagnosis present

## 2017-10-26 DIAGNOSIS — R52 Pain, unspecified: Secondary | ICD-10-CM

## 2017-10-26 HISTORY — PX: ORIF TIBIA PLATEAU: SHX2132

## 2017-10-26 LAB — URINALYSIS, ROUTINE W REFLEX MICROSCOPIC
BILIRUBIN URINE: NEGATIVE
Bacteria, UA: NONE SEEN
GLUCOSE, UA: NEGATIVE mg/dL
KETONES UR: 5 mg/dL — AB
LEUKOCYTES UA: NEGATIVE
Nitrite: NEGATIVE
PH: 6 (ref 5.0–8.0)
Protein, ur: NEGATIVE mg/dL
SPECIFIC GRAVITY, URINE: 1.011 (ref 1.005–1.030)

## 2017-10-26 LAB — COMPREHENSIVE METABOLIC PANEL
ALK PHOS: 49 U/L (ref 38–126)
ALT: 11 U/L — ABNORMAL LOW (ref 14–54)
ANION GAP: 7 (ref 5–15)
AST: 14 U/L — ABNORMAL LOW (ref 15–41)
Albumin: 2.6 g/dL — ABNORMAL LOW (ref 3.5–5.0)
BILIRUBIN TOTAL: 0.5 mg/dL (ref 0.3–1.2)
BUN: 6 mg/dL (ref 6–20)
CALCIUM: 8.5 mg/dL — AB (ref 8.9–10.3)
CO2: 19 mmol/L — ABNORMAL LOW (ref 22–32)
Chloride: 110 mmol/L (ref 101–111)
Creatinine, Ser: 0.58 mg/dL (ref 0.44–1.00)
Glucose, Bld: 125 mg/dL — ABNORMAL HIGH (ref 65–99)
Potassium: 3.9 mmol/L (ref 3.5–5.1)
SODIUM: 136 mmol/L (ref 135–145)
TOTAL PROTEIN: 6.2 g/dL — AB (ref 6.5–8.1)

## 2017-10-26 LAB — I-STAT CHEM 8, ED
BUN: 5 mg/dL — ABNORMAL LOW (ref 6–20)
CALCIUM ION: 1.11 mmol/L — AB (ref 1.15–1.40)
CHLORIDE: 107 mmol/L (ref 101–111)
CREATININE: 0.4 mg/dL — AB (ref 0.44–1.00)
GLUCOSE: 122 mg/dL — AB (ref 65–99)
HCT: 30 % — ABNORMAL LOW (ref 36.0–46.0)
Hemoglobin: 10.2 g/dL — ABNORMAL LOW (ref 12.0–15.0)
Potassium: 3.8 mmol/L (ref 3.5–5.1)
Sodium: 136 mmol/L (ref 135–145)
TCO2: 19 mmol/L — ABNORMAL LOW (ref 22–32)

## 2017-10-26 LAB — CBC
HCT: 33.5 % — ABNORMAL LOW (ref 36.0–46.0)
HEMOGLOBIN: 9.8 g/dL — AB (ref 12.0–15.0)
MCH: 24.1 pg — ABNORMAL LOW (ref 26.0–34.0)
MCHC: 29.3 g/dL — ABNORMAL LOW (ref 30.0–36.0)
MCV: 82.3 fL (ref 78.0–100.0)
Platelets: 165 10*3/uL (ref 150–400)
RBC: 4.07 MIL/uL (ref 3.87–5.11)
RDW: 17.5 % — ABNORMAL HIGH (ref 11.5–15.5)
WBC: 6.3 10*3/uL (ref 4.0–10.5)

## 2017-10-26 LAB — ETHANOL

## 2017-10-26 LAB — PROTIME-INR
INR: 1.04
Prothrombin Time: 13.5 seconds (ref 11.4–15.2)

## 2017-10-26 LAB — SAMPLE TO BLOOD BANK

## 2017-10-26 LAB — I-STAT CG4 LACTIC ACID, ED: Lactic Acid, Venous: 1.82 mmol/L (ref 0.5–1.9)

## 2017-10-26 SURGERY — OPEN REDUCTION INTERNAL FIXATION (ORIF) TIBIAL PLATEAU
Anesthesia: General | Laterality: Right

## 2017-10-26 MED ORDER — HYDRALAZINE HCL 20 MG/ML IJ SOLN
5.0000 mg | Freq: Once | INTRAMUSCULAR | Status: AC
  Administered 2017-10-27: 5 mg via INTRAVENOUS
  Filled 2017-10-26: qty 0.25

## 2017-10-26 MED ORDER — LACTATED RINGERS IV SOLN
INTRAVENOUS | Status: AC | PRN
  Administered 2017-10-26: 125 mL/h via INTRAVENOUS

## 2017-10-26 MED ORDER — ACETAMINOPHEN 325 MG PO TABS
650.0000 mg | ORAL_TABLET | ORAL | Status: DC
  Administered 2017-10-27 – 2017-11-01 (×30): 650 mg via ORAL
  Filled 2017-10-26 (×31): qty 2

## 2017-10-26 MED ORDER — MORPHINE SULFATE 2 MG/ML IJ SOLN
INTRAMUSCULAR | Status: AC | PRN
  Administered 2017-10-26: 4 mg via INTRAVENOUS

## 2017-10-26 MED ORDER — LACTATED RINGERS IV SOLN
INTRAVENOUS | Status: DC
  Administered 2017-10-26 – 2017-10-27 (×2): via INTRAVENOUS

## 2017-10-26 MED ORDER — PROPOFOL 10 MG/ML IV BOLUS
INTRAVENOUS | Status: DC | PRN
  Administered 2017-10-26: 200 mg via INTRAVENOUS

## 2017-10-26 MED ORDER — DOCUSATE SODIUM 100 MG PO CAPS
100.0000 mg | ORAL_CAPSULE | Freq: Two times a day (BID) | ORAL | Status: DC
  Administered 2017-10-27 – 2017-10-31 (×10): 100 mg via ORAL
  Filled 2017-10-26 (×12): qty 1

## 2017-10-26 MED ORDER — MIDAZOLAM HCL 2 MG/2ML IJ SOLN
INTRAMUSCULAR | Status: AC
  Filled 2017-10-26: qty 2

## 2017-10-26 MED ORDER — LEVOTHYROXINE SODIUM 25 MCG PO TABS
325.0000 ug | ORAL_TABLET | Freq: Every day | ORAL | Status: DC
  Administered 2017-10-31 – 2017-11-01 (×2): 325 ug via ORAL
  Filled 2017-10-26 (×8): qty 1

## 2017-10-26 MED ORDER — HYDRALAZINE HCL 20 MG/ML IJ SOLN
10.0000 mg | Freq: Once | INTRAMUSCULAR | Status: DC | PRN
  Filled 2017-10-26: qty 0.5

## 2017-10-26 MED ORDER — LABETALOL HCL 5 MG/ML IV SOLN
20.0000 mg | INTRAVENOUS | Status: DC | PRN
  Filled 2017-10-26: qty 16

## 2017-10-26 MED ORDER — FENTANYL CITRATE (PF) 100 MCG/2ML IJ SOLN: 25.0000 ug | Freq: Once | INTRAMUSCULAR | Status: DC

## 2017-10-26 MED ORDER — FENTANYL CITRATE (PF) 250 MCG/5ML IJ SOLN
INTRAMUSCULAR | Status: AC
  Filled 2017-10-26: qty 5

## 2017-10-26 MED ORDER — MORPHINE SULFATE (PF) 4 MG/ML IV SOLN
INTRAVENOUS | Status: AC
  Filled 2017-10-26: qty 1

## 2017-10-26 MED ORDER — HYDROMORPHONE HCL 2 MG/ML IJ SOLN
INTRAMUSCULAR | Status: AC
  Filled 2017-10-26: qty 1

## 2017-10-26 MED ORDER — BUPIVACAINE HCL (PF) 0.25 % IJ SOLN
INTRAMUSCULAR | Status: DC | PRN
  Administered 2017-10-26: 20 mL

## 2017-10-26 MED ORDER — MORPHINE SULFATE (PF) 4 MG/ML IV SOLN
INTRAVENOUS | Status: AC
  Administered 2017-10-27: 4 mg via INTRAVENOUS
  Filled 2017-10-26: qty 1

## 2017-10-26 MED ORDER — CEFAZOLIN SODIUM-DEXTROSE 2-4 GM/100ML-% IV SOLN
INTRAVENOUS | Status: AC
  Filled 2017-10-26: qty 100

## 2017-10-26 MED ORDER — SUCCINYLCHOLINE CHLORIDE 20 MG/ML IJ SOLN
INTRAMUSCULAR | Status: DC | PRN
  Administered 2017-10-26: 100 mg via INTRAVENOUS

## 2017-10-26 MED ORDER — MORPHINE SULFATE (PF) 4 MG/ML IV SOLN: 2.0000 mg | INTRAVENOUS | Status: DC | PRN

## 2017-10-26 MED ORDER — HYDROMORPHONE HCL 2 MG/ML IJ SOLN
0.2500 mg | INTRAMUSCULAR | Status: DC | PRN
  Administered 2017-10-26: 0.5 mg via INTRAVENOUS

## 2017-10-26 MED ORDER — HYDRALAZINE HCL 20 MG/ML IJ SOLN
INTRAMUSCULAR | Status: AC
  Filled 2017-10-26: qty 1

## 2017-10-26 MED ORDER — PROPOFOL 10 MG/ML IV BOLUS
INTRAVENOUS | Status: AC
  Filled 2017-10-26: qty 20

## 2017-10-26 MED ORDER — SUGAMMADEX SODIUM 200 MG/2ML IV SOLN
INTRAVENOUS | Status: DC | PRN
  Administered 2017-10-26: 281.2 mg via INTRAVENOUS

## 2017-10-26 MED ORDER — PHENYLEPHRINE HCL 10 MG/ML IJ SOLN
INTRAMUSCULAR | Status: DC | PRN
  Administered 2017-10-26: 100 ug via INTRAVENOUS

## 2017-10-26 MED ORDER — FENTANYL CITRATE (PF) 100 MCG/2ML IJ SOLN
INTRAMUSCULAR | Status: DC | PRN
  Administered 2017-10-26: 50 ug via INTRAVENOUS
  Administered 2017-10-26: 100 ug via INTRAVENOUS
  Administered 2017-10-26: 50 ug via INTRAVENOUS
  Administered 2017-10-26 (×2): 25 ug via INTRAVENOUS
  Administered 2017-10-26: 50 ug via INTRAVENOUS
  Administered 2017-10-26: 25 ug via INTRAVENOUS
  Administered 2017-10-26 (×2): 50 ug via INTRAVENOUS
  Administered 2017-10-26: 25 ug via INTRAVENOUS
  Administered 2017-10-26: 50 ug via INTRAVENOUS

## 2017-10-26 MED ORDER — MORPHINE SULFATE 2 MG/ML IJ SOLN
INTRAMUSCULAR | Status: DC | PRN
  Administered 2017-10-26: 4 mg via INTRAVENOUS

## 2017-10-26 MED ORDER — PROMETHAZINE HCL 25 MG/ML IJ SOLN
INTRAMUSCULAR | Status: AC
  Filled 2017-10-26: qty 1

## 2017-10-26 MED ORDER — PROMETHAZINE HCL 25 MG/ML IJ SOLN
6.2500 mg | INTRAMUSCULAR | Status: DC | PRN
  Administered 2017-10-26: 12.5 mg via INTRAVENOUS

## 2017-10-26 MED ORDER — 0.9 % SODIUM CHLORIDE (POUR BTL) OPTIME
TOPICAL | Status: DC | PRN
  Administered 2017-10-26: 1000 mL

## 2017-10-26 MED ORDER — CEFAZOLIN SODIUM-DEXTROSE 2-3 GM-%(50ML) IV SOLR
INTRAVENOUS | Status: DC | PRN
  Administered 2017-10-26: 2 g via INTRAVENOUS

## 2017-10-26 MED ORDER — POLYETHYLENE GLYCOL 3350 17 G PO PACK
17.0000 g | PACK | Freq: Every day | ORAL | Status: DC
  Filled 2017-10-26 (×6): qty 1

## 2017-10-26 MED ORDER — LIDOCAINE HCL (CARDIAC) PF 100 MG/5ML IV SOSY
PREFILLED_SYRINGE | INTRAVENOUS | Status: DC | PRN
  Administered 2017-10-26: 100 mg via INTRAVENOUS

## 2017-10-26 MED ORDER — ONDANSETRON HCL 4 MG/2ML IJ SOLN
INTRAMUSCULAR | Status: DC | PRN
  Administered 2017-10-26: 4 mg via INTRAVENOUS

## 2017-10-26 MED ORDER — LACTATED RINGERS IV SOLN
INTRAVENOUS | Status: DC
  Administered 2017-10-26 – 2017-10-31 (×8): via INTRAVENOUS

## 2017-10-26 MED ORDER — OXYCODONE HCL 5 MG PO TABS
5.0000 mg | ORAL_TABLET | ORAL | Status: DC | PRN
  Administered 2017-10-26 – 2017-10-28 (×5): 5 mg via ORAL
  Filled 2017-10-26 (×5): qty 1

## 2017-10-26 MED ORDER — ROCURONIUM BROMIDE 100 MG/10ML IV SOLN
INTRAVENOUS | Status: DC | PRN
  Administered 2017-10-26: 50 mg via INTRAVENOUS
  Administered 2017-10-26: 10 mg via INTRAVENOUS

## 2017-10-26 MED ORDER — BUPIVACAINE HCL (PF) 0.25 % IJ SOLN
INTRAMUSCULAR | Status: AC
  Filled 2017-10-26: qty 30

## 2017-10-26 MED ORDER — FENTANYL CITRATE (PF) 100 MCG/2ML IJ SOLN
INTRAMUSCULAR | Status: AC | PRN
  Administered 2017-10-26: 25 ug via INTRAVENOUS

## 2017-10-26 MED ORDER — MORPHINE SULFATE (PF) 4 MG/ML IV SOLN
4.0000 mg | INTRAVENOUS | Status: DC | PRN
  Administered 2017-10-27 – 2017-10-28 (×10): 4 mg via INTRAVENOUS
  Filled 2017-10-26 (×10): qty 1

## 2017-10-26 SURGICAL SUPPLY — 96 items
BANDAGE ACE 4X5 VEL STRL LF (GAUZE/BANDAGES/DRESSINGS) IMPLANT
BANDAGE ACE 6X5 VEL STRL LF (GAUZE/BANDAGES/DRESSINGS) ×2 IMPLANT
BANDAGE ESMARK 6X9 LF (GAUZE/BANDAGES/DRESSINGS) IMPLANT
BIT DRILL 100X2.5XANTM LCK (BIT) IMPLANT
BIT DRILL 3.8 CANN DISP (BIT) ×2 IMPLANT
BIT DRILL CAL (BIT) IMPLANT
BIT DRILL SLEEVE MEASURING (BIT) IMPLANT
BIT DRL 100X2.5XANTM LCK (BIT) ×1
BLADE CLIPPER SURG (BLADE) IMPLANT
BLADE SURG 10 STRL SS (BLADE) ×3 IMPLANT
BNDG CMPR 9X6 STRL LF SNTH (GAUZE/BANDAGES/DRESSINGS) ×1
BNDG COHESIVE 4X5 TAN STRL (GAUZE/BANDAGES/DRESSINGS) ×2 IMPLANT
BNDG ESMARK 6X9 LF (GAUZE/BANDAGES/DRESSINGS) ×3
BNDG GAUZE ELAST 4 BULKY (GAUZE/BANDAGES/DRESSINGS) IMPLANT
BONE CANC CHIPS 20CC PCAN1/4 (Bone Implant) ×3 IMPLANT
CHIPS CANC BONE 20CC PCAN1/4 (Bone Implant) ×1 IMPLANT
CLEANER TIP ELECTROSURG 2X2 (MISCELLANEOUS) ×3 IMPLANT
COVER MAYO STAND STRL (DRAPES) ×3 IMPLANT
COVER SURGICAL LIGHT HANDLE (MISCELLANEOUS) ×3 IMPLANT
CUFF TOURNIQUET SINGLE 34IN LL (TOURNIQUET CUFF) IMPLANT
CUFF TOURNIQUET SINGLE 44IN (TOURNIQUET CUFF) ×2 IMPLANT
DRAPE C-ARM 42X72 X-RAY (DRAPES) ×2 IMPLANT
DRAPE C-ARMOR (DRAPES) ×2 IMPLANT
DRAPE INCISE IOBAN 66X45 STRL (DRAPES) ×3 IMPLANT
DRAPE U-SHAPE 47X51 STRL (DRAPES) ×3 IMPLANT
DRILL BIT 2.5MM (BIT) ×3
DRILL BIT CAL (BIT) ×3
DRILL SLEEVE MEASURING (BIT) ×3
DRSG ADAPTIC 3X8 NADH LF (GAUZE/BANDAGES/DRESSINGS) IMPLANT
DRSG PAD ABDOMINAL 8X10 ST (GAUZE/BANDAGES/DRESSINGS) ×4 IMPLANT
DURAPREP 26ML APPLICATOR (WOUND CARE) ×5 IMPLANT
ELECT REM PT RETURN 9FT ADLT (ELECTROSURGICAL) ×3
ELECTRODE REM PT RTRN 9FT ADLT (ELECTROSURGICAL) ×1 IMPLANT
EVACUATOR 1/8 PVC DRAIN (DRAIN) IMPLANT
GAUZE SPONGE 4X4 12PLY STRL (GAUZE/BANDAGES/DRESSINGS) ×2 IMPLANT
GAUZE XEROFORM 5X9 LF (GAUZE/BANDAGES/DRESSINGS) ×2 IMPLANT
GLOVE BIOGEL PI IND STRL 7.5 (GLOVE) ×1 IMPLANT
GLOVE BIOGEL PI IND STRL 8 (GLOVE) ×1 IMPLANT
GLOVE BIOGEL PI INDICATOR 7.5 (GLOVE)
GLOVE BIOGEL PI INDICATOR 8 (GLOVE) ×2
GLOVE ECLIPSE 7.0 STRL STRAW (GLOVE) ×1 IMPLANT
GLOVE ORTHO TXT STRL SZ7.5 (GLOVE) ×3 IMPLANT
GOWN STRL REUS W/ TWL LRG LVL3 (GOWN DISPOSABLE) ×2 IMPLANT
GOWN STRL REUS W/ TWL XL LVL3 (GOWN DISPOSABLE) ×1 IMPLANT
GOWN STRL REUS W/TWL LRG LVL3 (GOWN DISPOSABLE) ×3
GOWN STRL REUS W/TWL XL LVL3 (GOWN DISPOSABLE) ×3
GRAFT BNE CANC CHIPS 1-8 20CC (Bone Implant) IMPLANT
IMMOBILIZER KNEE 24 THIGH 36 (MISCELLANEOUS) IMPLANT
IMMOBILIZER KNEE 24 UNIV (MISCELLANEOUS) ×3 IMPLANT
K-WIRE ACE 1.6X6 (WIRE) ×3
KIT BASIN OR (CUSTOM PROCEDURE TRAY) ×3 IMPLANT
KIT TURNOVER KIT B (KITS) ×3 IMPLANT
KWIRE ACE 1.6X6 (WIRE) IMPLANT
MANIFOLD NEPTUNE II (INSTRUMENTS) ×3 IMPLANT
NDL HYPO 25X1 1.5 SAFETY (NEEDLE) ×1 IMPLANT
NEEDLE HYPO 25X1 1.5 SAFETY (NEEDLE) ×3 IMPLANT
NS IRRIG 1000ML POUR BTL (IV SOLUTION) ×3 IMPLANT
PACK ORTHO EXTREMITY (CUSTOM PROCEDURE TRAY) ×3 IMPLANT
PAD ARMBOARD 7.5X6 YLW CONV (MISCELLANEOUS) ×4 IMPLANT
PAD CAST 4YDX4 CTTN HI CHSV (CAST SUPPLIES) IMPLANT
PADDING CAST COTTON 4X4 STRL (CAST SUPPLIES)
PADDING CAST COTTON 6X4 STRL (CAST SUPPLIES) ×2 IMPLANT
PLATE LOCK 5H STD RT PROX TIB (Plate) ×2 IMPLANT
SCREW CORTICAL 3.5MM  30MM (Screw) ×2 IMPLANT
SCREW CORTICAL 3.5MM 30MM (Screw) IMPLANT
SCREW CORTICAL 3.5MM 38MM (Screw) ×6 IMPLANT
SCREW LAG CORT CANN 4.5 20X55 (Screw) ×2 IMPLANT
SCREW LOCK CORT STAR 3.5X48 (Screw) ×2 IMPLANT
SCREW LOCK CORT STAR 3.5X52 (Screw) ×2 IMPLANT
SCREW LOCK CORT STAR 3.5X54 (Screw) ×2 IMPLANT
SCREW LOCK CORT STAR 3.5X65 (Screw) ×2 IMPLANT
SCREW LOCK CORT STAR 3.5X70 (Screw) ×4 IMPLANT
SCREW LP 3.5X65MM (Screw) ×2 IMPLANT
SCREW LP 3.5X70MM (Screw) ×2 IMPLANT
SCREW LP 3.5X75MM (Screw) ×2 IMPLANT
SPLINT PLASTER CAST XFAST 5X30 (CAST SUPPLIES) IMPLANT
SPLINT PLASTER XFAST SET 5X30 (CAST SUPPLIES) ×2
SPONGE LAP 18X18 X RAY DECT (DISPOSABLE) ×3 IMPLANT
STAPLER VISISTAT 35W (STAPLE) ×2 IMPLANT
STOCKINETTE IMPERVIOUS LG (DRAPES) ×3 IMPLANT
SUCTION FRAZIER HANDLE 10FR (MISCELLANEOUS) ×2
SUCTION TUBE FRAZIER 10FR DISP (MISCELLANEOUS) ×1 IMPLANT
SUT ETHIBOND 2 0 V5 (SUTURE) ×1 IMPLANT
SUT VIC AB 0 CT1 27 (SUTURE)
SUT VIC AB 0 CT1 27XBRD ANBCTR (SUTURE) ×1 IMPLANT
SUT VIC AB 1 CT1 27 (SUTURE) ×3
SUT VIC AB 1 CT1 27XBRD ANBCTR (SUTURE) ×1 IMPLANT
SUT VIC AB 2-0 CT1 27 (SUTURE) ×6
SUT VIC AB 2-0 CT1 TAPERPNT 27 (SUTURE) IMPLANT
SYR CONTROL 10ML LL (SYRINGE) ×3 IMPLANT
TOWEL OR 17X24 6PK STRL BLUE (TOWEL DISPOSABLE) ×1 IMPLANT
TOWEL OR 17X26 10 PK STRL BLUE (TOWEL DISPOSABLE) ×3 IMPLANT
TUBE CONNECTING 12'X1/4 (SUCTIONS) ×1
TUBE CONNECTING 12X1/4 (SUCTIONS) ×2 IMPLANT
WATER STERILE IRR 1000ML POUR (IV SOLUTION) ×2 IMPLANT
YANKAUER SUCT BULB TIP NO VENT (SUCTIONS) ×3 IMPLANT

## 2017-10-26 NOTE — ED Notes (Signed)
OB RN at bedside

## 2017-10-26 NOTE — Progress Notes (Addendum)
Patient Name: Joan LamasSheena G Becker, female   DOB: 18-Oct-1985, 32 y.o.  MRN: [id number]  Discussed pt with Dr Ophelia CharterYates. No contractions on monitor, no evidence of abruption on US and no symptoms suggestive of abruption. Ok to proceed with surgery from an obstetrical stand point.   If pharmaceutical DVT prophylaxis is desired, lovenox would be the preferred medication as coumadin is contraindicated and Xa inhibitors have extremely limited data on safety during pregnancy.   Would recommend avoiding NSAIDs. Oral and IV narcotics would be preferred.  Would recommend NST prior to surgery and following.  Levie HeritageJacob J Stinson, DO 10/26/2017 1:31 PM
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
## 2017-10-26 NOTE — Transfer of Care (Signed)
Immediate Anesthesia Transfer of Care Note  Patient: Joan Becker  Procedure(s) Performed: OPEN REDUCTION INTERNAL FIXATION (ORIF) TIBIAL PLATEAU, MEDIAL AND LATERAL CONDYLE FIXATION, RIGHT ANKLE SPLINT APPLICATION (Right )  Patient Location: PACU  Anesthesia Type:General  Level of Consciousness: awake, alert , oriented and patient cooperative  Airway & Oxygen Therapy: Patient Spontanous Breathing and Patient connected to nasal cannula oxygen  Post-op Assessment: Report given to RN and Post -op Vital signs reviewed and stable  Post vital signs: Reviewed and stable  Last Vitals:  Vitals Value Taken Time  BP    Temp    Pulse 108 10/26/2017 10:55 PM  Resp    SpO2 98 % 10/26/2017 10:55 PM  Vitals shown include unvalidated device data.  Last Pain:  Vitals:   10/26/17 1833  TempSrc:   PainSc: 10-Worst pain ever         Complications: No apparent anesthesia complications

## 2017-10-26 NOTE — Progress Notes (Signed)
Dr Adrian BlackwaterStinson notified of patient's increased BPs at this time; Dr Adrian BlackwaterStinson and Dr Krista BlueSinger in agreement to give patient 5mg  Hydralazine IV once and order hypertension protocol and use if needed upon admission.

## 2017-10-26 NOTE — Anesthesia Procedure Notes (Signed)
Procedure Name: Intubation Date/Time: 10/26/2017 8:57 PM Performed by: Wilder GladeWinn, Jamarea Selner G, CRNA Pre-anesthesia Checklist: Patient identified, Emergency Drugs available, Suction available, Patient being monitored and Timeout performed Patient Re-evaluated:Patient Re-evaluated prior to induction Oxygen Delivery Method: Circle system utilized Preoxygenation: Pre-oxygenation with 100% oxygen Induction Type: IV induction Laryngoscope Size: Miller and 2 Grade View: Grade I Tube type: Oral Tube size: 7.0 mm Number of attempts: 1 Airway Equipment and Method: Stylet Placement Confirmation: ETT inserted through vocal cords under direct vision,  positive ETCO2 and breath sounds checked- equal and bilateral Secured at: 23 cm Tube secured with: Tape Dental Injury: Teeth and Oropharynx as per pre-operative assessment
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
## 2017-10-26 NOTE — Progress Notes (Signed)
Dr Adrian BlackwaterStinson update on patient who is still in OR at this time; Updated on NST PreOp; orders given to do NST PostOp and no further monitoring needed after that for inpatient stay unless OB related issue arise.

## 2017-10-26 NOTE — Interval H&P Note (Signed)
History and Physical Interval Note:  10/26/2017 8:22 PM  Joan Becker  has presented today for surgery, with the diagnosis of right tibia plateau fracture  The various methods of treatment have been discussed with the patient and family. After consideration of risks, benefits and other options for treatment, the patient has consented to  Procedure(s): OPEN REDUCTION INTERNAL FIXATION (ORIF) TIBIAL PLATEAU (Right) as a surgical intervention .  The patient's history has been reviewed, patient examined, no change in status, stable for surgery.  I have reviewed the patient's chart and labs.  Questions were answered to the patient's satisfaction.     Eldred MangesMark C Adolf Ormiston
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
## 2017-10-26 NOTE — ED Notes (Signed)
OB RRRN called and stated Pt will need to have Fetal tracing completed PreOR.
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
## 2017-10-26 NOTE — Progress Notes (Addendum)
MRI scan images of her knee reviewed.  MRI of the pelvis negative for acute fracture.  MRI of her knee shows involvement of the medial tibial plateau posteriorly.  There is comminution and depression of the lateral tibial plateau.  She will require additional screw fixation for the posterior aspect of the medial tibial plateau since it involves attachment of the PCL fibers.  No depression of the medial tibial plateau is noted.MRI findings reviewed with Dr. Drusilla Kannerhomas Dalessio Radiologist.

## 2017-10-26 NOTE — ED Notes (Signed)
Patient transported to MRI 

## 2017-10-26 NOTE — Anesthesia Preprocedure Evaluation (Addendum)
Anesthesia Evaluation  Patient identified by MRN, date of birth, ID band Patient awake    Reviewed: Allergy & Precautions, NPO status , Patient's Chart, lab work & pertinent test results  Airway Mallampati: II  TM Distance: >3 FB Neck ROM: Full    Dental no notable dental hx. (+) Dental Advisory Given   Pulmonary neg pulmonary ROS,    Pulmonary exam normal        Cardiovascular negative cardio ROS Normal cardiovascular exam     Neuro/Psych negative neurological ROS  negative psych ROS   GI/Hepatic negative GI ROS, Neg liver ROS,   Endo/Other  negative endocrine ROSHypothyroidism   Renal/GU negative Renal ROS  negative genitourinary   Musculoskeletal negative musculoskeletal ROS (+)   Abdominal   Peds negative pediatric ROS (+)  Hematology negative hematology ROS (+)   Anesthesia Other Findings   Reproductive/Obstetrics (+) Pregnancy Dr. Denyce RobertStinson's consult: No contractions on monitor, no evidence of abruption on US and no symptoms suggestive of abruption. Ok to proceed with surgery from an obstetrical stand point.   If pharmaceutical DVT prophylaxis is desired, lovenox would be the preferred medication as coumadin is contraindicated and Xa inhibitors have extremely limited data on safety during pregnancy.   Would recommend avoiding NSAIDs. Oral and IV narcotics would be preferred.  Would recommend NST prior to surgery and following.                              Anesthesia Physical Anesthesia Plan  ASA: III and emergent  Anesthesia Plan: General   Post-op Pain Management:    Induction: Intravenous, Rapid sequence and Cricoid pressure planned  PONV Risk Score and Plan: 2 and Ondansetron, Treatment may vary due to age or medical condition and Diphenhydramine  Airway Management Planned: Oral ETT  Additional Equipment:   Intra-op Plan:   Post-operative Plan: Extubation in  OR  Informed Consent: I have reviewed the patients History and Physical, chart, labs and discussed the procedure including the risks, benefits and alternatives for the proposed anesthesia with the patient or authorized representative who has indicated his/her understanding and acceptance.   Dental advisory given  Plan Discussed with: CRNA, Anesthesiologist and Surgeon  Anesthesia Plan Comments:        Anesthesia Quick Evaluation
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
## 2017-10-26 NOTE — Progress Notes (Signed)
RROB to patient's bedside who is being taken to OR for repair on right leg; EFM applied and assessing in ER and in PreOp; patient denies bleeding, LOF, or contractions at this time; patient reports positive fetal movement at this time; no contractions palpated during EFM; cardio constantly held by RROB for patient comfort; patient taken back to OR at this time and EFM will continue after patient is in PACU

## 2017-10-27 ENCOUNTER — Encounter (HOSPITAL_COMMUNITY): Payer: Self-pay

## 2017-10-27 LAB — BASIC METABOLIC PANEL
Anion gap: 8 (ref 5–15)
BUN: 6 mg/dL (ref 6–20)
CHLORIDE: 106 mmol/L (ref 101–111)
CO2: 20 mmol/L — ABNORMAL LOW (ref 22–32)
CREATININE: 0.53 mg/dL (ref 0.44–1.00)
Calcium: 8.4 mg/dL — ABNORMAL LOW (ref 8.9–10.3)
Glucose, Bld: 164 mg/dL — ABNORMAL HIGH (ref 65–99)
POTASSIUM: 3.9 mmol/L (ref 3.5–5.1)
SODIUM: 134 mmol/L — AB (ref 135–145)

## 2017-10-27 LAB — CBC
HCT: 32.2 % — ABNORMAL LOW (ref 36.0–46.0)
HEMOGLOBIN: 9.6 g/dL — AB (ref 12.0–15.0)
MCH: 24.2 pg — AB (ref 26.0–34.0)
MCHC: 29.8 g/dL — ABNORMAL LOW (ref 30.0–36.0)
MCV: 81.1 fL (ref 78.0–100.0)
PLATELETS: 173 10*3/uL (ref 150–400)
RBC: 3.97 MIL/uL (ref 3.87–5.11)
RDW: 17.6 % — ABNORMAL HIGH (ref 11.5–15.5)
WBC: 8.6 10*3/uL (ref 4.0–10.5)

## 2017-10-27 LAB — SURGICAL PCR SCREEN
MRSA, PCR: POSITIVE — AB
STAPHYLOCOCCUS AUREUS: POSITIVE — AB

## 2017-10-27 MED ORDER — CEFAZOLIN SODIUM-DEXTROSE 2-4 GM/100ML-% IV SOLN: 2.0000 g | INTRAVENOUS | Status: DC

## 2017-10-27 MED ORDER — CHLORHEXIDINE GLUCONATE CLOTH 2 % EX PADS
6.0000 | MEDICATED_PAD | Freq: Every day | CUTANEOUS | Status: DC
  Administered 2017-10-27 (×2): 6 via TOPICAL

## 2017-10-27 MED ORDER — CHLORHEXIDINE GLUCONATE 4 % EX LIQD: 60.0000 mL | Freq: Once | CUTANEOUS | Status: DC

## 2017-10-27 MED ORDER — POVIDONE-IODINE 10 % EX SWAB: 2.0000 "application " | Freq: Once | CUTANEOUS | Status: DC

## 2017-10-27 MED ORDER — MUPIROCIN 2 % EX OINT
1.0000 "application " | TOPICAL_OINTMENT | Freq: Two times a day (BID) | CUTANEOUS | Status: AC
  Administered 2017-10-27 – 2017-10-31 (×10): 1 via NASAL
  Filled 2017-10-27 (×3): qty 22

## 2017-10-27 MED ORDER — ENOXAPARIN SODIUM 30 MG/0.3ML ~~LOC~~ SOLN
30.0000 mg | Freq: Two times a day (BID) | SUBCUTANEOUS | Status: DC
  Administered 2017-10-27 – 2017-11-01 (×11): 30 mg via SUBCUTANEOUS
  Filled 2017-10-27 (×11): qty 0.3

## 2017-10-27 NOTE — Progress Notes (Signed)
Received call from [facility] about fetal heart monitoring.  Dr Alvester MorinNewton on unit and told of pt status.  She does not wish to do further fetal monitoring at this time unless an OB issue arises.  If pt is to go for additional surgery tomorrow, unit is to call rapid response nurse and at that time fetal monitoring will be done.  [facility] agrees that they will notify [facility] if this occurs.
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
## 2017-10-27 NOTE — Anesthesia Postprocedure Evaluation (Signed)
Anesthesia Post Note  Patient: Joan LamasSheena G Becker  Procedure(s) Performed: OPEN REDUCTION INTERNAL FIXATION (ORIF) TIBIAL PLATEAU, MEDIAL AND LATERAL CONDYLE FIXATION, RIGHT ANKLE SPLINT APPLICATION (Right )     Patient location during evaluation: PACU Anesthesia Type: General Level of consciousness: sedated Pain management: pain level controlled Vital Signs Assessment: post-procedure vital signs reviewed and stable Respiratory status: spontaneous breathing and respiratory function stable Cardiovascular status: stable Postop Assessment: no apparent nausea or vomiting Anesthetic complications: no    Last Vitals:  Vitals:   10/27/17 0000 10/27/17 0003  BP:  (!) 158/93  Pulse: 96   Resp: 20   Temp:    SpO2: 98%     Last Pain:  Vitals:   10/26/17 2356  TempSrc:   PainSc: Asleep                 Jakirah Zaun DANIEL

## 2017-10-27 NOTE — Progress Notes (Signed)
Pt expressed anxiety about having her baby monitored during hospital stay.  Note from obstetrics state fetal heart monitoring pre/post surgery. Pt possible surgery reported by night shift. Pt is not currently on the OR schedule for tomorrow. Lanora Manisalled [facility] Nurse @ [phone] and relayed patient concern. [facility]  Nurse stated they will not come and monitor baby unless patient goes to surgery.  Nurse informed patient.
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
## 2017-10-28 ENCOUNTER — Other Ambulatory Visit: Payer: Self-pay

## 2017-10-28 ENCOUNTER — Inpatient Hospital Stay (HOSPITAL_COMMUNITY): Payer: No Typology Code available for payment source

## 2017-10-28 LAB — HIV ANTIBODY (ROUTINE TESTING W REFLEX): HIV SCREEN 4TH GENERATION: NONREACTIVE

## 2017-10-28 LAB — CBC
HCT: 29.7 % — ABNORMAL LOW (ref 36.0–46.0)
Hemoglobin: 8.8 g/dL — ABNORMAL LOW (ref 12.0–15.0)
MCH: 24 pg — AB (ref 26.0–34.0)
MCHC: 29.6 g/dL — AB (ref 30.0–36.0)
MCV: 80.9 fL (ref 78.0–100.0)
PLATELETS: 166 10*3/uL (ref 150–400)
RBC: 3.67 MIL/uL — AB (ref 3.87–5.11)
RDW: 17.8 % — ABNORMAL HIGH (ref 11.5–15.5)
WBC: 7.3 10*3/uL (ref 4.0–10.5)

## 2017-10-28 MED ORDER — CHLORHEXIDINE GLUCONATE CLOTH 2 % EX PADS
6.0000 | MEDICATED_PAD | Freq: Every day | CUTANEOUS | Status: DC
  Administered 2017-10-28 – 2017-10-31 (×4): 6 via TOPICAL

## 2017-10-28 MED ORDER — OXYCODONE HCL 5 MG PO TABS
5.0000 mg | ORAL_TABLET | ORAL | Status: DC | PRN
  Administered 2017-10-28 – 2017-11-01 (×16): 10 mg via ORAL
  Filled 2017-10-28 (×7): qty 2
  Filled 2017-10-28: qty 1
  Filled 2017-10-28 (×3): qty 2
  Filled 2017-10-28: qty 1
  Filled 2017-10-28 (×6): qty 2

## 2017-10-28 MED ORDER — COMPLETENATE 29-1 MG PO CHEW
1.0000 | CHEWABLE_TABLET | Freq: Every day | ORAL | Status: DC
  Filled 2017-10-28 (×3): qty 1

## 2017-10-28 MED ORDER — PRENATAL MULTIVITAMIN CH
1.0000 | ORAL_TABLET | Freq: Every day | ORAL | Status: DC
  Administered 2017-10-28 – 2017-10-31 (×4): 1 via ORAL
  Filled 2017-10-28 (×6): qty 1

## 2017-10-28 MED ORDER — MORPHINE SULFATE (PF) 4 MG/ML IV SOLN
4.0000 mg | INTRAVENOUS | Status: DC | PRN
  Administered 2017-10-28 – 2017-11-01 (×9): 4 mg via INTRAVENOUS
  Filled 2017-10-28 (×10): qty 1
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
## 2017-10-28 NOTE — Progress Notes (Signed)
   Subjective: 2 Days Post-Op Procedure(s) (LRB): OPEN REDUCTION INTERNAL FIXATION (ORIF) TIBIAL PLATEAU, MEDIAL AND LATERAL CONDYLE FIXATION, RIGHT ANKLE SPLINT APPLICATION (Right) Patient reports pain as moderate and severe.    Objective: Vital signs in last 24 hours: Temp:  [97.8 F (36.6 C)-99 F (37.2 C)] 98.2 F (36.8 C) (06/20 0406) Pulse Rate:  [84-110] 110 (06/20 0406) Resp:  [16-25] 22 (06/20 0406) BP: (105-128)/(54-68) 112/55 (06/20 0406) SpO2:  [99 %-100 %] 100 % (06/20 0406)  Intake/Output from previous day: 06/19 0701 - 06/20 0700 In: 360 [P.O.:360] Out: 1375 [Urine:1375] Intake/Output this shift: No intake/output data recorded.  Recent Labs    10/26/17 0939 10/26/17 1015 10/27/17 0322  HGB 9.8* 10.2* 9.6*   Recent Labs    10/26/17 0939 10/26/17 1015 10/27/17 0322  WBC 6.3  --  8.6  RBC 4.07  --  3.97  HCT 33.5* 30.0* 32.2*  PLT 165  --  173   Recent Labs    10/26/17 0939 10/26/17 1015 10/27/17 0322  NA 136 136 134*  K 3.9 3.8 3.9  CL 110 107 106  CO2 19*  --  20*  BUN 6 5* 6  CREATININE 0.58 0.40* 0.53  GLUCOSE 125* 122* 164*  CALCIUM 8.5*  --  8.4*   Recent Labs    10/26/17 0939  INR 1.04    moves toes. sensation to toes intact.  No results found.  Assessment/Plan: 2 Days Post-Op Procedure(s) (LRB): OPEN REDUCTION INTERNAL FIXATION (ORIF) TIBIAL PLATEAU, MEDIAL AND LATERAL CONDYLE FIXATION, RIGHT ANKLE SPLINT APPLICATION (Right) Up with therapy, continued left elbow pain over radial neck. Will obtain xrays.   Eldred MangesMark C Finlee Milo 10/28/2017, 8:12 AM

## 2017-10-29 LAB — CBC
HCT: 30.2 % — ABNORMAL LOW (ref 36.0–46.0)
Hemoglobin: 8.9 g/dL — ABNORMAL LOW (ref 12.0–15.0)
MCH: 23.7 pg — ABNORMAL LOW (ref 26.0–34.0)
MCHC: 29.5 g/dL — ABNORMAL LOW (ref 30.0–36.0)
MCV: 80.3 fL (ref 78.0–100.0)
PLATELETS: 167 10*3/uL (ref 150–400)
RBC: 3.76 MIL/uL — AB (ref 3.87–5.11)
RDW: 17.9 % — ABNORMAL HIGH (ref 11.5–15.5)
WBC: 6.7 10*3/uL (ref 4.0–10.5)

## 2017-10-29 NOTE — Discharge Instructions (Signed)
Nonweightbearing to right lower extremity. Keep splint clean and dry. Platform weightbearing (ie can apply weight to forearm with platform walker) to left upper extremity, sling as needed
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
## 2017-10-29 NOTE — Progress Notes (Signed)
Orthopedic Tech Progress Note Patient Details:  Dorice LamasSheena G Tosh 1986-01-13 [id number]  Ortho Devices Type of Ortho Device: Arm sling, Shoulder immobilizer   Post Interventions Instructions Provided: Care of device   Saul FordyceJennifer C Alice Burnside 10/29/2017, 10:07 AM

## 2017-10-30 ENCOUNTER — Other Ambulatory Visit: Payer: Self-pay

## 2017-10-30 ENCOUNTER — Encounter (HOSPITAL_COMMUNITY): Payer: Self-pay | Admitting: *Deleted

## 2017-10-30 LAB — BPAM RBC
BLOOD PRODUCT EXPIRATION DATE: 201906272359
BLOOD PRODUCT EXPIRATION DATE: 201906282359
UNIT TYPE AND RH: 6200
UNIT TYPE AND RH: 6200

## 2017-10-30 LAB — TYPE AND SCREEN
ABO/RH(D): A POS
ANTIBODY SCREEN: POSITIVE
UNIT DIVISION: 0
Unit division: 0

## 2017-10-30 LAB — CDS SEROLOGY

## 2017-10-30 NOTE — Progress Notes (Signed)
Subjective: 4 Days Post-Op Procedure(s) (LRB): OPEN REDUCTION INTERNAL FIXATION (ORIF) TIBIAL PLATEAU, MEDIAL AND LATERAL CONDYLE FIXATION, RIGHT ANKLE SPLINT APPLICATION (Right) Patient reports pain as severe.  Left arm not in sling.   Objective: Vital signs in last 24 hours: Temp:  [97.5 F (36.4 C)-99.1 F (37.3 C)] 97.7 F (36.5 C) (06/22 0818) Pulse Rate:  [83-91] 91 (06/22 0818) Resp:  [16-29] 20 (06/22 0818) BP: (106-133)/(63-95) 123/72 (06/22 0818) SpO2:  [97 %-100 %] 99 % (06/22 0818)  Intake/Output from previous day: 06/21 0701 - 06/22 0700 In: 933 [P.O.:240; I.V.:693] Out: 475 [Urine:475] Intake/Output this shift: No intake/output data recorded.  Recent Labs    10/28/17 0911 10/29/17 0307  HGB 8.8* 8.9*   Recent Labs    10/28/17 0911 10/29/17 0307  WBC 7.3 6.7  RBC 3.67* 3.76*  HCT 29.7* 30.2*  PLT 166 167   No results for input(s): NA, K, CL, CO2, BUN, CREATININE, GLUCOSE, CALCIUM in the last 72 hours. No results for input(s): LABPT, INR in the last 72 hours.  Sensation intact distally Incision: dressing C/D/I  Able to wiggle toes without increase in pain.  Right thigh soft all compartments.      Assessment/Plan: 4 Days Post-Op Procedure(s) (LRB): OPEN REDUCTION INTERNAL FIXATION (ORIF) TIBIAL PLATEAU, MEDIAL AND LATERAL CONDYLE FIXATION, RIGHT ANKLE SPLINT APPLICATION (Right) Up with therapy non weight bearing right lower extremity Left radial neck fracture impacted non displaced. Arm in sling at all times. Non weight bearing left upper extremity below elbow.     Joan Becker 10/30/2017, 9:20 AM
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
## 2017-11-01 ENCOUNTER — Other Ambulatory Visit (HOSPITAL_COMMUNITY): Payer: Self-pay

## 2017-11-01 ENCOUNTER — Encounter (HOSPITAL_COMMUNITY): Payer: Self-pay | Admitting: *Deleted

## 2017-11-01 ENCOUNTER — Inpatient Hospital Stay (HOSPITAL_COMMUNITY): Payer: No Typology Code available for payment source

## 2017-11-01 MED ORDER — DOCUSATE SODIUM 100 MG PO CAPS: 100.0000 mg | ORAL_CAPSULE | Freq: Two times a day (BID) | ORAL | 0 refills | Status: DC

## 2017-11-01 MED ORDER — POLYETHYLENE GLYCOL 3350 17 G PO PACK: 17.0000 g | PACK | Freq: Every day | ORAL | 0 refills | Status: DC

## 2017-11-01 MED ORDER — OXYCODONE HCL 10 MG PO TABS: ORAL_TABLET | ORAL | 0 refills | Status: DC

## 2017-11-01 MED ORDER — ENOXAPARIN SODIUM 30 MG/0.3ML ~~LOC~~ SOLN: 30.0000 mg | Freq: Two times a day (BID) | SUBCUTANEOUS | 0 refills | Status: DC

## 2017-11-01 MED ORDER — ACETAMINOPHEN 325 MG PO TABS: 650.0000 mg | ORAL_TABLET | ORAL | Status: DC

## 2017-11-01 MED ORDER — PRENATAL MULTIVITAMIN CH: 1.0000 | ORAL_TABLET | Freq: Every day | ORAL | Status: DC
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
## 2017-11-02 ENCOUNTER — Encounter (HOSPITAL_COMMUNITY): Payer: Self-pay

## 2017-11-02 ENCOUNTER — Other Ambulatory Visit (HOSPITAL_COMMUNITY): Payer: Self-pay | Admitting: Obstetrics and Gynecology

## 2017-11-02 ENCOUNTER — Encounter: Payer: Self-pay | Admitting: *Deleted

## 2017-11-02 ENCOUNTER — Ambulatory Visit (HOSPITAL_COMMUNITY): Admission: RE | Admit: 2017-11-02 | Payer: Medicaid Other | Source: Ambulatory Visit

## 2017-11-02 ENCOUNTER — Telehealth (INDEPENDENT_AMBULATORY_CARE_PROVIDER_SITE_OTHER): Payer: Self-pay | Admitting: Orthopaedic Surgery

## 2017-11-02 DIAGNOSIS — O360130 Maternal care for anti-D [Rh] antibodies, third trimester, not applicable or unspecified: Secondary | ICD-10-CM

## 2017-11-02 DIAGNOSIS — O99213 Obesity complicating pregnancy, third trimester: Secondary | ICD-10-CM

## 2017-11-02 DIAGNOSIS — O99283 Endocrine, nutritional and metabolic diseases complicating pregnancy, third trimester: Secondary | ICD-10-CM

## 2017-11-02 DIAGNOSIS — Z3A28 28 weeks gestation of pregnancy: Secondary | ICD-10-CM

## 2017-11-02 DIAGNOSIS — E039 Hypothyroidism, unspecified: Secondary | ICD-10-CM

## 2017-11-02 NOTE — Telephone Encounter (Signed)
Please advise 

## 2017-11-02 NOTE — Telephone Encounter (Signed)
I called Joan Becker and advised.

## 2017-11-02 NOTE — Telephone Encounter (Signed)
Lanora ManisElizabeth, PT with [facility] requesting verbal orders  1 x for 2 weeks  CB # [phone]

## 2017-11-02 NOTE — Telephone Encounter (Signed)
Ok thanks 

## 2017-11-04 ENCOUNTER — Encounter: Payer: Medicaid Other | Admitting: Family Medicine

## 2017-11-09 ENCOUNTER — Encounter (HOSPITAL_COMMUNITY): Payer: Self-pay

## 2017-11-09 ENCOUNTER — Ambulatory Visit (INDEPENDENT_AMBULATORY_CARE_PROVIDER_SITE_OTHER): Payer: Medicaid Other | Admitting: Orthopaedic Surgery

## 2017-11-09 ENCOUNTER — Other Ambulatory Visit (HOSPITAL_COMMUNITY): Payer: Self-pay | Admitting: Obstetrics and Gynecology

## 2017-11-09 ENCOUNTER — Ambulatory Visit (HOSPITAL_COMMUNITY)
Admission: RE | Admit: 2017-11-09 | Discharge: 2017-11-09 | Disposition: A | Discharge status: Home / Self Care | Payer: Medicaid Other | Source: Ambulatory Visit | Attending: Obstetrics and Gynecology | Admitting: Obstetrics and Gynecology

## 2017-11-09 ENCOUNTER — Encounter (INDEPENDENT_AMBULATORY_CARE_PROVIDER_SITE_OTHER): Payer: Self-pay | Admitting: Orthopaedic Surgery

## 2017-11-09 VITALS — BP 118/70 | HR 98

## 2017-11-09 DIAGNOSIS — O09293 Supervision of pregnancy with other poor reproductive or obstetric history, third trimester: Secondary | ICD-10-CM | POA: Diagnosis not present

## 2017-11-09 DIAGNOSIS — S52135D Nondisplaced fracture of neck of left radius, subsequent encounter for closed fracture with routine healing: Secondary | ICD-10-CM

## 2017-11-09 DIAGNOSIS — Z362 Encounter for other antenatal screening follow-up: Secondary | ICD-10-CM | POA: Diagnosis present

## 2017-11-09 DIAGNOSIS — E039 Hypothyroidism, unspecified: Secondary | ICD-10-CM | POA: Diagnosis not present

## 2017-11-09 DIAGNOSIS — O360130 Maternal care for anti-D [Rh] antibodies, third trimester, not applicable or unspecified: Secondary | ICD-10-CM | POA: Insufficient documentation

## 2017-11-09 DIAGNOSIS — O99213 Obesity complicating pregnancy, third trimester: Secondary | ICD-10-CM | POA: Diagnosis not present

## 2017-11-09 DIAGNOSIS — O99283 Endocrine, nutritional and metabolic diseases complicating pregnancy, third trimester: Secondary | ICD-10-CM | POA: Diagnosis not present

## 2017-11-09 DIAGNOSIS — O321XX0 Maternal care for breech presentation, not applicable or unspecified: Secondary | ICD-10-CM | POA: Insufficient documentation

## 2017-11-09 DIAGNOSIS — Z3A28 28 weeks gestation of pregnancy: Secondary | ICD-10-CM | POA: Insufficient documentation

## 2017-11-09 DIAGNOSIS — O361120 Maternal care for Anti-A sensitization, second trimester, not applicable or unspecified: Secondary | ICD-10-CM

## 2017-11-09 DIAGNOSIS — S52135A Nondisplaced fracture of neck of left radius, initial encounter for closed fracture: Secondary | ICD-10-CM | POA: Insufficient documentation
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
## 2017-11-12 ENCOUNTER — Telehealth (INDEPENDENT_AMBULATORY_CARE_PROVIDER_SITE_OTHER): Payer: Self-pay | Admitting: Orthopaedic Surgery

## 2017-11-12 NOTE — Telephone Encounter (Signed)
Ranae PalmsElizabeth Hildreth, PT, [facility] called requesting an order change for this patient.  She stated that the patient wanted her PT session changed from this week to next week after she has her hard cast.  CB#[phone].  Thank you.

## 2017-11-12 NOTE — Telephone Encounter (Signed)
Ok for change.

## 2017-11-14 ENCOUNTER — Encounter: Payer: Self-pay | Admitting: Family Medicine

## 2017-11-15 NOTE — Telephone Encounter (Signed)
Ucall. OK thanks 

## 2017-11-15 NOTE — Telephone Encounter (Signed)
I called Elizabeth and advised.  

## 2017-11-16 ENCOUNTER — Telehealth: Payer: Self-pay

## 2017-11-16 ENCOUNTER — Ambulatory Visit (INDEPENDENT_AMBULATORY_CARE_PROVIDER_SITE_OTHER): Payer: Medicaid Other

## 2017-11-16 ENCOUNTER — Encounter (INDEPENDENT_AMBULATORY_CARE_PROVIDER_SITE_OTHER): Payer: Self-pay | Admitting: Orthopaedic Surgery

## 2017-11-16 ENCOUNTER — Ambulatory Visit (INDEPENDENT_AMBULATORY_CARE_PROVIDER_SITE_OTHER): Payer: Medicaid Other | Admitting: Orthopaedic Surgery

## 2017-11-16 VITALS — BP 112/78 | HR 96 | Ht 67.0 in | Wt 310.0 lb

## 2017-11-16 DIAGNOSIS — M25571 Pain in right ankle and joints of right foot: Secondary | ICD-10-CM

## 2017-11-16 DIAGNOSIS — S8264XA Nondisplaced fracture of lateral malleolus of right fibula, initial encounter for closed fracture: Secondary | ICD-10-CM | POA: Insufficient documentation

## 2017-11-16 DIAGNOSIS — S82141A Displaced bicondylar fracture of right tibia, initial encounter for closed fracture: Secondary | ICD-10-CM | POA: Insufficient documentation

## 2017-11-16 DIAGNOSIS — S8264XD Nondisplaced fracture of lateral malleolus of right fibula, subsequent encounter for closed fracture with routine healing: Secondary | ICD-10-CM

## 2017-11-16 DIAGNOSIS — S82141D Displaced bicondylar fracture of right tibia, subsequent encounter for closed fracture with routine healing: Secondary | ICD-10-CM

## 2017-11-16 DIAGNOSIS — S52135D Nondisplaced fracture of neck of left radius, subsequent encounter for closed fracture with routine healing: Secondary | ICD-10-CM
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
## 2017-11-16 NOTE — Telephone Encounter (Signed)
Pt called the office on 7/3 stating that she was prescribed a fourteen day supply of Lovenox. Pt states that she will probably run out of the medication  A day before her scheduled appt on 11/17/17. Pt wanted to know if it was ok to miss a dose of the medication. Pt states that she should have enough but just wanted to make sure. Pt informed that the medication will be refilled if needed at her appointment. Understanding was voiced.

## 2017-11-17 ENCOUNTER — Ambulatory Visit (INDEPENDENT_AMBULATORY_CARE_PROVIDER_SITE_OTHER): Payer: Medicaid Other | Admitting: Obstetrics & Gynecology

## 2017-11-17 VITALS — BP 125/81 | HR 95

## 2017-11-17 DIAGNOSIS — O361190 Maternal care for Anti-A sensitization, unspecified trimester, not applicable or unspecified: Secondary | ICD-10-CM

## 2017-11-17 DIAGNOSIS — R768 Other specified abnormal immunological findings in serum: Secondary | ICD-10-CM

## 2017-11-17 DIAGNOSIS — O99213 Obesity complicating pregnancy, third trimester: Secondary | ICD-10-CM

## 2017-11-17 DIAGNOSIS — O99210 Obesity complicating pregnancy, unspecified trimester: Secondary | ICD-10-CM

## 2017-11-17 DIAGNOSIS — Z8632 Personal history of gestational diabetes: Secondary | ICD-10-CM

## 2017-11-17 DIAGNOSIS — O0993 Supervision of high risk pregnancy, unspecified, third trimester: Secondary | ICD-10-CM

## 2017-11-17 DIAGNOSIS — O0990 Supervision of high risk pregnancy, unspecified, unspecified trimester: Secondary | ICD-10-CM
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
## 2017-11-25 ENCOUNTER — Encounter (HOSPITAL_COMMUNITY): Payer: Self-pay

## 2017-11-25 ENCOUNTER — Ambulatory Visit (HOSPITAL_COMMUNITY)
Admission: RE | Admit: 2017-11-25 | Discharge: 2017-11-25 | Disposition: A | Discharge status: Home / Self Care | Payer: Medicaid Other | Source: Ambulatory Visit | Attending: Obstetrics & Gynecology | Admitting: Obstetrics & Gynecology

## 2017-11-25 ENCOUNTER — Other Ambulatory Visit (HOSPITAL_COMMUNITY): Payer: Self-pay | Admitting: Obstetrics and Gynecology

## 2017-11-25 DIAGNOSIS — Z3A28 28 weeks gestation of pregnancy: Secondary | ICD-10-CM

## 2017-11-25 DIAGNOSIS — O09293 Supervision of pregnancy with other poor reproductive or obstetric history, third trimester: Secondary | ICD-10-CM | POA: Insufficient documentation

## 2017-11-25 DIAGNOSIS — Z3A30 30 weeks gestation of pregnancy: Secondary | ICD-10-CM | POA: Insufficient documentation

## 2017-11-25 DIAGNOSIS — E039 Hypothyroidism, unspecified: Secondary | ICD-10-CM

## 2017-11-25 DIAGNOSIS — O99283 Endocrine, nutritional and metabolic diseases complicating pregnancy, third trimester: Secondary | ICD-10-CM | POA: Insufficient documentation

## 2017-11-25 DIAGNOSIS — O99213 Obesity complicating pregnancy, third trimester: Secondary | ICD-10-CM | POA: Diagnosis not present

## 2017-11-25 DIAGNOSIS — O360130 Maternal care for anti-D [Rh] antibodies, third trimester, not applicable or unspecified: Secondary | ICD-10-CM

## 2017-11-25 DIAGNOSIS — E669 Obesity, unspecified: Secondary | ICD-10-CM | POA: Diagnosis not present

## 2017-11-26 ENCOUNTER — Other Ambulatory Visit (HOSPITAL_COMMUNITY): Payer: Self-pay | Admitting: *Deleted

## 2017-11-26 DIAGNOSIS — O361930 Maternal care for other isoimmunization, third trimester, not applicable or unspecified: Secondary | ICD-10-CM

## 2017-11-28 ENCOUNTER — Encounter: Payer: Self-pay | Admitting: Obstetrics & Gynecology

## 2017-11-29 ENCOUNTER — Other Ambulatory Visit: Payer: Self-pay | Admitting: Obstetrics & Gynecology

## 2017-11-29 ENCOUNTER — Other Ambulatory Visit: Payer: Self-pay

## 2017-11-29 ENCOUNTER — Telehealth: Payer: Self-pay

## 2017-11-29 MED ORDER — ENOXAPARIN SODIUM 40 MG/0.4ML ~~LOC~~ SOLN: 40.0000 mg | SUBCUTANEOUS | 6 refills | Status: DC

## 2017-11-29 NOTE — Telephone Encounter (Signed)
-----   Message from Willodean Rosenthalarolyn Harraway-Smith, MD sent at 11/29/2017  8:34 AM EDT ----- Please call pt. She needs to restart Lovenox. The dosage has changed it is 40mg  daily. NOT bid.   Thx, clh-S

## 2017-11-29 NOTE — Telephone Encounter (Signed)
Attmepted to reach patient by phone. Goes straight to voicemail. Will also reach out to patient via my chart. Armandina StammerJennifer Howard RN

## 2017-11-30 ENCOUNTER — Encounter (INDEPENDENT_AMBULATORY_CARE_PROVIDER_SITE_OTHER): Payer: Self-pay | Admitting: Orthopaedic Surgery

## 2017-12-01 ENCOUNTER — Ambulatory Visit (INDEPENDENT_AMBULATORY_CARE_PROVIDER_SITE_OTHER): Payer: Medicaid Other | Admitting: Obstetrics & Gynecology

## 2017-12-01 ENCOUNTER — Encounter: Payer: Self-pay | Admitting: Obstetrics & Gynecology

## 2017-12-01 VITALS — BP 98/63 | HR 111

## 2017-12-01 DIAGNOSIS — O99213 Obesity complicating pregnancy, third trimester: Secondary | ICD-10-CM

## 2017-12-01 DIAGNOSIS — O99283 Endocrine, nutritional and metabolic diseases complicating pregnancy, third trimester: Secondary | ICD-10-CM

## 2017-12-01 DIAGNOSIS — O0993 Supervision of high risk pregnancy, unspecified, third trimester: Secondary | ICD-10-CM

## 2017-12-01 DIAGNOSIS — E039 Hypothyroidism, unspecified: Secondary | ICD-10-CM

## 2017-12-01 DIAGNOSIS — O99210 Obesity complicating pregnancy, unspecified trimester: Secondary | ICD-10-CM

## 2017-12-01 DIAGNOSIS — O2343 Unspecified infection of urinary tract in pregnancy, third trimester: Secondary | ICD-10-CM

## 2017-12-01 DIAGNOSIS — Z8632 Personal history of gestational diabetes: Secondary | ICD-10-CM

## 2017-12-01 DIAGNOSIS — O0990 Supervision of high risk pregnancy, unspecified, unspecified trimester: Secondary | ICD-10-CM

## 2017-12-01 DIAGNOSIS — O361930 Maternal care for other isoimmunization, third trimester, not applicable or unspecified: Secondary | ICD-10-CM

## 2017-12-01 DIAGNOSIS — O2340 Unspecified infection of urinary tract in pregnancy, unspecified trimester: Secondary | ICD-10-CM

## 2017-12-01 DIAGNOSIS — R768 Other specified abnormal immunological findings in serum: Secondary | ICD-10-CM

## 2017-12-01 LAB — POCT URINALYSIS DIPSTICK
Glucose, UA: POSITIVE — AB
Nitrite, UA: NEGATIVE
Protein, UA: POSITIVE — AB
SPEC GRAV UA: 1.02 (ref 1.010–1.025)

## 2017-12-01 MED ORDER — CEPHALEXIN 500 MG PO CAPS: 500.0000 mg | ORAL_CAPSULE | Freq: Three times a day (TID) | ORAL | 0 refills | Status: DC
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
## 2017-12-01 NOTE — Patient Instructions (Signed)
Eating Plan for Pregnant Women While you are pregnant, your body will require additional nutrition to help support your growing baby. It is recommended that you consume:  150 additional calories each day during your first trimester.  300 additional calories each day during your second trimester.  300 additional calories each day during your third trimester.  Eating a healthy, well-balanced diet is very important for your health and for your baby's health. You also have a higher need for some vitamins and minerals, such as folic acid, calcium, iron, and vitamin D. What do I need to know about eating during pregnancy?  Do not try to lose weight or go on a diet during pregnancy.  Choose healthy, nutritious foods. Choose  of a sandwich with a glass of milk instead of a candy bar or a high-calorie sugar-sweetened beverage.  Limit your overall intake of foods that have "empty calories." These are foods that have little nutritional value, such as sweets, desserts, candies, sugar-sweetened beverages, and fried foods.  Eat a variety of foods, especially fruits and vegetables.  Take a prenatal vitamin to help meet the additional needs during pregnancy, specifically for folic acid, iron, calcium, and vitamin D.  Remember to stay active. Ask your health care provider for exercise recommendations that are specific to you.  Practice good food safety and cleanliness, such as washing your hands before you eat and after you prepare raw meat. This helps to prevent foodborne illnesses, such as listeriosis, that can be very dangerous for your baby. Ask your health care provider for more information about listeriosis. What does 150 extra calories look like? Healthy options for an additional 150 calories each day could be any of the following:  Plain low-fat yogurt (6-8 oz) with  cup of berries.  1 apple with 2 teaspoons of peanut butter.  Cut-up vegetables with  cup of hummus.  Low-fat chocolate  milk (8 oz or 1 cup).  1 string cheese with 1 medium orange.   of a peanut butter and jelly sandwich on whole-wheat bread (1 tsp of peanut butter).  For 300 calories, you could eat two of those healthy options each day. What is a healthy amount of weight to gain? The recommended amount of weight for you to gain is based on your pre-pregnancy BMI. If your pre-pregnancy BMI was:  Less than 18 (underweight), you should gain 28-40 lb.  18-24.9 (normal), you should gain 25-35 lb.  25-29.9 (overweight), you should gain 15-25 lb.  Greater than 30 (obese), you should gain 11-20 lb.  What if I am having twins or multiples? Generally, pregnant women who will be having twins or multiples may need to increase their daily calories by 300-600 calories each day. The recommended range for total weight gain is 25-54 lb, depending on your pre-pregnancy BMI. Talk with your health care provider for specific guidance about additional nutritional needs, weight gain, and exercise during your pregnancy. What foods can I eat? Grains Any grains. Try to choose whole grains, such as whole-wheat bread, oatmeal, or brown rice. Vegetables Any vegetables. Try to eat a variety of colors and types of vegetables to get a full range of vitamins and minerals. Remember to wash your vegetables well before eating. Fruits Any fruits. Try to eat a variety of colors and types of fruit to get a full range of vitamins and minerals. Remember to wash your fruits well before eating. Meats and Other Protein Sources Lean meats, including chicken, turkey, fish, and lean cuts of beef, veal,   or pork. Make sure that all meats are cooked to "well done." Tofu. Tempeh. Beans. Eggs. Peanut butter and other nut butters. Seafood, such as shrimp, crab, and lobster. If you choose fish, select types that are higher in omega-3 fatty acids, including salmon, herring, mussels, trout, sardines, and pollock. Make sure that all meats are cooked to  food-safe temperatures. Dairy Pasteurized milk and milk alternatives. Pasteurized yogurt and pasteurized cheese. Cottage cheese. Sour cream. Beverages Water. Juices that contain 100% fruit juice or vegetable juice. Caffeine-free teas and decaffeinated coffee. Drinks that contain caffeine are okay to drink, but it is better to avoid caffeine. Keep your total caffeine intake to less than 200 mg each day (12 oz of coffee, tea, or soda) or as directed by your health care provider. Condiments Any pasteurized condiments. Sweets and Desserts Any sweets and desserts. Fats and Oils Any fats and oils. The items listed above may not be a complete list of recommended foods or beverages. Contact your dietitian for more options. What foods are not recommended? Vegetables Unpasteurized (raw) vegetable juices. Fruits Unpasteurized (raw) fruit juices. Meats and Other Protein Sources Cured meats that have nitrates, such as bacon, salami, and hotdogs. Luncheon meats, bologna, or other deli meats (unless they are reheated until they are steaming hot). Refrigerated pate, meat spreads from a meat counter, smoked seafood that is found in the refrigerated section of a store. Raw fish, such as sushi or sashimi. High mercury content fish, such as tilefish, shark, swordfish, and king mackerel. Raw meats, such as tuna or beef tartare. Undercooked meats and poultry. Make sure that all meats are cooked to food-safe temperatures. Dairy Unpasteurized (raw) milk and any foods that have raw milk in them. Soft cheeses, such as feta, queso blanco, queso fresco, Brie, Camembert cheeses, blue-veined cheeses, and Panela cheese (unless it is made with pasteurized milk, which must be stated on the label). Beverages Alcohol. Sugar-sweetened beverages, such as sodas, teas, or energy drinks. Condiments Homemade fermented foods and drinks, such as pickles, sauerkraut, or kombucha drinks. (Store-bought pasteurized versions of these are  okay.) Other Salads that are made in the store, such as ham salad, chicken salad, egg salad, tuna salad, and seafood salad. The items listed above may not be a complete list of foods and beverages to avoid. Contact your dietitian for more information. This information is not intended to replace advice given to you by your health care provider. Make sure you discuss any questions you have with your health care provider. Document Released: 02/09/2014 Document Revised: 10/03/2015 Document Reviewed: 10/10/2013 Elsevier Interactive Patient Education  2018 [employer]   

## 2017-12-03 LAB — URINE CULTURE

## 2017-12-06 ENCOUNTER — Encounter: Payer: Self-pay | Admitting: Obstetrics & Gynecology

## 2017-12-07 ENCOUNTER — Telehealth: Payer: Self-pay

## 2017-12-07 ENCOUNTER — Other Ambulatory Visit: Payer: Self-pay

## 2017-12-07 DIAGNOSIS — E039 Hypothyroidism, unspecified: Secondary | ICD-10-CM

## 2017-12-07 DIAGNOSIS — N898 Other specified noninflammatory disorders of vagina: Secondary | ICD-10-CM

## 2017-12-07 DIAGNOSIS — O0990 Supervision of high risk pregnancy, unspecified, unspecified trimester: Secondary | ICD-10-CM

## 2017-12-07 DIAGNOSIS — R768 Other specified abnormal immunological findings in serum: Secondary | ICD-10-CM

## 2017-12-07 DIAGNOSIS — O361930 Maternal care for other isoimmunization, third trimester, not applicable or unspecified: Secondary | ICD-10-CM

## 2017-12-07 DIAGNOSIS — Z3490 Encounter for supervision of normal pregnancy, unspecified, unspecified trimester: Secondary | ICD-10-CM

## 2017-12-07 DIAGNOSIS — O99210 Obesity complicating pregnancy, unspecified trimester: Secondary | ICD-10-CM

## 2017-12-07 DIAGNOSIS — O99283 Endocrine, nutritional and metabolic diseases complicating pregnancy, third trimester: Secondary | ICD-10-CM

## 2017-12-07 MED ORDER — TERCONAZOLE 0.4 % VA CREA: 1.0000 | TOPICAL_CREAM | Freq: Every day | VAGINAL | 0 refills | Status: AC

## 2017-12-07 NOTE — Telephone Encounter (Signed)
Orders placed and messaged patient. Armandina StammerJennifer Howard RN

## 2017-12-07 NOTE — Progress Notes (Signed)
Pt emailed stating that she is having yeast infection symptoms from the keflex that was prescribed to her on 12/01/17. Pt aware that medication will be sent to her pharmacy.

## 2017-12-07 NOTE — Telephone Encounter (Signed)
-----   Message from Willodean Rosenthalarolyn Harraway-Smith, MD sent at 12/01/2017  4:00 PM EDT ----- Please schedule pt for antenatal testing. She can do weekly BPPs at [facility] with NST here.   clh-S

## 2017-12-08 ENCOUNTER — Ambulatory Visit (INDEPENDENT_AMBULATORY_CARE_PROVIDER_SITE_OTHER): Payer: Medicaid Other

## 2017-12-08 ENCOUNTER — Encounter (HOSPITAL_COMMUNITY): Payer: Self-pay

## 2017-12-08 ENCOUNTER — Ambulatory Visit (HOSPITAL_COMMUNITY)
Admission: RE | Admit: 2017-12-08 | Discharge: 2017-12-08 | Disposition: A | Discharge status: Home / Self Care | Payer: Medicaid Other | Source: Ambulatory Visit | Attending: Obstetrics & Gynecology | Admitting: Obstetrics & Gynecology

## 2017-12-08 DIAGNOSIS — O09293 Supervision of pregnancy with other poor reproductive or obstetric history, third trimester: Secondary | ICD-10-CM

## 2017-12-08 DIAGNOSIS — E039 Hypothyroidism, unspecified: Secondary | ICD-10-CM

## 2017-12-08 DIAGNOSIS — O0993 Supervision of high risk pregnancy, unspecified, third trimester: Secondary | ICD-10-CM | POA: Diagnosis not present

## 2017-12-08 DIAGNOSIS — O2340 Unspecified infection of urinary tract in pregnancy, unspecified trimester: Secondary | ICD-10-CM

## 2017-12-08 DIAGNOSIS — R768 Other specified abnormal immunological findings in serum: Secondary | ICD-10-CM

## 2017-12-08 DIAGNOSIS — O99213 Obesity complicating pregnancy, third trimester: Secondary | ICD-10-CM

## 2017-12-08 DIAGNOSIS — O361930 Maternal care for other isoimmunization, third trimester, not applicable or unspecified: Secondary | ICD-10-CM | POA: Diagnosis not present

## 2017-12-08 DIAGNOSIS — O99283 Endocrine, nutritional and metabolic diseases complicating pregnancy, third trimester: Secondary | ICD-10-CM | POA: Diagnosis not present

## 2017-12-08 DIAGNOSIS — Z3A32 32 weeks gestation of pregnancy: Secondary | ICD-10-CM | POA: Insufficient documentation

## 2017-12-08 DIAGNOSIS — O99210 Obesity complicating pregnancy, unspecified trimester: Secondary | ICD-10-CM

## 2017-12-08 DIAGNOSIS — O0990 Supervision of high risk pregnancy, unspecified, unspecified trimester: Secondary | ICD-10-CM

## 2017-12-08 NOTE — Progress Notes (Addendum)
Patient for NST only visit. Patient is having BPPs done weekly and will return here next week for NST visit. Armandina StammerJennifer Shyheem Whitham RN  NST reviewed and reactive.  Carolyn L. Harraway-Smith, M.D., Evern CoreFACOG

## 2017-12-15 ENCOUNTER — Other Ambulatory Visit: Payer: Self-pay | Admitting: Obstetrics & Gynecology

## 2017-12-15 ENCOUNTER — Ambulatory Visit (HOSPITAL_COMMUNITY)
Admission: RE | Admit: 2017-12-15 | Discharge: 2017-12-15 | Disposition: A | Discharge status: Home / Self Care | Payer: Medicaid Other | Source: Ambulatory Visit | Attending: Obstetrics & Gynecology | Admitting: Obstetrics & Gynecology

## 2017-12-15 ENCOUNTER — Encounter: Payer: Self-pay | Admitting: Obstetrics & Gynecology

## 2017-12-15 ENCOUNTER — Ambulatory Visit (INDEPENDENT_AMBULATORY_CARE_PROVIDER_SITE_OTHER): Payer: Medicaid Other | Admitting: Obstetrics & Gynecology

## 2017-12-15 ENCOUNTER — Encounter (HOSPITAL_COMMUNITY): Payer: Self-pay

## 2017-12-15 VITALS — BP 121/64 | HR 95

## 2017-12-15 DIAGNOSIS — O24419 Gestational diabetes mellitus in pregnancy, unspecified control: Secondary | ICD-10-CM

## 2017-12-15 DIAGNOSIS — O99213 Obesity complicating pregnancy, third trimester: Secondary | ICD-10-CM | POA: Diagnosis not present

## 2017-12-15 DIAGNOSIS — Z3493 Encounter for supervision of normal pregnancy, unspecified, third trimester: Secondary | ICD-10-CM | POA: Diagnosis not present

## 2017-12-15 DIAGNOSIS — O360130 Maternal care for anti-D [Rh] antibodies, third trimester, not applicable or unspecified: Secondary | ICD-10-CM | POA: Diagnosis not present

## 2017-12-15 DIAGNOSIS — O99283 Endocrine, nutritional and metabolic diseases complicating pregnancy, third trimester: Secondary | ICD-10-CM

## 2017-12-15 DIAGNOSIS — Z8632 Personal history of gestational diabetes: Secondary | ICD-10-CM

## 2017-12-15 DIAGNOSIS — O0990 Supervision of high risk pregnancy, unspecified, unspecified trimester: Secondary | ICD-10-CM

## 2017-12-15 DIAGNOSIS — Z3A33 33 weeks gestation of pregnancy: Secondary | ICD-10-CM

## 2017-12-15 DIAGNOSIS — O361930 Maternal care for other isoimmunization, third trimester, not applicable or unspecified: Secondary | ICD-10-CM

## 2017-12-15 DIAGNOSIS — Z3490 Encounter for supervision of normal pregnancy, unspecified, unspecified trimester: Secondary | ICD-10-CM

## 2017-12-15 DIAGNOSIS — O99210 Obesity complicating pregnancy, unspecified trimester: Secondary | ICD-10-CM

## 2017-12-15 DIAGNOSIS — O361190 Maternal care for Anti-A sensitization, unspecified trimester, not applicable or unspecified: Secondary | ICD-10-CM

## 2017-12-15 DIAGNOSIS — O2340 Unspecified infection of urinary tract in pregnancy, unspecified trimester: Secondary | ICD-10-CM

## 2017-12-15 DIAGNOSIS — O0993 Supervision of high risk pregnancy, unspecified, third trimester: Secondary | ICD-10-CM

## 2017-12-15 DIAGNOSIS — E039 Hypothyroidism, unspecified: Secondary | ICD-10-CM

## 2017-12-15 MED ORDER — GLYBURIDE 2.5 MG PO TABS: 2.5000 mg | ORAL_TABLET | Freq: Two times a day (BID) | ORAL | 3 refills | Status: DC
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
## 2017-12-15 NOTE — Progress Notes (Signed)
Patient refuse weigth due to cast and unable to do weight bearing on right foot. Armandina StammerJennifer Howard RN

## 2017-12-16 ENCOUNTER — Other Ambulatory Visit: Payer: Self-pay

## 2017-12-16 MED ORDER — GLUCOSE BLOOD VI STRP: 1.0000 | ORAL_STRIP | 12 refills | Status: DC

## 2017-12-19 ENCOUNTER — Other Ambulatory Visit: Payer: Self-pay

## 2017-12-19 ENCOUNTER — Encounter (HOSPITAL_COMMUNITY): Payer: Self-pay | Admitting: *Deleted

## 2017-12-19 ENCOUNTER — Inpatient Hospital Stay (HOSPITAL_COMMUNITY)
Admission: AD | Admit: 2017-12-19 | Discharge: 2017-12-19 | Disposition: A | Discharge status: Home / Self Care | Payer: Medicaid Other | Source: Ambulatory Visit | Attending: Obstetrics and Gynecology | Admitting: Obstetrics and Gynecology

## 2017-12-19 DIAGNOSIS — O2340 Unspecified infection of urinary tract in pregnancy, unspecified trimester: Secondary | ICD-10-CM

## 2017-12-19 DIAGNOSIS — O2343 Unspecified infection of urinary tract in pregnancy, third trimester: Secondary | ICD-10-CM | POA: Insufficient documentation

## 2017-12-19 DIAGNOSIS — Z3A35 35 weeks gestation of pregnancy: Secondary | ICD-10-CM | POA: Insufficient documentation

## 2017-12-19 DIAGNOSIS — O99213 Obesity complicating pregnancy, third trimester: Secondary | ICD-10-CM | POA: Insufficient documentation

## 2017-12-19 DIAGNOSIS — O99891 Other specified diseases and conditions complicating pregnancy: Secondary | ICD-10-CM

## 2017-12-19 DIAGNOSIS — O24419 Gestational diabetes mellitus in pregnancy, unspecified control: Secondary | ICD-10-CM

## 2017-12-19 DIAGNOSIS — M549 Dorsalgia, unspecified: Secondary | ICD-10-CM | POA: Insufficient documentation

## 2017-12-19 DIAGNOSIS — R109 Unspecified abdominal pain: Secondary | ICD-10-CM | POA: Diagnosis present

## 2017-12-19 DIAGNOSIS — R51 Headache: Secondary | ICD-10-CM | POA: Insufficient documentation

## 2017-12-19 DIAGNOSIS — O26893 Other specified pregnancy related conditions, third trimester: Secondary | ICD-10-CM | POA: Diagnosis not present

## 2017-12-19 DIAGNOSIS — O9989 Other specified diseases and conditions complicating pregnancy, childbirth and the puerperium: Secondary | ICD-10-CM

## 2017-12-19 LAB — URINALYSIS, ROUTINE W REFLEX MICROSCOPIC
Bilirubin Urine: NEGATIVE
Glucose, UA: >=500 mg/dL — AB
HGB URINE DIPSTICK: NEGATIVE
KETONES UR: NEGATIVE mg/dL
NITRITE: NEGATIVE
Protein, ur: NEGATIVE mg/dL
Specific Gravity, Urine: 1.012 (ref 1.005–1.030)
pH: 6 (ref 5.0–8.0)

## 2017-12-19 MED ORDER — BUTALBITAL-APAP-CAFFEINE 50-325-40 MG PO TABS
1.0000 | ORAL_TABLET | Freq: Once | ORAL | Status: AC
  Administered 2017-12-19: 1 via ORAL
  Filled 2017-12-19: qty 1

## 2017-12-19 NOTE — MAU Provider Note (Signed)
History   G3 P2-0-0-2 at 34 weeks and 2 days and with lower abdominal pain low back pain and headache this is been going on for the largest part of the day.  He states she took Tylenol for the headache and is not helping.  She also states she has had some vaginal discharge.  She denies vaginal bleeding.  CSN: [id number]  Arrival date & time 12/19/17  1815   None     Chief Complaint  Patient presents with  . Headache  . Back Pain  . Abdominal Pain  . Rupture of Membranes    HPI  Past Medical History:  Diagnosis Date  . Abdominal pain in pregnancy, antepartum 06/06/2014  . Gestational diabetes   . Hypothyroidism   . Kidney stones   . Obesity   . PCOS (polycystic ovarian syndrome)   . Postpartum care following vaginal delivery (4/9) 08/19/2014  . Thyroid disease     Past Surgical History:  Procedure Laterality Date  . ADENOIDECTOMY    . DILATION AND CURETTAGE OF UTERUS    . HYSTEROSCOPY W/D&C N/A 08/11/2012   Procedure: DILATATION AND CURETTAGE /HYSTEROSCOPY;  Surgeon: Adam Phenix, MD;  Location: [facility];  Service: Gynecology;  Laterality: N/A;  . MYRINGOTOMY    . ORIF TIBIA PLATEAU Right 10/26/2017   Procedure: OPEN REDUCTION INTERNAL FIXATION (ORIF) TIBIAL PLATEAU, MEDIAL AND LATERAL CONDYLE FIXATION, RIGHT ANKLE SPLINT APPLICATION;  Surgeon: Eldred Manges, MD;  Location: [facility];  Service: Orthopedics;  Laterality: Right;  . TONSILLECTOMY    . TYMPANOSTOMY TUBE PLACEMENT    . WISDOM TOOTH EXTRACTION      Family History  Problem Relation Age of Onset  . Hearing loss Maternal Aunt   . Hearing loss Maternal Uncle   . Hearing loss Paternal Aunt   . Hearing loss Paternal Uncle   . Hearing loss Maternal Grandmother   . Hearing loss Maternal Grandfather   . Hearing loss Paternal Grandmother   . Hearing loss Paternal Grandfather   . Diabetes Father   . Hypertension Father   . Cancer Neg Hx     Social History   Tobacco Use  . Smoking status: Never Smoker  . Smokeless  tobacco: Never Used  Substance Use Topics  . Alcohol use: Not Currently  . Drug use: Never    OB History    Gravida  3   Para  2   Term  2   Preterm  0   AB  0   Living  2     SAB  0   TAB  0   Ectopic  0   Multiple      Live Births  2           Review of Systems  Constitutional: Negative.   HENT: Negative.   Eyes: Negative.   Respiratory: Negative.   Gastrointestinal: Positive for abdominal pain.  Endocrine: Negative.   Genitourinary: Positive for vaginal discharge.  Musculoskeletal: Positive for back pain.  Skin: Negative.   Allergic/Immunologic: Negative.   Neurological: Negative.   Hematological: Negative.   Psychiatric/Behavioral: Negative.     Allergies  Patient has no known allergies.  Home Medications    BP (!) 141/75 (BP Location: Right Arm)   Pulse 97   Temp (!) 97.5 F (36.4 C) (Oral)   Resp 20   Ht 5' 7.5" (1.715 m)   LMP 04/02/2017   SpO2 100%   BMI 47.84 kg/m   Physical Exam  Constitutional:  She is oriented to person, place, and time. She appears well-developed and well-nourished.  HENT:  Head: Normocephalic.  Neck: Normal range of motion.  Cardiovascular: Normal rate, regular rhythm, normal heart sounds and intact distal pulses.  Pulmonary/Chest: Effort normal and breath sounds normal.  Abdominal: Soft. Bowel sounds are normal.  Neurological: She is alert and oriented to person, place, and time. She has normal strength. She displays a negative Romberg sign.  Skin: Skin is warm and dry.  Psychiatric: She has a normal mood and affect. Her behavior is normal.    MAU Course  Procedures (including critical care time)  Labs Reviewed  URINALYSIS, ROUTINE W REFLEX MICROSCOPIC   No results found.   1. Abdominal pain in pregnancy, third trimester   2. Gestational diabetes mellitus (GDM) affecting pregnancy   3. UTI in pregnancy, antepartum   4. Headache in pregnancy, antepartum, third trimester       MDM  Vital  signs are stable.  Fetal heart rate pattern is reassuring with 15 x 15  acells no decelerations and no contractions.  Sterile vaginal exam patient is closed thick posterior of the pelvis.  Fern test obtained and negative.  Will discharge home after treating patient's headache.

## 2017-12-19 NOTE — Discharge Instructions (Signed)

## 2017-12-19 NOTE — MAU Note (Signed)
Pt presents with c/o lower back pain, abdominal cramping, H/A, and blurred vision.  Pt also c/o LOF, clear.  Denies VB, spotting with wiping x1.  Reports +FM.

## 2017-12-21 ENCOUNTER — Ambulatory Visit (INDEPENDENT_AMBULATORY_CARE_PROVIDER_SITE_OTHER): Payer: Self-pay

## 2017-12-21 ENCOUNTER — Encounter (INDEPENDENT_AMBULATORY_CARE_PROVIDER_SITE_OTHER): Payer: Self-pay | Admitting: Orthopaedic Surgery

## 2017-12-21 ENCOUNTER — Ambulatory Visit (INDEPENDENT_AMBULATORY_CARE_PROVIDER_SITE_OTHER): Payer: Medicaid Other | Admitting: Orthopaedic Surgery

## 2017-12-21 VITALS — BP 127/74 | HR 91

## 2017-12-21 DIAGNOSIS — S52135D Nondisplaced fracture of neck of left radius, subsequent encounter for closed fracture with routine healing: Secondary | ICD-10-CM

## 2017-12-21 DIAGNOSIS — M25532 Pain in left wrist: Secondary | ICD-10-CM

## 2017-12-21 DIAGNOSIS — S8264XD Nondisplaced fracture of lateral malleolus of right fibula, subsequent encounter for closed fracture with routine healing: Secondary | ICD-10-CM

## 2017-12-21 DIAGNOSIS — M25571 Pain in right ankle and joints of right foot: Secondary | ICD-10-CM | POA: Diagnosis not present

## 2017-12-21 DIAGNOSIS — S82141D Displaced bicondylar fracture of right tibia, subsequent encounter for closed fracture with routine healing: Secondary | ICD-10-CM
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
## 2017-12-22 ENCOUNTER — Ambulatory Visit: Payer: Medicaid Other

## 2017-12-22 ENCOUNTER — Ambulatory Visit (HOSPITAL_COMMUNITY)
Admission: RE | Admit: 2017-12-22 | Discharge: 2017-12-22 | Disposition: A | Discharge status: Home / Self Care | Payer: Medicaid Other | Source: Ambulatory Visit | Attending: Obstetrics & Gynecology | Admitting: Obstetrics & Gynecology

## 2017-12-22 ENCOUNTER — Encounter (HOSPITAL_COMMUNITY): Payer: Self-pay

## 2017-12-22 ENCOUNTER — Other Ambulatory Visit (HOSPITAL_COMMUNITY): Payer: Self-pay | Admitting: *Deleted

## 2017-12-22 ENCOUNTER — Ambulatory Visit (INDEPENDENT_AMBULATORY_CARE_PROVIDER_SITE_OTHER): Payer: Medicaid Other | Admitting: *Deleted

## 2017-12-22 VITALS — BP 116/64 | HR 101

## 2017-12-22 DIAGNOSIS — O361930 Maternal care for other isoimmunization, third trimester, not applicable or unspecified: Secondary | ICD-10-CM | POA: Insufficient documentation

## 2017-12-22 DIAGNOSIS — O2340 Unspecified infection of urinary tract in pregnancy, unspecified trimester: Secondary | ICD-10-CM

## 2017-12-22 DIAGNOSIS — O0993 Supervision of high risk pregnancy, unspecified, third trimester: Secondary | ICD-10-CM | POA: Diagnosis not present

## 2017-12-22 DIAGNOSIS — O24419 Gestational diabetes mellitus in pregnancy, unspecified control: Secondary | ICD-10-CM

## 2017-12-22 DIAGNOSIS — Z79899 Other long term (current) drug therapy: Secondary | ICD-10-CM | POA: Diagnosis not present

## 2017-12-22 DIAGNOSIS — Z3A34 34 weeks gestation of pregnancy: Secondary | ICD-10-CM | POA: Diagnosis not present

## 2017-12-22 DIAGNOSIS — O99283 Endocrine, nutritional and metabolic diseases complicating pregnancy, third trimester: Secondary | ICD-10-CM

## 2017-12-22 DIAGNOSIS — O99213 Obesity complicating pregnancy, third trimester: Secondary | ICD-10-CM

## 2017-12-22 DIAGNOSIS — O24415 Gestational diabetes mellitus in pregnancy, controlled by oral hypoglycemic drugs: Secondary | ICD-10-CM | POA: Diagnosis not present

## 2017-12-22 DIAGNOSIS — R768 Other specified abnormal immunological findings in serum: Secondary | ICD-10-CM | POA: Insufficient documentation

## 2017-12-22 DIAGNOSIS — O0990 Supervision of high risk pregnancy, unspecified, unspecified trimester: Secondary | ICD-10-CM

## 2017-12-22 DIAGNOSIS — E039 Hypothyroidism, unspecified: Secondary | ICD-10-CM | POA: Insufficient documentation

## 2017-12-22 DIAGNOSIS — Z3490 Encounter for supervision of normal pregnancy, unspecified, unspecified trimester: Secondary | ICD-10-CM

## 2017-12-22 DIAGNOSIS — O321XX0 Maternal care for breech presentation, not applicable or unspecified: Secondary | ICD-10-CM | POA: Diagnosis not present

## 2017-12-22 NOTE — Progress Notes (Signed)
NST is reactive and will see pt next week for NST

## 2017-12-23 ENCOUNTER — Telehealth (INDEPENDENT_AMBULATORY_CARE_PROVIDER_SITE_OTHER): Payer: Self-pay | Admitting: Orthopaedic Surgery

## 2017-12-23 NOTE — Telephone Encounter (Signed)
OK - thanks

## 2017-12-23 NOTE — Telephone Encounter (Signed)
Please advise. [facility]. Ok to send referral to [facility]?

## 2017-12-23 NOTE — Telephone Encounter (Signed)
[facility] know they are unable to see patient due to not having enough staff. Please refer somewhere else.

## 2017-12-24 ENCOUNTER — Encounter (HOSPITAL_COMMUNITY): Payer: Self-pay | Admitting: *Deleted

## 2017-12-24 ENCOUNTER — Telehealth: Payer: Self-pay

## 2017-12-24 ENCOUNTER — Telehealth (HOSPITAL_COMMUNITY): Payer: Self-pay | Admitting: *Deleted

## 2017-12-24 NOTE — Telephone Encounter (Signed)
Patient blood sugars as reported in patient portal    Thursday: 8/8  F: 88  B:110  Lunch 117   Dinner 122    Friday 8/9   F 82  B 104  L:107  D: 117    Saturday 8/10  F: 80  B:109  L:117  D:107     8/11  B 82  Afternoon 115    Was really sick all day didn't eat nor do I check.     8/12   F:82  B:103  Lunch : 117  Dinner: 125    8/13  F: 83  B:100  L:107  D:110    8/14  F:82  B:112  Dinner 119  ( skipped one)

## 2017-12-24 NOTE — Telephone Encounter (Signed)
Preadmission screen  

## 2017-12-24 NOTE — Telephone Encounter (Signed)
Script and [facility]

## 2017-12-26 ENCOUNTER — Encounter (INDEPENDENT_AMBULATORY_CARE_PROVIDER_SITE_OTHER): Payer: Self-pay | Admitting: Orthopaedic Surgery

## 2017-12-27 NOTE — Telephone Encounter (Signed)
Patient sent in My Chart message. Please advise on boot and epidural      Also the hospital wants to know is there any special instructions with the epidural and the boot, and if I should wear the boot during delivery.     Thank you.

## 2017-12-29 ENCOUNTER — Ambulatory Visit (INDEPENDENT_AMBULATORY_CARE_PROVIDER_SITE_OTHER): Payer: Medicaid Other | Admitting: Obstetrics & Gynecology

## 2017-12-29 ENCOUNTER — Other Ambulatory Visit (HOSPITAL_COMMUNITY): Payer: Self-pay | Admitting: Obstetrics and Gynecology

## 2017-12-29 ENCOUNTER — Ambulatory Visit (HOSPITAL_COMMUNITY)
Admission: RE | Admit: 2017-12-29 | Discharge: 2017-12-29 | Disposition: A | Discharge status: Home / Self Care | Payer: Medicaid Other | Source: Ambulatory Visit | Attending: Family Medicine | Admitting: Family Medicine

## 2017-12-29 ENCOUNTER — Encounter (HOSPITAL_COMMUNITY): Payer: Self-pay

## 2017-12-29 ENCOUNTER — Other Ambulatory Visit (HOSPITAL_COMMUNITY)
Admission: RE | Admit: 2017-12-29 | Discharge: 2017-12-29 | Disposition: A | Discharge status: Home / Self Care | Payer: Medicaid Other | Source: Ambulatory Visit | Attending: Obstetrics & Gynecology | Admitting: Obstetrics & Gynecology

## 2017-12-29 VITALS — BP 106/63 | HR 97

## 2017-12-29 DIAGNOSIS — O24419 Gestational diabetes mellitus in pregnancy, unspecified control: Secondary | ICD-10-CM

## 2017-12-29 DIAGNOSIS — O289 Unspecified abnormal findings on antenatal screening of mother: Secondary | ICD-10-CM | POA: Diagnosis not present

## 2017-12-29 DIAGNOSIS — O24415 Gestational diabetes mellitus in pregnancy, controlled by oral hypoglycemic drugs: Secondary | ICD-10-CM

## 2017-12-29 DIAGNOSIS — O99213 Obesity complicating pregnancy, third trimester: Secondary | ICD-10-CM | POA: Diagnosis not present

## 2017-12-29 DIAGNOSIS — O99283 Endocrine, nutritional and metabolic diseases complicating pregnancy, third trimester: Secondary | ICD-10-CM

## 2017-12-29 DIAGNOSIS — O99210 Obesity complicating pregnancy, unspecified trimester: Secondary | ICD-10-CM

## 2017-12-29 DIAGNOSIS — O321XX0 Maternal care for breech presentation, not applicable or unspecified: Secondary | ICD-10-CM | POA: Diagnosis not present

## 2017-12-29 DIAGNOSIS — E039 Hypothyroidism, unspecified: Secondary | ICD-10-CM

## 2017-12-29 DIAGNOSIS — O0993 Supervision of high risk pregnancy, unspecified, third trimester: Secondary | ICD-10-CM

## 2017-12-29 DIAGNOSIS — O361130 Maternal care for Anti-A sensitization, third trimester, not applicable or unspecified: Secondary | ICD-10-CM

## 2017-12-29 DIAGNOSIS — O361190 Maternal care for Anti-A sensitization, unspecified trimester, not applicable or unspecified: Secondary | ICD-10-CM

## 2017-12-29 DIAGNOSIS — O360130 Maternal care for anti-D [Rh] antibodies, third trimester, not applicable or unspecified: Secondary | ICD-10-CM

## 2017-12-29 DIAGNOSIS — O0990 Supervision of high risk pregnancy, unspecified, unspecified trimester: Secondary | ICD-10-CM | POA: Diagnosis present

## 2017-12-29 DIAGNOSIS — O361930 Maternal care for other isoimmunization, third trimester, not applicable or unspecified: Secondary | ICD-10-CM

## 2017-12-29 DIAGNOSIS — Z3A35 35 weeks gestation of pregnancy: Secondary | ICD-10-CM | POA: Insufficient documentation

## 2017-12-29 NOTE — Procedures (Signed)
Dorice LamasSheena G Mckeel 03-26-1986 6458w5d  Fetus A Non-Stress Test Interpretation for 12/29/17  Indication: Gestational Diabetes medication controlled and Unsatisfactory BPP  Fetal Heart Rate A Mode: External Baseline Rate (A): 130 bpm Variability: Moderate Accelerations: 15 x 15 Decelerations: None Multiple birth?: No  Uterine Activity Mode: Toco Contraction Frequency (min): irreg UC noted Contraction Duration (sec): 60-100 Contraction Quality: Mild Resting Tone Palpated: Relaxed Resting Time: Adequate  Interpretation (Fetal Testing) Nonstress Test Interpretation: Reactive Comments: FHR tracing rev'd by Dr. Judeth CornfieldShankar
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
## 2017-12-29 NOTE — Progress Notes (Signed)
gbs

## 2017-12-29 NOTE — Patient Instructions (Signed)

## 2017-12-30 ENCOUNTER — Telehealth (HOSPITAL_COMMUNITY): Payer: Self-pay | Admitting: *Deleted

## 2017-12-30 ENCOUNTER — Encounter (HOSPITAL_COMMUNITY): Payer: Self-pay

## 2017-12-30 NOTE — Telephone Encounter (Signed)
Preadmission screen  

## 2017-12-31 ENCOUNTER — Telehealth (INDEPENDENT_AMBULATORY_CARE_PROVIDER_SITE_OTHER): Payer: Self-pay | Admitting: Radiology

## 2017-12-31 ENCOUNTER — Encounter (HOSPITAL_COMMUNITY): Payer: Self-pay

## 2017-12-31 DIAGNOSIS — S8264XD Nondisplaced fracture of lateral malleolus of right fibula, subsequent encounter for closed fracture with routine healing: Secondary | ICD-10-CM

## 2017-12-31 DIAGNOSIS — S82141D Displaced bicondylar fracture of right tibia, subsequent encounter for closed fracture with routine healing: Secondary | ICD-10-CM

## 2017-12-31 LAB — GC/CHLAMYDIA PROBE AMP (~~LOC~~) NOT AT ARMC
Chlamydia: NEGATIVE
Neisseria Gonorrhea: NEGATIVE

## 2017-12-31 NOTE — Telephone Encounter (Signed)
Please see below. Patient would like to know if she can go to outpatient therapy. She only has a week before she is having her scheduled C-section and hopes to get a few appts in by then.

## 2017-12-31 NOTE — Telephone Encounter (Signed)
Referral entered. Patient advised.  

## 2017-12-31 NOTE — Addendum Note (Signed)
Addended by: Rogers SeedsYEATTS, Shya Kovatch M on: 12/31/2017 12:58 PM   Modules accepted: Orders
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
## 2017-12-31 NOTE — Telephone Encounter (Signed)
Ok thanks 

## 2018-01-03 ENCOUNTER — Other Ambulatory Visit: Payer: Self-pay | Admitting: Obstetrics & Gynecology

## 2018-01-03 MED ORDER — ENOXAPARIN SODIUM 40 MG/0.4ML ~~LOC~~ SOLN: 40.0000 mg | SUBCUTANEOUS | 6 refills | Status: DC

## 2018-01-04 ENCOUNTER — Encounter (INDEPENDENT_AMBULATORY_CARE_PROVIDER_SITE_OTHER): Payer: Self-pay | Admitting: Orthopaedic Surgery

## 2018-01-04 NOTE — Addendum Note (Signed)
Addended by: Willodean RosenthalHARRAWAY-SMITH, Mitra Duling on: 01/04/2018 11:30 AM   Modules accepted: Orders, SmartSet

## 2018-01-05 ENCOUNTER — Encounter: Payer: Medicaid Other | Admitting: Obstetrics & Gynecology

## 2018-01-06 ENCOUNTER — Encounter (HOSPITAL_COMMUNITY)
Admission: RE | Admit: 2018-01-06 | Discharge: 2018-01-06 | Disposition: A | Discharge status: Home / Self Care | Payer: Medicaid Other | Source: Ambulatory Visit | Attending: Obstetrics & Gynecology | Admitting: Obstetrics & Gynecology

## 2018-01-06 DIAGNOSIS — Z01812 Encounter for preprocedural laboratory examination: Secondary | ICD-10-CM | POA: Insufficient documentation

## 2018-01-06 DIAGNOSIS — Z0183 Encounter for blood typing: Secondary | ICD-10-CM | POA: Insufficient documentation

## 2018-01-06 LAB — CBC
HCT: 31.1 % — ABNORMAL LOW (ref 36.0–46.0)
HEMOGLOBIN: 9.2 g/dL — AB (ref 12.0–15.0)
MCH: 22.4 pg — ABNORMAL LOW (ref 26.0–34.0)
MCHC: 29.6 g/dL — ABNORMAL LOW (ref 30.0–36.0)
MCV: 75.7 fL — ABNORMAL LOW (ref 78.0–100.0)
Platelets: 233 10*3/uL (ref 150–400)
RBC: 4.11 MIL/uL (ref 3.87–5.11)
RDW: 18.4 % — ABNORMAL HIGH (ref 11.5–15.5)
WBC: 5.9 10*3/uL (ref 4.0–10.5)

## 2018-01-06 LAB — CULTURE, BETA STREP (GROUP B ONLY): STREP GP B CULTURE: NEGATIVE

## 2018-01-06 NOTE — Patient Instructions (Signed)
Joan LamasSheena G Becker  01/06/2018   Your procedure is scheduled on:  01/07/2018  Enter through the [facility] at 1030 AM.  Pick up the phone at the desk and dial [phone]  Call this number if you have problems the morning of [phone]  Remember:   Do not eat food:(After Midnight) Desps de medianoche.  Do not drink clear liquids: (After Midnight) Desps de medianoche.  Take these medicines the morning of surgery with A SIP OF WATER: take your synthroid the day of surgery.  Do not take lovenox the night before surgery.  Do not take glyburide the morning of surgery.   Do not wear jewelry, make-up or nail polish.  Do not wear lotions, powders, or perfumes. Do not wear deodorant.  Do not shave 48 hours prior to surgery.  Do not bring valuables to the hospital.  [facility] is not   responsible for any belongings or valuables brought to the hospital.  Contacts, dentures or bridgework may not be worn into surgery.  Leave suitcase in the car. After surgery it may be brought to your room.  For patients admitted to the hospital, checkout time is 11:00 AM the day of              discharge.    N/A   Please read over the following fact sheets that you were given:   Surgical Site Infection Prevention

## 2018-01-07 ENCOUNTER — Inpatient Hospital Stay (HOSPITAL_COMMUNITY)
Admission: AD | Admit: 2018-01-07 | Discharge: 2018-01-10 | DRG: 788 | Disposition: A | Discharge status: Home / Self Care | Payer: Medicaid Other | Source: Ambulatory Visit | Attending: Obstetrics & Gynecology | Admitting: Obstetrics & Gynecology

## 2018-01-07 ENCOUNTER — Inpatient Hospital Stay (HOSPITAL_COMMUNITY): Payer: Medicaid Other | Admitting: Certified Registered"

## 2018-01-07 ENCOUNTER — Encounter (HOSPITAL_COMMUNITY)
Admission: AD | Disposition: A | Discharge status: Home / Self Care | Payer: Self-pay | Source: Ambulatory Visit | Attending: Obstetrics & Gynecology

## 2018-01-07 ENCOUNTER — Other Ambulatory Visit: Payer: Self-pay

## 2018-01-07 ENCOUNTER — Encounter (HOSPITAL_COMMUNITY): Payer: Self-pay | Admitting: *Deleted

## 2018-01-07 ENCOUNTER — Inpatient Hospital Stay (HOSPITAL_COMMUNITY)
Admission: RE | Admit: 2018-01-07 | Payer: Medicaid Other | Source: Ambulatory Visit | Attending: Obstetrics and Gynecology | Admitting: Obstetrics and Gynecology

## 2018-01-07 DIAGNOSIS — R768 Other specified abnormal immunological findings in serum: Secondary | ICD-10-CM | POA: Diagnosis present

## 2018-01-07 DIAGNOSIS — O321XX0 Maternal care for breech presentation, not applicable or unspecified: Secondary | ICD-10-CM | POA: Diagnosis present

## 2018-01-07 DIAGNOSIS — O24425 Gestational diabetes mellitus in childbirth, controlled by oral hypoglycemic drugs: Secondary | ICD-10-CM | POA: Diagnosis present

## 2018-01-07 DIAGNOSIS — O24419 Gestational diabetes mellitus in pregnancy, unspecified control: Secondary | ICD-10-CM | POA: Diagnosis present

## 2018-01-07 DIAGNOSIS — O99213 Obesity complicating pregnancy, third trimester: Secondary | ICD-10-CM | POA: Diagnosis present

## 2018-01-07 DIAGNOSIS — O99210 Obesity complicating pregnancy, unspecified trimester: Secondary | ICD-10-CM | POA: Diagnosis present

## 2018-01-07 DIAGNOSIS — Z98891 History of uterine scar from previous surgery: Secondary | ICD-10-CM

## 2018-01-07 DIAGNOSIS — Z3A37 37 weeks gestation of pregnancy: Secondary | ICD-10-CM

## 2018-01-07 DIAGNOSIS — S52135D Nondisplaced fracture of neck of left radius, subsequent encounter for closed fracture with routine healing: Secondary | ICD-10-CM

## 2018-01-07 LAB — CBC
HCT: 31.2 % — ABNORMAL LOW (ref 36.0–46.0)
HEMOGLOBIN: 9.4 g/dL — AB (ref 12.0–15.0)
MCH: 22.9 pg — AB (ref 26.0–34.0)
MCHC: 30.1 g/dL (ref 30.0–36.0)
MCV: 75.9 fL — ABNORMAL LOW (ref 78.0–100.0)
Platelets: 224 10*3/uL (ref 150–400)
RBC: 4.11 MIL/uL (ref 3.87–5.11)
RDW: 18.4 % — ABNORMAL HIGH (ref 11.5–15.5)
WBC: 11.8 10*3/uL — AB (ref 4.0–10.5)

## 2018-01-07 LAB — GLUCOSE, CAPILLARY
GLUCOSE-CAPILLARY: 73 mg/dL (ref 70–99)
Glucose-Capillary: 167 mg/dL — ABNORMAL HIGH (ref 70–99)

## 2018-01-07 LAB — RPR: RPR Ser Ql: NONREACTIVE

## 2018-01-07 LAB — PREPARE RBC (CROSSMATCH)

## 2018-01-07 LAB — CREATININE, SERUM
CREATININE: 0.67 mg/dL (ref 0.44–1.00)
GFR calc Af Amer: >60 mL/min (ref >60)
GFR calc non Af Amer: >60 mL/min (ref >60)

## 2018-01-07 SURGERY — Surgical Case
Anesthesia: Spinal | Wound class: Clean Contaminated

## 2018-01-07 MED ORDER — TETANUS-DIPHTH-ACELL PERTUSSIS 5-2.5-18.5 LF-MCG/0.5 IM SUSP: 0.5000 mL | Freq: Once | INTRAMUSCULAR | Status: DC

## 2018-01-07 MED ORDER — ATROPINE SULFATE 0.4 MG/ML IJ SOLN
INTRAMUSCULAR | Status: AC
  Filled 2018-01-07: qty 1

## 2018-01-07 MED ORDER — ONDANSETRON HCL 4 MG/2ML IJ SOLN
INTRAMUSCULAR | Status: AC
  Filled 2018-01-07: qty 2

## 2018-01-07 MED ORDER — SCOPOLAMINE 1 MG/3DAYS TD PT72
MEDICATED_PATCH | TRANSDERMAL | Status: AC
  Filled 2018-01-07: qty 1

## 2018-01-07 MED ORDER — MORPHINE SULFATE (PF) 0.5 MG/ML IJ SOLN
INTRAMUSCULAR | Status: DC | PRN
  Administered 2018-01-07: 150 ug via EPIDURAL

## 2018-01-07 MED ORDER — SIMETHICONE 80 MG PO CHEW: 80.0000 mg | CHEWABLE_TABLET | ORAL | Status: DC | PRN

## 2018-01-07 MED ORDER — WITCH HAZEL-GLYCERIN EX PADS: 1.0000 "application " | MEDICATED_PAD | CUTANEOUS | Status: DC | PRN

## 2018-01-07 MED ORDER — METHYLERGONOVINE MALEATE 0.2 MG/ML IJ SOLN
INTRAMUSCULAR | Status: DC | PRN
  Administered 2018-01-07: 0.2 mg via INTRAMUSCULAR

## 2018-01-07 MED ORDER — MORPHINE SULFATE (PF) 0.5 MG/ML IJ SOLN
INTRAMUSCULAR | Status: AC
  Filled 2018-01-07: qty 10

## 2018-01-07 MED ORDER — BUPIVACAINE IN DEXTROSE 0.75-8.25 % IT SOLN
INTRATHECAL | Status: DC | PRN
  Administered 2018-01-07: 1.8 mL via INTRATHECAL

## 2018-01-07 MED ORDER — PHENYLEPHRINE 8 MG IN D5W 100 ML (0.08MG/ML) PREMIX OPTIME
INJECTION | INTRAVENOUS | Status: AC
  Filled 2018-01-07: qty 100

## 2018-01-07 MED ORDER — PHENYLEPHRINE 40 MCG/ML (10ML) SYRINGE FOR IV PUSH (FOR BLOOD PRESSURE SUPPORT)
PREFILLED_SYRINGE | INTRAVENOUS | Status: AC
  Filled 2018-01-07: qty 10

## 2018-01-07 MED ORDER — DEXAMETHASONE SODIUM PHOSPHATE 10 MG/ML IJ SOLN
INTRAMUSCULAR | Status: AC
  Filled 2018-01-07: qty 1

## 2018-01-07 MED ORDER — INSULIN ASPART 100 UNIT/ML ~~LOC~~ SOLN: 0.0000 [IU] | Freq: Every day | SUBCUTANEOUS | Status: DC

## 2018-01-07 MED ORDER — SIMETHICONE 80 MG PO CHEW
80.0000 mg | CHEWABLE_TABLET | Freq: Three times a day (TID) | ORAL | Status: DC
  Administered 2018-01-07 – 2018-01-10 (×8): 80 mg via ORAL
  Filled 2018-01-07 (×8): qty 1

## 2018-01-07 MED ORDER — SIMETHICONE 80 MG PO CHEW
80.0000 mg | CHEWABLE_TABLET | ORAL | Status: DC
  Administered 2018-01-08 – 2018-01-09 (×3): 80 mg via ORAL
  Filled 2018-01-07 (×3): qty 1

## 2018-01-07 MED ORDER — INSULIN ASPART 100 UNIT/ML ~~LOC~~ SOLN: 0.0000 [IU] | Freq: Three times a day (TID) | SUBCUTANEOUS | Status: DC

## 2018-01-07 MED ORDER — DEXTROSE 5 % IV SOLN
3.0000 g | INTRAVENOUS | Status: AC
  Administered 2018-01-07: 3 g via INTRAVENOUS
  Filled 2018-01-07: qty 3

## 2018-01-07 MED ORDER — OXYCODONE-ACETAMINOPHEN 5-325 MG PO TABS
1.0000 | ORAL_TABLET | ORAL | Status: DC | PRN
  Administered 2018-01-08 – 2018-01-09 (×6): 1 via ORAL
  Filled 2018-01-07 (×7): qty 1

## 2018-01-07 MED ORDER — GLYCOPYRROLATE 0.2 MG/ML IJ SOLN
INTRAMUSCULAR | Status: DC | PRN
  Administered 2018-01-07: 0.2 mg via INTRAVENOUS

## 2018-01-07 MED ORDER — ACETAMINOPHEN 10 MG/ML IV SOLN: 1000.0000 mg | Freq: Once | INTRAVENOUS | Status: DC | PRN

## 2018-01-07 MED ORDER — LACTATED RINGERS IV SOLN
INTRAVENOUS | Status: DC | PRN
  Administered 2018-01-07: 14:00:00 via INTRAVENOUS

## 2018-01-07 MED ORDER — SOD CITRATE-CITRIC ACID 500-334 MG/5ML PO SOLN
30.0000 mL | ORAL | Status: AC
  Administered 2018-01-07: 30 mL via ORAL
  Filled 2018-01-07: qty 15

## 2018-01-07 MED ORDER — FENTANYL CITRATE (PF) 100 MCG/2ML IJ SOLN
INTRAMUSCULAR | Status: DC | PRN
  Administered 2018-01-07: 15 ug via INTRATHECAL

## 2018-01-07 MED ORDER — EPHEDRINE SULFATE 50 MG/ML IJ SOLN
INTRAMUSCULAR | Status: DC | PRN
  Administered 2018-01-07: 10 mg via INTRAVENOUS
  Administered 2018-01-07: 20 mg via INTRAVENOUS

## 2018-01-07 MED ORDER — FENTANYL CITRATE (PF) 100 MCG/2ML IJ SOLN
INTRAMUSCULAR | Status: AC
  Filled 2018-01-07: qty 2

## 2018-01-07 MED ORDER — OXYTOCIN 10 UNIT/ML IJ SOLN
INTRAMUSCULAR | Status: AC
  Filled 2018-01-07: qty 4

## 2018-01-07 MED ORDER — ONDANSETRON HCL 4 MG/2ML IJ SOLN
INTRAMUSCULAR | Status: DC | PRN
  Administered 2018-01-07: 4 mg via INTRAVENOUS

## 2018-01-07 MED ORDER — LACTATED RINGERS IV SOLN
INTRAVENOUS | Status: DC
  Administered 2018-01-07 (×2): via INTRAVENOUS

## 2018-01-07 MED ORDER — SENNOSIDES-DOCUSATE SODIUM 8.6-50 MG PO TABS
2.0000 | ORAL_TABLET | ORAL | Status: DC
  Administered 2018-01-08 – 2018-01-09 (×3): 2 via ORAL
  Filled 2018-01-07 (×3): qty 2

## 2018-01-07 MED ORDER — SCOPOLAMINE 1 MG/3DAYS TD PT72
MEDICATED_PATCH | TRANSDERMAL | Status: DC | PRN
  Administered 2018-01-07: 1 via TRANSDERMAL

## 2018-01-07 MED ORDER — PHENYLEPHRINE HCL 10 MG/ML IJ SOLN
INTRAMUSCULAR | Status: DC | PRN
  Administered 2018-01-07 (×2): 80 ug via INTRAVENOUS

## 2018-01-07 MED ORDER — MENTHOL 3 MG MT LOZG: 1.0000 | LOZENGE | OROMUCOSAL | Status: DC | PRN

## 2018-01-07 MED ORDER — COCONUT OIL OIL: 1.0000 "application " | TOPICAL_OIL | Status: DC | PRN

## 2018-01-07 MED ORDER — METHYLERGONOVINE MALEATE 0.2 MG/ML IJ SOLN
INTRAMUSCULAR | Status: AC
  Filled 2018-01-07: qty 1

## 2018-01-07 MED ORDER — ACETAMINOPHEN 325 MG PO TABS: 650.0000 mg | ORAL_TABLET | ORAL | Status: DC | PRN

## 2018-01-07 MED ORDER — EPHEDRINE 5 MG/ML INJ
INTRAVENOUS | Status: AC
  Filled 2018-01-07: qty 10

## 2018-01-07 MED ORDER — ZOLPIDEM TARTRATE 5 MG PO TABS: 5.0000 mg | ORAL_TABLET | Freq: Every evening | ORAL | Status: DC | PRN

## 2018-01-07 MED ORDER — OXYTOCIN 10 UNIT/ML IJ SOLN
INTRAVENOUS | Status: DC | PRN
  Administered 2018-01-07: 40 [IU] via INTRAVENOUS

## 2018-01-07 MED ORDER — IBUPROFEN 600 MG PO TABS
600.0000 mg | ORAL_TABLET | Freq: Four times a day (QID) | ORAL | Status: DC
  Administered 2018-01-07 – 2018-01-10 (×12): 600 mg via ORAL
  Filled 2018-01-07 (×12): qty 1

## 2018-01-07 MED ORDER — DIBUCAINE 1 % RE OINT: 1.0000 "application " | TOPICAL_OINTMENT | RECTAL | Status: DC | PRN

## 2018-01-07 MED ORDER — OXYTOCIN 40 UNITS IN LACTATED RINGERS INFUSION - SIMPLE MED: 2.5000 [IU]/h | INTRAVENOUS | Status: AC

## 2018-01-07 MED ORDER — LACTATED RINGERS IV SOLN
INTRAVENOUS | Status: DC
  Administered 2018-01-07: 23:00:00 via INTRAVENOUS

## 2018-01-07 MED ORDER — GLYCOPYRROLATE 0.2 MG/ML IJ SOLN
INTRAMUSCULAR | Status: AC
  Filled 2018-01-07: qty 1

## 2018-01-07 MED ORDER — BUPIVACAINE HCL (PF) 0.5 % IJ SOLN
INTRAMUSCULAR | Status: DC | PRN
  Administered 2018-01-07: 30 mL

## 2018-01-07 MED ORDER — DEXTROSE-NACL 5-0.45 % IV SOLN: INTRAVENOUS | Status: DC

## 2018-01-07 MED ORDER — ENOXAPARIN SODIUM 80 MG/0.8ML ~~LOC~~ SOLN
80.0000 mg | SUBCUTANEOUS | Status: DC
  Administered 2018-01-08 – 2018-01-10 (×3): 80 mg via SUBCUTANEOUS
  Filled 2018-01-07 (×4): qty 0.8

## 2018-01-07 MED ORDER — DEXAMETHASONE SODIUM PHOSPHATE 10 MG/ML IJ SOLN
INTRAMUSCULAR | Status: DC | PRN
  Administered 2018-01-07: 8 mg via INTRAVENOUS

## 2018-01-07 MED ORDER — OXYCODONE-ACETAMINOPHEN 5-325 MG PO TABS
2.0000 | ORAL_TABLET | ORAL | Status: DC | PRN
  Administered 2018-01-09 – 2018-01-10 (×6): 2 via ORAL
  Filled 2018-01-07 (×6): qty 2

## 2018-01-07 MED ORDER — ATROPINE SULFATE 0.4 MG/ML IJ SOLN
INTRAMUSCULAR | Status: DC | PRN
  Administered 2018-01-07: .1 mg via INTRAVENOUS

## 2018-01-07 MED ORDER — PHENYLEPHRINE 8 MG IN D5W 100 ML (0.08MG/ML) PREMIX OPTIME
INJECTION | INTRAVENOUS | Status: DC | PRN
  Administered 2018-01-07: 60 ug/min via INTRAVENOUS

## 2018-01-07 MED ORDER — BUPIVACAINE HCL (PF) 0.5 % IJ SOLN
INTRAMUSCULAR | Status: AC
  Filled 2018-01-07: qty 30

## 2018-01-07 MED ORDER — DIPHENHYDRAMINE HCL 25 MG PO CAPS: 25.0000 mg | ORAL_CAPSULE | Freq: Four times a day (QID) | ORAL | Status: DC | PRN

## 2018-01-07 MED ORDER — PRENATAL MULTIVITAMIN CH
1.0000 | ORAL_TABLET | Freq: Every day | ORAL | Status: DC
  Administered 2018-01-08 – 2018-01-10 (×3): 1 via ORAL
  Filled 2018-01-07 (×3): qty 1

## 2018-01-07 SURGICAL SUPPLY — 33 items
BARRIER ADHS 3X4 INTERCEED (GAUZE/BANDAGES/DRESSINGS) IMPLANT
BRR ADH 4X3 ABS CNTRL BYND (GAUZE/BANDAGES/DRESSINGS)
CHLORAPREP W/TINT 26ML (MISCELLANEOUS) ×3 IMPLANT
CLAMP CORD UMBIL (MISCELLANEOUS) IMPLANT
CLOTH BEACON ORANGE TIMEOUT ST (SAFETY) ×3 IMPLANT
DRSG OPSITE POSTOP 4X10 (GAUZE/BANDAGES/DRESSINGS) ×3 IMPLANT
ELECT REM PT RETURN 9FT ADLT (ELECTROSURGICAL) ×3
ELECTRODE REM PT RTRN 9FT ADLT (ELECTROSURGICAL) ×1 IMPLANT
EXTRACTOR VACUUM KIWI (MISCELLANEOUS) IMPLANT
GLOVE BIO SURGEON STRL SZ 6.5 (GLOVE) ×2 IMPLANT
GLOVE BIO SURGEONS STRL SZ 6.5 (GLOVE) ×1
GLOVE BIOGEL PI IND STRL 7.0 (GLOVE) ×2 IMPLANT
GLOVE BIOGEL PI INDICATOR 7.0 (GLOVE) ×4
GOWN STRL REUS W/TWL LRG LVL3 (GOWN DISPOSABLE) ×6 IMPLANT
KIT ABG SYR 3ML LUER SLIP (SYRINGE) IMPLANT
NDL HYPO 25X5/8 SAFETYGLIDE (NEEDLE) IMPLANT
NEEDLE HYPO 22GX1.5 SAFETY (NEEDLE) IMPLANT
NEEDLE HYPO 25X5/8 SAFETYGLIDE (NEEDLE) IMPLANT
NS IRRIG 1000ML POUR BTL (IV SOLUTION) ×3 IMPLANT
PACK C SECTION WH (CUSTOM PROCEDURE TRAY) ×3 IMPLANT
PAD ABD 7.5X8 STRL (GAUZE/BANDAGES/DRESSINGS) ×2 IMPLANT
PAD OB MATERNITY 4.3X12.25 (PERSONAL CARE ITEMS) ×3 IMPLANT
PENCIL SMOKE EVAC W/HOLSTER (ELECTROSURGICAL) ×3 IMPLANT
RETRACTOR WND ALEXIS 25 LRG (MISCELLANEOUS) IMPLANT
RTRCTR WOUND ALEXIS 25CM LRG (MISCELLANEOUS)
SPONGE GAUZE 4X4 FOR O.R. (GAUZE/BANDAGES/DRESSINGS) ×4 IMPLANT
SUT VIC AB 0 CT1 36 (SUTURE) ×18 IMPLANT
SUT VIC AB 2-0 CT1 27 (SUTURE) ×3
SUT VIC AB 2-0 CT1 TAPERPNT 27 (SUTURE) ×1 IMPLANT
SUT VIC AB 4-0 PS2 27 (SUTURE) ×3 IMPLANT
SYR CONTROL 10ML LL (SYRINGE) IMPLANT
TOWEL OR 17X24 6PK STRL BLUE (TOWEL DISPOSABLE) ×3 IMPLANT
TRAY FOLEY W/BAG SLVR 14FR LF (SET/KITS/TRAYS/PACK) IMPLANT

## 2018-01-07 NOTE — Anesthesia Postprocedure Evaluation (Signed)
Anesthesia Post Note  Patient: Dorice LamasSheena G Ronan  Procedure(s) Performed: CESAREAN SECTION (N/A )     Patient location during evaluation: Mother Baby Anesthesia Type: Spinal Level of consciousness: awake and alert and oriented Pain management: satisfactory to patient Vital Signs Assessment: post-procedure vital signs reviewed and stable Respiratory status: spontaneous breathing and nonlabored ventilation Cardiovascular status: stable Postop Assessment: no headache, no backache, patient able to bend at knees, no signs of nausea or vomiting and adequate PO intake Anesthetic complications: no    Last Vitals:  Vitals:   01/07/18 1638 01/07/18 1800  BP: 133/74 129/66  Pulse: 66 71  Resp: 18   Temp: 37 C 36.6 C  SpO2: 98% 98%    Last Pain:  Vitals:   01/07/18 1802  TempSrc:   PainSc: 0-No pain   Pain Goal: Patients Stated Pain Goal: 6 (01/07/18 1132)               Madison HickmanGREGORY,Salah Burlison

## 2018-01-07 NOTE — Progress Notes (Signed)
CBG in PACU 83

## 2018-01-07 NOTE — Addendum Note (Signed)
Addendum  created 01/07/18 1828 by Shanon PayorGregory, Burdell Peed M, CRNA   Sign clinical note

## 2018-01-07 NOTE — Anesthesia Procedure Notes (Signed)
Spinal  Patient location during procedure: OR Start time: 01/07/2018 1:23 PM End time: 01/07/2018 1:33 PM Staffing Anesthesiologist: Elmer PickerWoodrum, Karlissa Aron L, MD Performed: anesthesiologist  Preanesthetic Checklist Completed: patient identified, surgical consent, pre-op evaluation, timeout performed, IV checked, risks and benefits discussed and monitors and equipment checked Spinal Block Patient position: sitting Prep: site prepped and draped and DuraPrep Patient monitoring: cardiac monitor, continuous pulse ox and blood pressure Approach: midline Location: L3-4 Injection technique: single-shot Needle Needle type: Pencan  Needle gauge: 24 G Needle length: 9 cm Assessment Sensory level: T6 Additional Notes Functioning IV was confirmed and monitors were applied. Sterile prep and drape, including hand hygiene and sterile gloves were used. The patient was positioned and the spine was prepped. The skin was anesthetized with lidocaine.  Free flow of clear CSF was obtained prior to injecting local anesthetic into the CSF.  The spinal needle aspirated freely following injection.  The needle was carefully withdrawn.  The patient tolerated the procedure well.

## 2018-01-07 NOTE — Transfer of Care (Signed)
Immediate Anesthesia Transfer of Care Note  Patient: Joan LamasSheena G Becker  Procedure(s) Performed: CESAREAN SECTION (N/A )  Patient Location: PACU  Anesthesia Type:Spinal  Level of Consciousness: awake, alert  and oriented  Airway & Oxygen Therapy: Patient Spontanous Breathing  Post-op Assessment: Report given to RN and Post -op Vital signs reviewed and stable  Post vital signs: Reviewed and stable  Last Vitals:  Vitals Value Taken Time  BP    Temp    Pulse    Resp    SpO2      Last Pain:  Vitals:   01/07/18 1132  TempSrc: Oral  PainSc: 0-No pain      Patients Stated Pain Goal: 6 (01/07/18 1132)  Complications: No apparent anesthesia complications

## 2018-01-07 NOTE — Op Note (Signed)
Joan Becker PROCEDURE DATE: 01/07/2018  PREOPERATIVE DIAGNOSES: Intrauterine pregnancy at [redacted]w[redacted]d weeks gestation; breech presentation and isoimmunization. Morbid obesity  POSTOPERATIVE DIAGNOSES: The same  PROCEDURE: Primary Low Transverse Cesarean Section  SURGEON:  Adam Phenix, MD  ASSISTANT:  none  ANESTHESIOLOGY TEAM: Anesthesiologist: Elmer Picker, MD CRNA: Rica Records, CRNA  INDICATIONS: Joan Becker is a 32 y.o. G2P1001 at [redacted]w[redacted]d here for cesarean section secondary to the indications listed under preoperative diagnoses; please see preoperative note for further details.  The risks of cesarean section were discussed with the patient including but were not limited to: bleeding which may require transfusion or reoperation; infection which may require antibiotics; injury to bowel, bladder, ureters or other surrounding organs; injury to the fetus; need for additional procedures including hysterectomy in the event of a life-threatening hemorrhage; placental abnormalities wth subsequent pregnancies, incisional problems, thromboembolic phenomenon and other postoperative/anesthesia complications.   The patient concurred with the proposed plan, giving informed written consent for the procedure.    FINDINGS:  Viable female infant in breech presentation.  Apgars 9 and 9.  Clear amniotic fluid.  Intact placenta, three vessel cord.  Normal uterus, fallopian tubes and ovaries bilaterally.  ANESTHESIA: Spinal  INTRAVENOUS FLUIDS: 2000 ml   ESTIMATED BLOOD LOSS: 984 ml URINE OUTPUT:  100 ml SPECIMENS: Placenta sent to L&D COMPLICATIONS: None immediate  PROCEDURE IN DETAIL:  The patient preoperatively received intravenous antibiotics and had sequential compression devices applied to her lower extremities.  She was then taken to the operating room where spinal anesthesia was administered and was found to be adequate. She was then placed in a dorsal supine position with a leftward  tilt, and prepped and draped in a sterile manner.  A foley catheter was placed into her bladder and attached to constant gravity.  After an adequate timeout was performed, a Pfannenstiel skin incision was made with scalpel and carried through to the underlying layer of fascia. The fascia was incised in the midline, and this incision was extended bilaterally using the Mayo scissors.  Kocher clamps were applied to the superior aspect of the fascial incision and the underlying rectus muscles were dissected off bluntly.  A similar process was carried out on the inferior aspect of the fascial incision. The rectus muscles were separated in the midline and the peritoneum was entered bluntly. The Alexis self-retaining retractor was introduced into the abdominal cavity.  Attention was turned to the lower uterine segment where a low transverse hysterotomy was made with a scalpel and extended bilaterally bluntly.  The infant was successfully delivered breech, the cord was clamped and cut after one minute, and the infant was handed over to the awaiting neonatology team. Uterine massage was then administered, and the placenta delivered intact with a three-vessel cord. The uterus was then cleared of clots and debris.  The hysterotomy was closed with 0 Vicryl in a running locked fashion, and an imbricating layer was also placed with 0 Vicryl.  Figure-of-eight 0 Vicryl serosal stitches were placed to help with hemostasis.  The pelvis was cleared of all clot and debris. Hemostasis was confirmed on all surfaces.  The retractor was removed.  The peritoneum was closed with a 2-0 Vicryl running stitch  The fascia was then closed using 0 Vicryl.  The subcutaneous layer was irrigated, reapproximated with 2-0 plain gut interrupted stitches, and the skin was closed with a 4-0 Vicryl subcuticular stitch. The patient tolerated the procedure well. Sponge, instrument and needle counts were correct x 3.  She was taken to the recovery room in  stable condition.    Loni Delbridge G, MD Obstetrician & Gynecologist, [facility], [facility]

## 2018-01-07 NOTE — H&P (Signed)
Joan LamasSheena G Becker is a 32 y.o. female presenting for primary cesarean section 6740w0d Isoimmunization in antepartum period, breech presentation . OB History    Gravida  2   Para  1   Term  1   Preterm  0   AB  0   Living  1     SAB  0   TAB  0   Ectopic  0   Multiple      Live Births  1          Past Medical History:  Diagnosis Date  . Abdominal pain in pregnancy, antepartum 06/06/2014  . Gestational diabetes    glyburide  . Hypothyroidism   . Kidney stones   . Obesity   . PCOS (polycystic ovarian syndrome)   . Postpartum care following vaginal delivery (4/9) 08/19/2014  . Thyroid disease    Past Surgical History:  Procedure Laterality Date  . ADENOIDECTOMY    . DILATION AND CURETTAGE OF UTERUS    . HYSTEROSCOPY W/D&C N/A 08/11/2012   Procedure: DILATATION AND CURETTAGE /HYSTEROSCOPY;  Surgeon: Adam PhenixJames G Aimar Borghi, MD;  Location: [facility];  Service: Gynecology;  Laterality: N/A;  . MYRINGOTOMY    . ORIF TIBIA PLATEAU Right 10/26/2017   Procedure: OPEN REDUCTION INTERNAL FIXATION (ORIF) TIBIAL PLATEAU, MEDIAL AND LATERAL CONDYLE FIXATION, RIGHT ANKLE SPLINT APPLICATION;  Surgeon: Eldred MangesYates, Mark C, MD;  Location: [facility];  Service: Orthopedics;  Laterality: Right;  . TONSILLECTOMY    . TYMPANOSTOMY TUBE PLACEMENT    . WISDOM TOOTH EXTRACTION     Family History: family history includes Diabetes in her father; Hypertension in her father. Social History:  reports that she has never smoked. She has never used smokeless tobacco. She reports that she drank alcohol. She reports that she does not use drugs.     Maternal Diabetes: Yes:  Diabetes Type:  Pre-pregnancy Genetic Screening: Normal Maternal Ultrasounds/Referrals: Normal Fetal Ultrasounds or other Referrals:  Referred to Materal Fetal Medicine , Other: nl MCA dopplers, LGA Maternal Substance Abuse:  No Significant Maternal Medications:  Meds include: Other: glyburide, synthroid Significant Maternal Lab Results:   None Other Comments:  None  ROS History   Blood pressure 137/76, pulse 81, temperature 97.8 F (36.6 C), temperature source Oral, resp. rate (!) 24, height 5\' 7"  (1.702 m), weight (!) 154.2 kg, last menstrual period 04/02/2017, unknown if currently breastfeeding. Maternal Exam:  Abdomen: Patient reports no abdominal tenderness. Fetal presentation: breech US at bedside  Introitus: not evaluated.   Cervix: not evaluated.   Physical Exam  Constitutional: She is oriented to person, place, and time. She appears well-developed. No distress.  HENT:  Head: Normocephalic.  Eyes: Pupils are equal, round, and reactive to light.  Neck: Normal range of motion.  Cardiovascular: Normal rate.  Respiratory: Effort normal. No respiratory distress.  GI:  Obese with large pannus  Neurological: She is alert and oriented to person, place, and time.  Psychiatric: She has a normal mood and affect. Her behavior is normal.    Prenatal labs: ABO, Rh: --/--/A POS (08/29 1035) Antibody: POS (08/29 1035) Rubella: 1.50 (02/15 0940) RPR: Non Reactive (08/29 1035)  HBsAg: Negative (02/15 0940)  HIV: Non Reactive (06/19 0322)  GBS:   negative  Assessment/Plan: US confirms breech, declines to have ECV per her discussion with Dr Erin FullingHarraway-Smith The risks of cesarean section discussed with the patient included but were not limited to: bleeding which may require transfusion or reoperation; infection which may require antibiotics; injury to  bowel, bladder, ureters or other surrounding organs; injury to the fetus; need for additional procedures including hysterectomy in the event of a life-threatening hemorrhage; placental abnormalities wth subsequent pregnancies, incisional problems, thromboembolic phenomenon and other postoperative/anesthesia complications. The patient concurred with the proposed plan, giving informed written consent for the procedure.   Patient has been NPO since 0000 she will remain NPO for  procedure. Anesthesia and OR aware. Preoperative prophylactic antibiotics and SCDs ordered on call to the OR.  To OR when ready.     Scheryl Darter 01/07/2018, 1:08 PM

## 2018-01-07 NOTE — Anesthesia Preprocedure Evaluation (Addendum)
Anesthesia Evaluation  Patient identified by MRN, date of birth, ID band Patient awake    Reviewed: Allergy & Precautions, NPO status , Patient's Chart, lab work & pertinent test results  Airway Mallampati: II  TM Distance: >3 FB Neck ROM: Full    Dental no notable dental hx. (+) Teeth Intact, Dental Advisory Given   Pulmonary    Pulmonary exam normal breath sounds clear to auscultation       Cardiovascular Normal cardiovascular exam Rhythm:Regular Rate:Normal     Neuro/Psych negative neurological ROS  negative psych ROS   GI/Hepatic negative GI ROS, Neg liver ROS,   Endo/Other  negative endocrine ROSdiabetes, GestationalHypothyroidism Morbid obesity  Renal/GU negative Renal ROS  negative genitourinary   Musculoskeletal negative musculoskeletal ROS (+)   Abdominal   Peds  Hematology negative hematology ROS (+)   Anesthesia Other Findings   Reproductive/Obstetrics                            Anesthesia Physical Anesthesia Plan  ASA: III  Anesthesia Plan: Spinal   Post-op Pain Management:    Induction: Intravenous  PONV Risk Score and Plan: 2 and Treatment may vary due to age or medical condition  Airway Management Planned: Natural Airway  Additional Equipment:   Intra-op Plan:   Post-operative Plan:   Informed Consent: I have reviewed the patients History and Physical, chart, labs and discussed the procedure including the risks, benefits and alternatives for the proposed anesthesia with the patient or authorized representative who has indicated his/her understanding and acceptance.   Dental advisory given  Plan Discussed with: CRNA  Anesthesia Plan Comments:         Anesthesia Quick Evaluation

## 2018-01-07 NOTE — Anesthesia Postprocedure Evaluation (Signed)
Anesthesia Post Note  Patient: Dorice LamasSheena G Mickel  Procedure(s) Performed: CESAREAN SECTION (N/A )     Patient location during evaluation: PACU Anesthesia Type: Spinal Level of consciousness: oriented and awake and alert Pain management: pain level controlled Vital Signs Assessment: post-procedure vital signs reviewed and stable Respiratory status: spontaneous breathing, respiratory function stable and patient connected to nasal cannula oxygen Cardiovascular status: blood pressure returned to baseline and stable Postop Assessment: no headache, no backache and no apparent nausea or vomiting Anesthetic complications: no    Last Vitals:  Vitals:   01/07/18 1615 01/07/18 1638  BP: 121/63 133/74  Pulse: 68 66  Resp: 16 18  Temp: 36.6 C 37 C  SpO2: 99% 98%    Last Pain:  Vitals:   01/07/18 1638  TempSrc: Oral  PainSc: 0-No pain   Pain Goal: Patients Stated Pain Goal: 6 (01/07/18 1132)               Lydia Toren L Bentlie Catanzaro

## 2018-01-08 LAB — CBC
HEMATOCRIT: 25.3 % — AB (ref 36.0–46.0)
HEMOGLOBIN: 7.7 g/dL — AB (ref 12.0–15.0)
MCH: 23 pg — ABNORMAL LOW (ref 26.0–34.0)
MCHC: 30.4 g/dL (ref 30.0–36.0)
MCV: 75.5 fL — ABNORMAL LOW (ref 78.0–100.0)
Platelets: 200 10*3/uL (ref 150–400)
RBC: 3.35 MIL/uL — AB (ref 3.87–5.11)
RDW: 18.6 % — ABNORMAL HIGH (ref 11.5–15.5)
WBC: 10.9 10*3/uL — ABNORMAL HIGH (ref 4.0–10.5)

## 2018-01-08 LAB — GLUCOSE, CAPILLARY
GLUCOSE-CAPILLARY: 97 mg/dL (ref 70–99)
GLUCOSE-CAPILLARY: 75 mg/dL (ref 70–99)
GLUCOSE-CAPILLARY: 94 mg/dL (ref 70–99)

## 2018-01-08 MED ORDER — FERROUS FUMARATE 324 (106 FE) MG PO TABS
1.0000 | ORAL_TABLET | Freq: Two times a day (BID) | ORAL | Status: DC
  Administered 2018-01-08 – 2018-01-10 (×5): 106 mg via ORAL
  Filled 2018-01-08 (×7): qty 1

## 2018-01-08 NOTE — Lactation Note (Addendum)
This note was copied from a baby's chart. Lactation Consultation Note  Patient Name: Joan Merrilee JanskySheena Audi ZOXWR'UToday's Date: 01/08/2018 Reason for consult: Follow-up assessment;Difficult latch;Early term 2337-38.6wks  P2 mother whose infant is now 4127 hours old.  Mother pumping with the DEBP as I entered her room.  Baby is under double phototherapy.  Bilirubin is 8.4 mg/dl at [id number] (23 hours)  Pediatrician has spoken with parents and wants mother to continue to work on feeding baby.  Mother was able to obtain 1 ml of EBM which father syringe fed back to her.  Mother does not want to supplement at this time, however, if it becomes necessary to supplement per MD orders, mother is in agreement to give formula.  I reviewed the supplementation volume of 7-12 mls after every feeding and mother verbalized understanding.  I encouraged pumping with the DEBP every 3 hours and to combine this with hand expression before/after feedings.  Mother reported that her nipples are flat.  At the current time the nipples are large and wide but are quite erect after pumping.  Mother feels like they will begin to invert "like they always do."  Therefore, I provided breast shells and a manual pump with instructions for use to help prevent this.  Mother immediately began using the breast shells and will use the hand pump before every feeding to help evert nipples.  Flange size #27 that she was using is too small so I fitted her with a #30 for both the DEBP and the hand pump.  Mother stated these were more comfortable.    Mother will call for latch assistance at the next feeding if needed.  RN updated. Father present and supportive.    Maternal Data Formula Feeding for Exclusion: No Has patient been taught Hand Expression?: Yes Does the patient have breastfeeding experience prior to this delivery?: Yes  Feeding Feeding Type: Breast Milk Length of feed: 1 min  LATCH Score                   Interventions     Lactation Tools Discussed/Used Initiated by:: Already set up in room   Consult Status Consult Status: Follow-up Date: 01/09/18 Follow-up type: In-patient    Dora SimsBeth R Eldin Bonsell 01/08/2018, 5:38 PM

## 2018-01-08 NOTE — Lactation Note (Signed)
This note was copied from a baby's chart. Lactation Consultation Note  Patient Name: Girl Merrilee JanskySheena Arora ZOXWR'UToday's Date: 01/08/2018 Reason for consult: Follow-up assessment;Early term 37-38.6wks;Infant weight loss;Other (Comment);Hyperbilirubinemia(DAT (+))  4433 hours old female who is now being partially BF and formula fed by her mother, she's a P2. Mom was pumping when entering the room, baby started double phototherapy today she's DAT (+). Mom frustrated because she can't get any drops of colostrum in this pumping sessions, explain to mom that the purpose of pumping at this point is manly for breast stimulation and that any amount of colostrum or "drops" we get is just a little extra, encouraged her to keep pumping.  Explained to parents the pros and cons of formula supplementation and that in this particular case that the pros outweigh the cons, baby will start getting supplemented with Similac 19 calorie formula, the instrument for supplementation will be a curved tip syringe; dad already familiar with syringe feeding.  Encouraged mom to keep putting baby to the breast STS 8-12 times/24 hours or sooner if feeding cues are present. Discussed cluster feeding. She'll continue pumping every 3 hours during the day and at least once at night. Feeding plan will be revised again tomorrow when baby turns 48 hours old, parents will follow amounts for supplementation according to chart. Both parents aware of LC services and will call PRN.  Maternal Data    Feeding Feeding Type: Breast Fed Length of feed: 5 min  Interventions Interventions: Breast feeding basics reviewed;DEBP  Lactation Tools Discussed/Used     Consult Status Consult Status: Follow-up Date: 01/09/18 Follow-up type: In-patient    Uilani Sanville Venetia ConstableS Marsia Cino 01/08/2018, 11:30 PM
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
## 2018-01-08 NOTE — Lactation Note (Signed)
This note was copied from a baby's chart. Lactation Consultation Note  Patient Name: Joan Merrilee JanskySheena Becker ZOXWR'UToday's Date: 01/08/2018 Reason for consult: Follow-up assessment;Early term 6337-38.6wks  P2 mother whose infant is now 4422 hours old.  Mother has visitors in the room.  She stated she is not having any problems and is breastfeeding and supplementing.  This morning she tried using the syringe one time.  I reviewed the correct volumes for supplementation and reminded her she can increase to 7-12 mls at 24 hours of age.  Mother verbalized understanding.    I offered to assist with latching the next time she shows feeding cues.  Mother will call if needed.  I did not assess breasts and nipples due to visitors being present but asked her to call me when they leave and I can give her some breast shells if needed.  She will plan to call me.  She did tell me that she is not interested in using a NS at all with this child.  I acknowledged her request.         Maternal Data Formula Feeding for Exclusion: No Has patient been taught Hand Expression?: Yes Does the patient have breastfeeding experience prior to this delivery?: Yes  Feeding Feeding Type: Breast Milk  LATCH Score                   Interventions    Lactation Tools Discussed/Used     Consult Status Consult Status: Follow-up Date: 01/09/18 Follow-up type: In-patient    Zeki Bedrosian R Rohail Klees 01/08/2018, 12:21 PM

## 2018-01-09 NOTE — Lactation Note (Signed)
This note was copied from a baby's chart. Lactation Consultation Note  Patient Name: Joan Becker [id number] Date: 01/09/2018 Reason for consult: Follow-up assessment;Early term 37-38.6wks;Difficult latch  P2 mother whose infant is now 29 hours old.  Mother requested latch assistance.  Mother's nipples continue to be everted with pre pumping with manual pump or DEBP but invert when baby latches.  Asked father to help pre-pump mother to get as much nipple eversion as possible while I let baby suck on my gloved finger for comfort.    Assisted mother to latch in the cross cradle hold on the left side after repeated attempts to keep nipple in mouth.  Baby into breast tissue deeply and encouraged mother to keep her tight and perform breast compressions while feeding.  Once infant was able to latch and hold the latch many audible swallows were heard.  She sucked hard and strong for 5 minutes before self releasing.  Mother's nipples had colostrum flowing out.  Baby was calm and quiet.  Placed her back under phototherapy while mother pumped with the DEBP.  While I remained in the room for a few more minutes there was a greater amount of colostrum from both breasts.  Per mom, the left breast always produces more colostrum.  I continued to reinforce pumping with the DEBP every time after baby breastfeeds.  Mother will attempt to latch on the right breast first with the next feeding.  She will call for latch assistance as needed.  Father will feed the EBM back with the curved tip syringe after mother finishes pumping.  Volumes will increase after the next feeding session and reviewed again with the father.  He understands and has supplementation guideline in his hands.  RN updated.   Maternal Data Formula Feeding for Exclusion: No Has patient been taught Hand Expression?: Yes Does the patient have breastfeeding experience prior to this delivery?: Yes  Feeding Feeding Type: Breast Fed Length of feed: 5  min  LATCH Score Latch: Repeated attempts needed to sustain latch, nipple held in mouth throughout feeding, stimulation needed to elicit sucking reflex.  Audible Swallowing: Spontaneous and intermittent  Type of Nipple: Everted at rest and after stimulation(short shafted; pre pump but nipples invert)  Comfort (Breast/Nipple): Soft / non-tender  Hold (Positioning): Assistance needed to correctly position infant at breast and maintain latch.  LATCH Score: 8  Interventions Interventions: Breast feeding basics reviewed;Assisted with latch;Skin to skin;Breast massage;Hand express;Pre-pump if needed;Position options;Support pillows;Adjust position;Breast compression;Shells;DEBP  Lactation Tools Discussed/Used Tools: Shells;Pump;Flanges;Coconut oil Flange Size: 30 Shell Type: Inverted Breast pump type: Double-Electric Breast Pump   Consult Status Consult Status: Follow-up Date: 01/10/18 Follow-up type: In-patient    Dora Sims 01/09/2018, 12:31 PM

## 2018-01-09 NOTE — Lactation Note (Addendum)
This note was copied from a baby's chart. Lactation Consultation Note  Patient Name: Joan Becker [id number] Date: 01/09/2018 Reason for consult: Follow-up assessment;Hyperbilirubinemia;Infant weight loss  P2, female infant, 53 hours, ETI w/ weight loss 8%,  Infant is currently on phototherapy.  Per mom, infant is latching better and she is not having to use NS. LC entered room, per mom, she just finished BF infant for  20 minutes. No latch was observed by LC at this time. Mom's  plan is to  give infant EBM, then supplement w/ formula remaining requirement for age (hours of birth/ feeding 18-25 ml EBM / or formula). Mom is feeling more confident w/ BF and feels BF is going well, will call LC if need assistance w/ BF or if she has any questions or concerns.   Maternal Data    Feeding    LATCH Score                   Interventions    Lactation Tools Discussed/Used     Consult Status Consult Status: Follow-up Date: 01/10/18 Follow-up type: In-patient    Danelle Earthly 01/09/2018, 8:00 PM

## 2018-01-09 NOTE — Progress Notes (Signed)
Subjective: Postpartum Day 2: Cesarean Delivery Patient reports incisional pain, tolerating PO and + flatus.    Objective: Vital signs in last 24 hours: Temp:  [97.5 F (36.4 C)-97.7 F (36.5 C)] 97.7 F (36.5 C) (09/01 0508) Pulse Rate:  [72-79] 72 (09/01 0508) Resp:  [18-20] 20 (09/01 0508) BP: (123-136)/(69-77) 131/77 (09/01 0508) SpO2:  [97 %-99 %] 98 % (09/01 0508)  Physical Exam:  General: alert, cooperative, appears stated age and no distress Lochia: appropriate Uterine Fundus: firm Incision: healing well, no significant drainage, no dehiscence, no significant erythema DVT Evaluation: No evidence of DVT seen on physical exam.  Recent Labs    01/07/18 1721 01/08/18 0554  HGB 9.4* 7.7*  HCT 31.2* 25.3*    Assessment/Plan: Status post Cesarean section. Doing well postoperatively.  Continue current care.  Wyvonnia Dusky 01/09/2018, 10:47 AM

## 2018-01-09 NOTE — Lactation Note (Signed)
This note was copied from a baby's chart. Lactation Consultation Note  Patient Name: Joan Becker HERDE'Y Date: 01/09/2018 Reason for consult: Follow-up assessment;Early term 68-38.6wks  P2 mother whose infant is now 61 hours old.  Baby remains on phototherapy today with an a.m. Bilirubin level of 8.6mg /dl.  Mother was holding baby on her chest as I arrived.  Baby was not showing any feeding cues.  Mother continues to breastfeed baby every 3 hours or sooner if she shows feeding cues and is supplementing after with formula.  I have noticed that she is not supplementing every time and reviewed the volume guidelines with parents.  Reminded them that baby should be supplementing with 7-12 mls now and at 48 hours of life the volume will increase to 18-25.  Parents verbalized understanding.  Father feeds the supplementation with a curved tip syringe and likes this method.  Mother is continuing to pump using the DEBP every 3 hours after feedings.  She is not wearing her breast shells right now but states she will use them soon when she gets dressed.  I offered to assist with latching at the next feeding and mother accepted.  I will return at 11:45 a.m. Unless mother calls earlier.  I would like to be sure baby is latching appropriately.    Maternal Data Formula Feeding for Exclusion: No Has patient been taught Hand Expression?: Yes Does the patient have breastfeeding experience prior to this delivery?: Yes  Feeding    LATCH Score                   Interventions    Lactation Tools Discussed/Used     Consult Status Consult Status: Follow-up Date: 01/10/18 Follow-up type: In-patient    Belmira Daley R Marque Bango 01/09/2018, 10:46 AM

## 2018-01-09 NOTE — Progress Notes (Signed)
Pt wants to keep her foley in this morning.  She states that she is going to talk to the doctor about it when he comes in to see her.  Will report off to on-coming shift.

## 2018-01-10 ENCOUNTER — Ambulatory Visit: Payer: Self-pay

## 2018-01-10 LAB — BPAM RBC
Blood Product Expiration Date: 201909172359
Blood Product Expiration Date: 201909272359
Unit Type and Rh: 600
Unit Type and Rh: 6200

## 2018-01-10 LAB — TYPE AND SCREEN
ABO/RH(D): A POS
ANTIBODY SCREEN: POSITIVE
PT AG TYPE: NEGATIVE
Unit division: 0
Unit division: 0

## 2018-01-10 MED ORDER — IBUPROFEN 600 MG PO TABS: 600.0000 mg | ORAL_TABLET | Freq: Four times a day (QID) | ORAL | 0 refills | Status: DC

## 2018-01-10 MED ORDER — OXYCODONE-ACETAMINOPHEN 5-325 MG PO TABS: 2.0000 | ORAL_TABLET | Freq: Four times a day (QID) | ORAL | 0 refills | Status: DC | PRN

## 2018-01-10 MED ORDER — FERROUS FUMARATE 324 (106 FE) MG PO TABS: 1.0000 | ORAL_TABLET | Freq: Two times a day (BID) | ORAL | 1 refills | Status: DC

## 2018-01-10 NOTE — Lactation Note (Signed)
This note was copied from a baby's chart. Lactation Consultation Note  Patient Name: Girl Dealva Matlick LXBWI'O Date: 01/10/2018 Reason for consult: Follow-up assessment;Early term 37-38.6wks;Hyperbilirubinemia Type of Endocrine Disorder?: Diabetes  P2 mother whose infant is now 87 hours old.  Father requested assistance with the foley cup for feeding.  As discussed this morning on first round with family, father will try the foley cup for feeding baby as an alternate method.  He has been using the curved tip syringe up to this point.  With the increased volumes I offered to show him how to supplement and he was willing to learn.  Mother does not want to use an artificial nipple to feed.  Mother had 20 mls of EBM that she pumped at bedside.  I demonstrated for parents how to use the foley cup and baby took it well.  Burped baby and had father feed 20 mls more of Sim 19.  I explained to the father that, with more volume, this may be a better way to feed her.  He will choose either method to supplement.  I reminded them again to give at least 30 mls every 3 hours and more if she will take it.  Mother did not want to breastfeed this time but will breastfeed next time.  Parents will call for assistance as needed.  RN updated.   Maternal Data Formula Feeding for Exclusion: No Has patient been taught Hand Expression?: Yes Does the patient have breastfeeding experience prior to this delivery?: Yes  Feeding Feeding Type: Breast Milk Length of feed: 10 min  LATCH Score                   Interventions    Lactation Tools Discussed/Used     Consult Status Consult Status: Follow-up Date: 01/11/18 Follow-up type: In-patient    Priti Consoli R Isaiha Asare 01/10/2018, 1:16 PM

## 2018-01-10 NOTE — Discharge Instructions (Signed)

## 2018-01-10 NOTE — Lactation Note (Signed)
This note was copied from a baby's chart. Lactation Consultation Note  Patient Name: Joan Becker [id number]'E Date: 01/10/2018 Reason for consult: Follow-up assessment;Hyperbilirubinemia;Difficult latch Type of Endocrine Disorder?: Diabetes  P2 mother whose infant is now 67 hours old  Mother called for latch assistance.  Mother's breasts are very large, soft and non tender.  Her nipples are flat and invert with hand expression and when baby latches.  I have been working with this mother the last 3 days and I continue to have her pre-pump prior to latching.  This has been the most effective way to get baby to latch and stay latched onto breast.  At this feed baby was able to latch in the cross cradle hold on the left breast after pre-pumping. However, she was not interested in sucking and mother stated she just fed about an hour and a half ago but she just wanted to try.  I placed her back in the bassinet under phototherapy while mother began pumping for the supplementation.  Mother continues to pump approximately 20 mls with pumping.  Father supplements with the curved tip syringe (his choice after I showed him the foley cup this morning).  Father aware of the increased volumes baby should be taking now and feeds her appropriately.    Parents have no further questions/concerns at this time.  I suggested mother continue to do the cross cradle hold since this works the best for mother and baby.  She is appreciative of all the suggestions given and really hopes to be discharged tomorrow.  Mother will call as needed for assistance.     Maternal Data Formula Feeding for Exclusion: No Has patient been taught Hand Expression?: Yes Does the patient have breastfeeding experience prior to this delivery?: Yes  Feeding Feeding Type: Breast Fed Length of feed: 30 min  LATCH Score Latch: Too sleepy or reluctant, no latch achieved, no sucking elicited.  Audible Swallowing: None  Type of Nipple:  Flat  Comfort (Breast/Nipple): Soft / non-tender  Hold (Positioning): Assistance needed to correctly position infant at breast and maintain latch.  LATCH Score: 4  Interventions Interventions: Breast feeding basics reviewed;Assisted with latch;Skin to skin;Breast massage;Hand express;Pre-pump if needed;Expressed milk;Position options;Support pillows;Adjust position;Breast compression;Shells;DEBP  Lactation Tools Discussed/Used Tools: Shells;Pump   Consult Status Consult Status: Follow-up Date: 01/11/18 Follow-up type: In-patient    Lambros Cerro R Valeen Borys 01/10/2018, 7:13 PM

## 2018-01-10 NOTE — Lactation Note (Signed)
This note was copied from a baby's chart. Lactation Consultation Note  Patient Name: Joan Becker OILNZ'V Date: 01/10/2018 Reason for consult: Follow-up assessment;Early term 37-38.6wks;Hyperbilirubinemia;Maternal endocrine disorder Type of Endocrine Disorder?: Diabetes  P2 mother whose infant is now 50 hours old.  This is an ETI at 37 weeks with a 9% weight loss  Baby is on double phototherapy and will continue at least through tomorrow morning per MD.  Loney Laurence will be drawn this evening again.    Mother stated that baby is breastfeeding better and she does not need to use a NS.  Baby is in bassinet under phototherapy and not showing any feeding cues.  I encouraged parents to increase supplementation volumes to 30-60 today.  I also demonstrated using the Foley cup as an alternative to the syringe.  Father does the supplementation and I offered to come back at feeding time to show him how to use it.  He will call me as needed.  Mother will breastfeed first, pump after feeding and supplement with increased volumes.  MD arrived to room after I arrived and also stressed the importance of increasing volumes today.  Family disappointed that they have to stay another day but understand the reason why.  Parents will call as needed for assistance.   Maternal Data Formula Feeding for Exclusion: No Has patient been taught Hand Expression?: Yes  Feeding Feeding Type: Breast Milk  LATCH Score                   Interventions    Lactation Tools Discussed/Used     Consult Status Consult Status: Follow-up Date: 01/11/18 Follow-up type: In-patient    Chelsea Nusz R Ruhama Lehew 01/10/2018, 10:51 AM

## 2018-01-11 ENCOUNTER — Ambulatory Visit: Payer: Self-pay

## 2018-01-11 LAB — GLUCOSE, CAPILLARY: GLUCOSE-CAPILLARY: 83 mg/dL (ref 70–99)

## 2018-01-11 NOTE — Lactation Note (Deleted)
This note was copied from a baby's chart. Lactation Consultation Note  Patient Name: Joan Becker JOITG'P Date: 01/11/2018 Reason for consult: Follow-up assessment;Infant weight loss(7% weight loss . Of Double Photo this am and for D/C ) Type of Endocrine Disorder?: Thyroid  Baby is 93 hours old LC reviewed sore nipple and engorgement prevention and tx.  Per mom will have a DEBP at home and also is active with [facility].  LC recommended since she has a pump when the baby is not cluster  Feeding to add post pumping to enhance let down, and increase let down And supplement milk back to baby.  Mother informed of post-discharge support and given phone number to the lactation department, including services for phone call assistance; out-patient appointments; and breastfeeding support group. List of other breastfeeding resources in the community given in the handout. Encouraged mother to call for problems or concerns related to breastfeeding.  LC did not observe a feeding , mom, baby and dad ready for D/C and baby in car seat and baby recently breast fed per mom.       Maternal Data    Feeding Feeding Type: (pe rmom last fed at 1110 for 10 mins ) Length of feed: 10 min  LATCH Score                   Interventions Interventions: Breast feeding basics reviewed  Lactation Tools Discussed/Used     Consult Status Consult Status: Complete Date: 01/11/18    Kathrin Greathouse 01/11/2018, 11:37 AM

## 2018-01-11 NOTE — Lactation Note (Signed)
This note was copied from a baby's chart. Lactation Consultation Note  Patient Name: Joan Becker BHALP'F Date: 01/11/2018 Reason for consult: Follow-up assessment;Hyperbilirubinemia;Early term 27-38.6wks Mom request follow up w/ lactation per nurse. LC entered room mom incoherent but dad is awake. Per dad, and on infant feeding  guide ( yellow sheet) . Mom BF for  17 minutes, then gave infant 20 ml of EBM and supplemented w/  additional 20 ml of formula. Infant is on billi lights at this time, per dad , he is familiar with this due to  his other child having  elevated billi and family having stay in hospital he  has experienced this before. LC left number for family to  call if mom needs further assistance or help w/ latching infant to breast.   Maternal Data    Feeding    LATCH Score                   Interventions    Lactation Tools Discussed/Used     Consult Status Consult Status: Follow-up Date: 01/12/18 Follow-up type: In-patient    Danelle Earthly 01/11/2018, 10:35 PM

## 2018-01-11 NOTE — Lactation Note (Signed)
This note was copied from a baby's chart. Lactation Consultation Note  Patient Name: Joan Becker ALPFX'T Date: 01/11/2018 Reason for consult: Follow-up assessment;Mother's request Mom requesting help with latch.  Baby crying and mom getting frustrated.  Mom confined to wheel chair.  Put pillows under moms arms to support her so she could support baby .  After got mom comfortable, baby latched easily. Baby content and breastfeeding well.  Observed 15 minutes of the feed.  Infant breastfeeding well. Urged mom to do breast compression to get her more milk with breastfeeding.  Mom Pumping past bf and feeding back approximately 40 ml past bf. Mom not on [facility].  Mom concerned about needing a DEBP  and not having one.  Reviewed WIC guidelines with mom and explained to her that Medicaid makes you automatically eligible.  Urged her to apply for [facility] services. Praised BF efforts.  Urged mom to call for breastfeeding as needed.  Will follow up with her tomorrow.    Feeding Feeding Type: Formula Length of feed: 10 min  LATCH Score Latch: Grasps breast easily, tongue down, lips flanged, rhythmical sucking.  Audible Swallowing: None(no audible swallows heard/but mom reprtos that she ususally hears them however swallowings noted)  Type of Nipple: Everted at rest and after stimulation  Comfort (Breast/Nipple): Soft / non-tender  Hold (Positioning): Assistance needed to correctly position infant at breast and maintain latch.  LATCH Score: 7  Interventions Interventions: Assisted with latch;Breast compression;Adjust position;Support pillows  Lactation Tools Discussed/Used WIC Program: No   Consult Status Consult Status: Follow-up Date: 01/12/18 Follow-up type: In-patient    [facility] Michaelle Copas 01/11/2018, 3:49 PM

## 2018-01-12 ENCOUNTER — Ambulatory Visit: Payer: Self-pay

## 2018-01-12 ENCOUNTER — Encounter: Payer: Medicaid Other | Admitting: Obstetrics & Gynecology

## 2018-01-12 NOTE — Lactation Note (Signed)
This note was copied from a baby's chart. Lactation Consultation Note  Patient Name: Joan Becker WUJWJ'X Date: 01/12/2018 Reason for consult: Follow-up assessment;Infant weight loss;Early term 37-38.6wks;Hyperbilirubinemia(6% weight loss, Photo tx D/C this am, repeat Bili at 1316 - 9.7 ) Type of Endocrine Disorder?: PCOS(and Diabetes. ) Baby is 13 days old and for D/C today.  Per mom baby has a F/U appt tomorrow for weight check and repeat Bili.  Baby last fed at 1100, and supplemented.  LC reviewed LC plan  STS feedings until the baby can stay awake for feeding, weight is back to birth weight,  And baby is gaining steadily.  Sore nipple and engorgement prevention and tx reviewed.  Mom denies soreness.  Mom asked about continuing to supplement with a curved tip syringe.  LC recommended switching from the curved tip syringe to a medium based nipple to  Work on getting the baby to open wider for a deeper latch at the breast .  Per mom has DEBP at home.  Mother informed of post-discharge support and given phone number to the lactation department, including services for phone call assistance; out-patient appointments; and breastfeeding support group. List of other breastfeeding resources in the community given in the handout. Encouraged mother to call for problems or concerns related to breastfeeding. Per mom plans to keep track of the feedings , wets and stools on her phone.    Maternal Data    Feeding Feeding Type: (per mom baby last fed 1100 ) Length of feed: 20 min  LATCH Score                   Interventions Interventions: Breast feeding basics reviewed  Lactation Tools Discussed/Used Tools: Pump Flange Size: Other (comment);27;30(LC recommended for mom tto take the #27's with her , when the fullness settles down , may need the #27's ) Shell Type: Inverted Breast pump type: Manual;Double-Electric Breast Pump   Consult Status Consult Status: Complete Date:  01/12/18    Kathrin Greathouse 01/12/2018, 2:24 PM

## 2018-01-13 ENCOUNTER — Encounter (INDEPENDENT_AMBULATORY_CARE_PROVIDER_SITE_OTHER): Payer: Self-pay | Admitting: Orthopaedic Surgery
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
## 2018-01-18 ENCOUNTER — Ambulatory Visit (INDEPENDENT_AMBULATORY_CARE_PROVIDER_SITE_OTHER): Payer: Medicaid Other

## 2018-01-18 ENCOUNTER — Encounter (INDEPENDENT_AMBULATORY_CARE_PROVIDER_SITE_OTHER): Payer: Self-pay | Admitting: Orthopaedic Surgery

## 2018-01-18 ENCOUNTER — Ambulatory Visit (INDEPENDENT_AMBULATORY_CARE_PROVIDER_SITE_OTHER): Payer: Medicaid Other | Admitting: Orthopaedic Surgery

## 2018-01-18 VITALS — BP 107/74 | HR 84 | Ht 67.0 in

## 2018-01-18 DIAGNOSIS — S8264XD Nondisplaced fracture of lateral malleolus of right fibula, subsequent encounter for closed fracture with routine healing: Secondary | ICD-10-CM

## 2018-01-18 DIAGNOSIS — S52135D Nondisplaced fracture of neck of left radius, subsequent encounter for closed fracture with routine healing: Secondary | ICD-10-CM | POA: Diagnosis not present

## 2018-01-18 DIAGNOSIS — S82141D Displaced bicondylar fracture of right tibia, subsequent encounter for closed fracture with routine healing: Secondary | ICD-10-CM
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
## 2018-01-19 ENCOUNTER — Encounter: Payer: Medicaid Other | Admitting: Family Medicine

## 2018-01-21 ENCOUNTER — Ambulatory Visit: Payer: Medicaid Other | Admitting: Family Medicine

## 2018-01-27 ENCOUNTER — Encounter: Payer: Self-pay | Admitting: Family Medicine

## 2018-01-27 ENCOUNTER — Ambulatory Visit: Payer: Medicaid Other | Admitting: Family Medicine

## 2018-01-27 VITALS — BP 118/70 | HR 75 | Ht 67.0 in | Wt 296.0 lb

## 2018-01-27 DIAGNOSIS — Z5189 Encounter for other specified aftercare: Secondary | ICD-10-CM

## 2018-01-27 DIAGNOSIS — B372 Candidiasis of skin and nail: Secondary | ICD-10-CM

## 2018-01-27 MED ORDER — NYSTATIN 100000 UNIT/GM EX POWD: Freq: Four times a day (QID) | CUTANEOUS | 2 refills | Status: DC

## 2018-01-27 NOTE — Progress Notes (Signed)
   Subjective:    Patient ID: Joan LamasSheena G Becker, female    DOB: December 08, 1985, 32 y.o.   MRN: [id number]  HPI Seen for incision check. 3 weeks s/p PLTCS for breech. Pain minimal. Has a lot of moisture in that area.   Review of Systems     Objective:   Physical Exam  Constitutional: She appears well-developed and well-nourished.  Cardiovascular: Normal rate and regular rhythm.  Pulmonary/Chest: Effort normal and breath sounds normal.  Abdominal: Soft. She exhibits no distension. There is no tenderness. There is no guarding.  Incision well healed. The incision is moist, especially with overlap of skin.        Assessment & Plan:  1. Visit for wound check 2. Yeast dermatitis Nystatin. F/u in 1 week.

## 2018-02-03 ENCOUNTER — Ambulatory Visit (INDEPENDENT_AMBULATORY_CARE_PROVIDER_SITE_OTHER): Payer: Medicaid Other | Admitting: Family Medicine

## 2018-02-03 ENCOUNTER — Encounter: Payer: Self-pay | Admitting: Family Medicine

## 2018-02-03 VITALS — BP 125/80 | HR 72

## 2018-02-03 DIAGNOSIS — Z9889 Other specified postprocedural states: Secondary | ICD-10-CM

## 2018-02-03 DIAGNOSIS — Z5189 Encounter for other specified aftercare: Secondary | ICD-10-CM

## 2018-02-03 NOTE — Progress Notes (Signed)
   Subjective:    Patient ID: Joan Becker, female    DOB: 1986-03-28, 32 y.o.   MRN: [id number]  HPI  Seen for wound check. Using nystatin powder. Slight bleeding on right corner, but no pain.  Review of Systems     Objective:   Physical Exam  Constitutional: She appears well-developed and well-nourished.  Abdominal: Soft. She exhibits no mass. There is no tenderness. There is no guarding.  Clean and dry. Slight bleeding from right corner, but no evidence of infection      Assessment & Plan:  1. Visit for wound check Improved. F/u in 2 weeks for PP check

## 2018-02-16 ENCOUNTER — Ambulatory Visit (INDEPENDENT_AMBULATORY_CARE_PROVIDER_SITE_OTHER): Payer: Medicaid Other | Admitting: Orthopaedic Surgery

## 2018-02-18 ENCOUNTER — Telehealth (INDEPENDENT_AMBULATORY_CARE_PROVIDER_SITE_OTHER): Payer: Self-pay | Admitting: Orthopaedic Surgery

## 2018-02-18 NOTE — Telephone Encounter (Signed)
Patient called back to r/s her appt she had to cancel on 10/9. It was for a 4 week follow up, Dr. Ophelia Charter next available is 10/29. Should she wait this long or can we open her up an appt slot? Please advise # [phone]

## 2018-02-18 NOTE — Telephone Encounter (Signed)
Can you please put patient in 3:00pm time slot on Friday, 10/18 for Dr. Ophelia Charter. She is a follow up ankle fracture, elbow fracture, and ORIF tibial plateau fracture.

## 2018-02-21 NOTE — Telephone Encounter (Signed)
Appointment added. 

## 2018-02-24 ENCOUNTER — Encounter: Payer: Self-pay | Admitting: Obstetrics & Gynecology

## 2018-02-24 ENCOUNTER — Ambulatory Visit (INDEPENDENT_AMBULATORY_CARE_PROVIDER_SITE_OTHER): Payer: Medicaid Other | Admitting: Obstetrics & Gynecology

## 2018-02-24 VITALS — BP 114/71 | HR 74 | Ht 67.0 in | Wt 292.1 lb

## 2018-02-24 DIAGNOSIS — O24439 Gestational diabetes mellitus in the puerperium, unspecified control: Secondary | ICD-10-CM

## 2018-02-24 DIAGNOSIS — O900 Disruption of cesarean delivery wound: Secondary | ICD-10-CM

## 2018-02-24 NOTE — Progress Notes (Signed)
Subjective:     Joan Becker is a 31 y.o. female who presents for a postpartum visit. She is 7 weeks postpartum following a low cervical transverse Cesarean section. I have fully reviewed the prenatal and intrapartum course. The delivery was at 37 gestational weeks. Outcome: primary cesarean section, low transverse incision. Anesthesia: spinal. Postpartum course has been uncomplicated. Pt is now walking without a walker or assisted device. She is to be scheduled to have surgery on her right foot. Baby's course has been complicated with issues with her hip. She is scheduled to go to [facility] next week. Baby is feeding by breast. Bleeding no bleeding. Bowel function is normal. Bladder function is normal. Patient is not sexually active. Contraception method is none. Postpartum depression screening: negative.  The following portions of the patient's history were reviewed and updated as appropriate: allergies, current medications, past family history, past medical history, past social history, past surgical history and problem list.  Review of Systems Pertinent items are noted in HPI.   Objective:    LMP 04/02/2017   BP 114/71   Pulse 74   Ht 5\' 7"  (1.702 m)   Wt 292 lb 1.9 oz (132.5 kg)   LMP 04/02/2017   BMI 45.75 kg/m   CONSTITUTIONAL: Well-developed, well-nourished female in no acute distress.  HENT:  Normocephalic, atraumatic EYES: Conjunctivae and EOM are normal. No scleral icterus.  NECK: Normal range of motion SKIN: Skin is warm and dry. No rash noted. Not diaphoretic.No pallor. NEUROLGIC: Alert and oriented to person, place, and time. Normal coordination.  Abd: obese, incision: separating in spots under pannus. Treated with silver nitrate.       Assessment:    7 week postpartum exam. Pap smear not done at today's visit.  H/o GDM. Needs a 2 hours GTT- pt ate breakfast. She cant come next week.    Plan:    1. Contraception: vasectomy 2. Needs PP glucose testing. Pt has prev  declined the 2 hours GTT. She said she will get this one done. HgbA1c today  3. Follow up in: 2 weeks or as needed.   4. Incision separation- keep incision site clean and DRY. F/u in 2 weeks   Joda Braatz L. Harraway-Smith, M.D., [facility]

## 2018-02-25 ENCOUNTER — Ambulatory Visit (INDEPENDENT_AMBULATORY_CARE_PROVIDER_SITE_OTHER): Payer: Medicaid Other

## 2018-02-25 ENCOUNTER — Ambulatory Visit (INDEPENDENT_AMBULATORY_CARE_PROVIDER_SITE_OTHER): Payer: Medicaid Other | Admitting: Orthopaedic Surgery

## 2018-02-25 ENCOUNTER — Encounter (INDEPENDENT_AMBULATORY_CARE_PROVIDER_SITE_OTHER): Payer: Self-pay | Admitting: Orthopaedic Surgery

## 2018-02-25 ENCOUNTER — Ambulatory Visit (INDEPENDENT_AMBULATORY_CARE_PROVIDER_SITE_OTHER): Payer: Self-pay

## 2018-02-25 VITALS — BP 122/82 | HR 86 | Ht 67.0 in | Wt 292.0 lb

## 2018-02-25 DIAGNOSIS — Z0189 Encounter for other specified special examinations: Secondary | ICD-10-CM | POA: Diagnosis not present

## 2018-02-25 DIAGNOSIS — S82141D Displaced bicondylar fracture of right tibia, subsequent encounter for closed fracture with routine healing: Secondary | ICD-10-CM

## 2018-02-25 DIAGNOSIS — M25571 Pain in right ankle and joints of right foot: Secondary | ICD-10-CM | POA: Diagnosis not present

## 2018-02-25 DIAGNOSIS — M25572 Pain in left ankle and joints of left foot: Secondary | ICD-10-CM | POA: Diagnosis not present

## 2018-02-25 DIAGNOSIS — S8264XD Nondisplaced fracture of lateral malleolus of right fibula, subsequent encounter for closed fracture with routine healing: Secondary | ICD-10-CM

## 2018-02-25 LAB — HEMOGLOBIN A1C
ESTIMATED AVERAGE GLUCOSE: 88 mg/dL
Hgb A1c MFr Bld: 4.7 % — ABNORMAL LOW (ref 4.8–5.6)
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
## 2018-02-28 ENCOUNTER — Encounter (INDEPENDENT_AMBULATORY_CARE_PROVIDER_SITE_OTHER): Payer: Self-pay | Admitting: Orthopaedic Surgery

## 2018-03-02 ENCOUNTER — Ambulatory Visit (INDEPENDENT_AMBULATORY_CARE_PROVIDER_SITE_OTHER): Payer: Medicaid Other | Admitting: Orthopaedic Surgery

## 2018-03-03 ENCOUNTER — Telehealth (INDEPENDENT_AMBULATORY_CARE_PROVIDER_SITE_OTHER): Payer: Self-pay | Admitting: Orthopaedic Surgery

## 2018-03-03 ENCOUNTER — Ambulatory Visit
Admission: RE | Admit: 2018-03-03 | Discharge: 2018-03-03 | Disposition: A | Discharge status: Home / Self Care | Payer: Medicaid Other | Source: Ambulatory Visit | Attending: Orthopaedic Surgery | Admitting: Orthopaedic Surgery

## 2018-03-03 DIAGNOSIS — S8264XD Nondisplaced fracture of lateral malleolus of right fibula, subsequent encounter for closed fracture with routine healing: Secondary | ICD-10-CM

## 2018-03-03 DIAGNOSIS — M25571 Pain in right ankle and joints of right foot: Secondary | ICD-10-CM

## 2018-03-03 NOTE — Telephone Encounter (Signed)
Patient has MRI today called to scheduled her review and did not want next available. Can we please schedule something sooner patient is requesting a call from [place].  Patients # [phone]

## 2018-03-03 NOTE — Telephone Encounter (Signed)
I left voicemail for patient advising time has opened up tomorrow morning and Monday all day for Dr. Ophelia Charter. Advised to call and make appointment for MRI Review for open time.

## 2018-03-07 ENCOUNTER — Encounter (INDEPENDENT_AMBULATORY_CARE_PROVIDER_SITE_OTHER): Payer: Self-pay | Admitting: Orthopaedic Surgery

## 2018-03-07 ENCOUNTER — Ambulatory Visit (INDEPENDENT_AMBULATORY_CARE_PROVIDER_SITE_OTHER): Payer: Medicaid Other | Admitting: Orthopaedic Surgery

## 2018-03-07 ENCOUNTER — Ambulatory Visit (INDEPENDENT_AMBULATORY_CARE_PROVIDER_SITE_OTHER): Payer: Medicaid Other

## 2018-03-07 VITALS — BP 125/74 | HR 70 | Ht 67.0 in | Wt 292.0 lb

## 2018-03-07 DIAGNOSIS — S92141A Displaced dome fracture of right talus, initial encounter for closed fracture: Secondary | ICD-10-CM | POA: Insufficient documentation

## 2018-03-07 DIAGNOSIS — M25571 Pain in right ankle and joints of right foot: Secondary | ICD-10-CM

## 2018-03-07 DIAGNOSIS — S92141S Displaced dome fracture of right talus, sequela: Secondary | ICD-10-CM
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
## 2018-03-09 ENCOUNTER — Encounter (INDEPENDENT_AMBULATORY_CARE_PROVIDER_SITE_OTHER): Payer: Self-pay | Admitting: Orthopaedic Surgery

## 2018-03-10 MED ORDER — ACETAMINOPHEN-CODEINE #3 300-30 MG PO TABS: 1.0000 | ORAL_TABLET | Freq: Two times a day (BID) | ORAL | 0 refills | Status: DC | PRN

## 2018-03-10 NOTE — Telephone Encounter (Signed)
Can you advise on surgery scheduling?   

## 2018-03-18 ENCOUNTER — Encounter (INDEPENDENT_AMBULATORY_CARE_PROVIDER_SITE_OTHER): Payer: Self-pay | Admitting: Orthopaedic Surgery

## 2018-03-18 ENCOUNTER — Other Ambulatory Visit (INDEPENDENT_AMBULATORY_CARE_PROVIDER_SITE_OTHER): Payer: Self-pay | Admitting: Orthopaedic Surgery

## 2018-03-18 DIAGNOSIS — S92141S Displaced dome fracture of right talus, sequela: Secondary | ICD-10-CM

## 2018-03-21 ENCOUNTER — Ambulatory Visit (HOSPITAL_BASED_OUTPATIENT_CLINIC_OR_DEPARTMENT_OTHER): Payer: No Typology Code available for payment source | Admitting: Anesthesiology

## 2018-03-21 ENCOUNTER — Other Ambulatory Visit: Payer: Self-pay

## 2018-03-21 ENCOUNTER — Ambulatory Visit (HOSPITAL_BASED_OUTPATIENT_CLINIC_OR_DEPARTMENT_OTHER)
Admission: RE | Admit: 2018-03-21 | Discharge: 2018-03-21 | Disposition: A | Discharge status: Home / Self Care | Payer: No Typology Code available for payment source | Source: Ambulatory Visit | Attending: Orthopaedic Surgery | Admitting: Orthopaedic Surgery

## 2018-03-21 ENCOUNTER — Encounter (HOSPITAL_BASED_OUTPATIENT_CLINIC_OR_DEPARTMENT_OTHER)
Admission: RE | Disposition: A | Discharge status: Home / Self Care | Payer: Self-pay | Source: Ambulatory Visit | Attending: Orthopaedic Surgery

## 2018-03-21 ENCOUNTER — Encounter (HOSPITAL_BASED_OUTPATIENT_CLINIC_OR_DEPARTMENT_OTHER): Payer: Self-pay

## 2018-03-21 DIAGNOSIS — Z87442 Personal history of urinary calculi: Secondary | ICD-10-CM | POA: Insufficient documentation

## 2018-03-21 DIAGNOSIS — Z79890 Hormone replacement therapy (postmenopausal): Secondary | ICD-10-CM | POA: Insufficient documentation

## 2018-03-21 DIAGNOSIS — S92141S Displaced dome fracture of right talus, sequela: Secondary | ICD-10-CM

## 2018-03-21 DIAGNOSIS — Z6841 Body Mass Index (BMI) 40.0 and over, adult: Secondary | ICD-10-CM | POA: Insufficient documentation

## 2018-03-21 DIAGNOSIS — E039 Hypothyroidism, unspecified: Secondary | ICD-10-CM | POA: Diagnosis not present

## 2018-03-21 DIAGNOSIS — Z79899 Other long term (current) drug therapy: Secondary | ICD-10-CM | POA: Diagnosis not present

## 2018-03-21 DIAGNOSIS — M958 Other specified acquired deformities of musculoskeletal system: Secondary | ICD-10-CM

## 2018-03-21 DIAGNOSIS — S92141A Displaced dome fracture of right talus, initial encounter for closed fracture: Secondary | ICD-10-CM | POA: Diagnosis present

## 2018-03-21 DIAGNOSIS — E282 Polycystic ovarian syndrome: Secondary | ICD-10-CM | POA: Diagnosis not present

## 2018-03-21 HISTORY — PX: ANKLE ARTHROSCOPY: SHX545

## 2018-03-21 LAB — POCT PREGNANCY, URINE: Preg Test, Ur: NEGATIVE

## 2018-03-21 SURGERY — ARTHROSCOPY, ANKLE
Anesthesia: General | Laterality: Right

## 2018-03-21 MED ORDER — KETOROLAC TROMETHAMINE 30 MG/ML IJ SOLN
INTRAMUSCULAR | Status: AC
  Filled 2018-03-21: qty 1

## 2018-03-21 MED ORDER — ONDANSETRON HCL 4 MG/2ML IJ SOLN
INTRAMUSCULAR | Status: AC
  Filled 2018-03-21: qty 2

## 2018-03-21 MED ORDER — FENTANYL CITRATE (PF) 100 MCG/2ML IJ SOLN
INTRAMUSCULAR | Status: AC
  Filled 2018-03-21: qty 2

## 2018-03-21 MED ORDER — CEFAZOLIN SODIUM-DEXTROSE 2-4 GM/100ML-% IV SOLN: 2.0000 g | INTRAVENOUS | Status: DC

## 2018-03-21 MED ORDER — SCOPOLAMINE 1 MG/3DAYS TD PT72: 1.0000 | MEDICATED_PATCH | Freq: Once | TRANSDERMAL | Status: DC | PRN

## 2018-03-21 MED ORDER — LACTATED RINGERS IV SOLN
INTRAVENOUS | Status: DC
  Administered 2018-03-21: 10:00:00 via INTRAVENOUS

## 2018-03-21 MED ORDER — MIDAZOLAM HCL 2 MG/2ML IJ SOLN
INTRAMUSCULAR | Status: AC
  Filled 2018-03-21: qty 2

## 2018-03-21 MED ORDER — ONDANSETRON HCL 4 MG/2ML IJ SOLN: 4.0000 mg | Freq: Once | INTRAMUSCULAR | Status: DC | PRN

## 2018-03-21 MED ORDER — HYDROCODONE-ACETAMINOPHEN 7.5-325 MG PO TABS: 1.0000 | ORAL_TABLET | Freq: Once | ORAL | Status: DC | PRN

## 2018-03-21 MED ORDER — KETOROLAC TROMETHAMINE 30 MG/ML IJ SOLN
INTRAMUSCULAR | Status: DC | PRN
  Administered 2018-03-21: 30 mg via INTRAVENOUS

## 2018-03-21 MED ORDER — CEFAZOLIN SODIUM-DEXTROSE 1-4 GM/50ML-% IV SOLN
INTRAVENOUS | Status: AC
  Filled 2018-03-21: qty 50

## 2018-03-21 MED ORDER — DEXAMETHASONE SODIUM PHOSPHATE 10 MG/ML IJ SOLN
INTRAMUSCULAR | Status: AC
  Filled 2018-03-21: qty 1

## 2018-03-21 MED ORDER — DEXAMETHASONE SODIUM PHOSPHATE 4 MG/ML IJ SOLN
INTRAMUSCULAR | Status: DC | PRN
  Administered 2018-03-21: 10 mg via INTRAVENOUS

## 2018-03-21 MED ORDER — MIDAZOLAM HCL 2 MG/2ML IJ SOLN
1.0000 mg | INTRAMUSCULAR | Status: DC | PRN
  Administered 2018-03-21: 2 mg via INTRAVENOUS

## 2018-03-21 MED ORDER — FENTANYL CITRATE (PF) 100 MCG/2ML IJ SOLN
25.0000 ug | INTRAMUSCULAR | Status: DC | PRN
  Administered 2018-03-21 (×2): 50 ug via INTRAVENOUS

## 2018-03-21 MED ORDER — HYDROCODONE-ACETAMINOPHEN 5-325 MG PO TABS: 1.0000 | ORAL_TABLET | ORAL | 0 refills | Status: DC | PRN

## 2018-03-21 MED ORDER — PROPOFOL 10 MG/ML IV BOLUS
INTRAVENOUS | Status: DC | PRN
  Administered 2018-03-21: 200 mg via INTRAVENOUS

## 2018-03-21 MED ORDER — CHLORHEXIDINE GLUCONATE 4 % EX LIQD: 60.0000 mL | Freq: Once | CUTANEOUS | Status: DC

## 2018-03-21 MED ORDER — LIDOCAINE HCL (CARDIAC) PF 100 MG/5ML IV SOSY
PREFILLED_SYRINGE | INTRAVENOUS | Status: DC | PRN
  Administered 2018-03-21: 80 mg via INTRAVENOUS

## 2018-03-21 MED ORDER — CEFAZOLIN SODIUM-DEXTROSE 2-4 GM/100ML-% IV SOLN
INTRAVENOUS | Status: AC
  Filled 2018-03-21: qty 100

## 2018-03-21 MED ORDER — LIDOCAINE 2% (20 MG/ML) 5 ML SYRINGE
INTRAMUSCULAR | Status: AC
  Filled 2018-03-21: qty 5

## 2018-03-21 MED ORDER — DEXTROSE 5 % IV SOLN
3.0000 g | Freq: Once | INTRAVENOUS | Status: AC
  Administered 2018-03-21: 3 g via INTRAVENOUS

## 2018-03-21 MED ORDER — ONDANSETRON HCL 4 MG/2ML IJ SOLN
INTRAMUSCULAR | Status: DC | PRN
  Administered 2018-03-21: 4 mg via INTRAVENOUS

## 2018-03-21 MED ORDER — BUPIVACAINE HCL (PF) 0.25 % IJ SOLN
INTRAMUSCULAR | Status: AC
  Filled 2018-03-21: qty 30

## 2018-03-21 MED ORDER — BUPIVACAINE-EPINEPHRINE 0.25% -1:200000 IJ SOLN
INTRAMUSCULAR | Status: DC | PRN
  Administered 2018-03-21: 20 mL

## 2018-03-21 MED ORDER — FENTANYL CITRATE (PF) 100 MCG/2ML IJ SOLN
50.0000 ug | INTRAMUSCULAR | Status: DC | PRN
  Administered 2018-03-21: 100 ug via INTRAVENOUS

## 2018-03-21 SURGICAL SUPPLY — 66 items
APL SKNCLS STERI-STRIP NONHPOA (GAUZE/BANDAGES/DRESSINGS) ×1
BANDAGE ACE 4X5 VEL STRL LF (GAUZE/BANDAGES/DRESSINGS) ×3 IMPLANT
BANDAGE ACE 6X5 VEL STRL LF (GAUZE/BANDAGES/DRESSINGS) ×6 IMPLANT
BENZOIN TINCTURE PRP APPL 2/3 (GAUZE/BANDAGES/DRESSINGS) ×3 IMPLANT
BLADE 4.2CUDA (BLADE) ×3 IMPLANT
BLADE CUDA 5.5 (BLADE) IMPLANT
BLADE GREAT WHITE 4.2 (BLADE) IMPLANT
BLADE GREAT WHITE 4.2MM (BLADE)
BLADE SURG 11 STRL SS (BLADE) IMPLANT
BLADE SURG 15 STRL LF DISP TIS (BLADE) ×1 IMPLANT
BLADE SURG 15 STRL SS (BLADE) ×3
BNDG CMPR 9X4 STRL LF SNTH (GAUZE/BANDAGES/DRESSINGS)
BNDG CONFORM 3 STRL LF (GAUZE/BANDAGES/DRESSINGS) ×3 IMPLANT
BNDG ESMARK 4X9 LF (GAUZE/BANDAGES/DRESSINGS) IMPLANT
BRUSH SCRUB EZ PLAIN DRY (MISCELLANEOUS) ×3 IMPLANT
CLOSURE WOUND 1/4X4 (GAUZE/BANDAGES/DRESSINGS) ×1
CORD BIPOLAR FORCEPS 12FT (ELECTRODE) IMPLANT
COVER BACK TABLE 60X90IN (DRAPES) ×3 IMPLANT
COVER MAYO STAND STRL (DRAPES) ×3 IMPLANT
COVER WAND RF STERILE (DRAPES) IMPLANT
DECANTER SPIKE VIAL GLASS SM (MISCELLANEOUS) IMPLANT
DRAPE ARTHROSCOPY W/POUCH 90 (DRAPES) ×3 IMPLANT
DRAPE C-ARM 42X72 X-RAY (DRAPES) IMPLANT
DRAPE EXTREMITY T 121X128X90 (DRAPE) ×3 IMPLANT
DRAPE OEC MINIVIEW 54X84 (DRAPES) IMPLANT
DRSG TEGADERM 4X4.75 (GAUZE/BANDAGES/DRESSINGS) ×3 IMPLANT
DURAPREP 26ML APPLICATOR (WOUND CARE) ×3 IMPLANT
ELECT MENISCUS 165MM 90D (ELECTRODE) IMPLANT
ELECT REM PT RETURN 9FT ADLT (ELECTROSURGICAL)
ELECTRODE REM PT RTRN 9FT ADLT (ELECTROSURGICAL) IMPLANT
GAUZE SPONGE 4X4 12PLY STRL (GAUZE/BANDAGES/DRESSINGS) ×3 IMPLANT
GAUZE XEROFORM 1X8 LF (GAUZE/BANDAGES/DRESSINGS) ×3 IMPLANT
GLOVE BIO SURGEON STRL SZ7 (GLOVE) ×3 IMPLANT
GOWN STRL REUS W/ TWL LRG LVL3 (GOWN DISPOSABLE) ×1 IMPLANT
GOWN STRL REUS W/TWL LRG LVL3 (GOWN DISPOSABLE) ×3
HOLDER KNEE FOAM BLUE (MISCELLANEOUS) ×3 IMPLANT
KNEE WRAP E Z 3 GEL PACK (MISCELLANEOUS) IMPLANT
MANIFOLD NEPTUNE II (INSTRUMENTS) IMPLANT
NDL HYPO 25X1 1.5 SAFETY (NEEDLE) IMPLANT
NEEDLE HYPO 25X1 1.5 SAFETY (NEEDLE) IMPLANT
NS IRRIG 1000ML POUR BTL (IV SOLUTION) ×3 IMPLANT
PACK ARTHROSCOPY DSU (CUSTOM PROCEDURE TRAY) ×3 IMPLANT
PACK BASIN DAY SURGERY FS (CUSTOM PROCEDURE TRAY) ×3 IMPLANT
PAD CAST 3X4 CTTN HI CHSV (CAST SUPPLIES) ×1 IMPLANT
PAD CAST 4YDX4 CTTN HI CHSV (CAST SUPPLIES) ×1 IMPLANT
PADDING CAST ABS 4INX4YD NS (CAST SUPPLIES) ×2
PADDING CAST ABS 6INX4YD NS (CAST SUPPLIES)
PADDING CAST ABS COTTON 4X4 ST (CAST SUPPLIES) ×1 IMPLANT
PADDING CAST ABS COTTON 6X4 NS (CAST SUPPLIES) IMPLANT
PADDING CAST COTTON 3X4 STRL (CAST SUPPLIES) ×3
PADDING CAST COTTON 4X4 STRL (CAST SUPPLIES) ×3
PENCIL BUTTON HOLSTER BLD 10FT (ELECTRODE) IMPLANT
SHEET MEDIUM DRAPE 40X70 STRL (DRAPES) IMPLANT
SPLINT PLASTER CAST XFAST 3X15 (CAST SUPPLIES) IMPLANT
SPLINT PLASTER XTRA FASTSET 3X (CAST SUPPLIES)
STOCKINETTE 4X48 STRL (DRAPES) ×3 IMPLANT
STRIP CLOSURE SKIN 1/4X4 (GAUZE/BANDAGES/DRESSINGS) ×2 IMPLANT
SUT ETHILON 4 0 PS 2 18 (SUTURE) ×6 IMPLANT
SUT ETHILON 5 0 PS 2 18 (SUTURE) ×3 IMPLANT
SUT ETHILON 6 0 P 1 (SUTURE) ×3 IMPLANT
SYR BULB 3OZ (MISCELLANEOUS) IMPLANT
SYR CONTROL 10ML LL (SYRINGE) IMPLANT
TOWEL GREEN STERILE FF (TOWEL DISPOSABLE) ×6 IMPLANT
TUBING ARTHRO INFLOW-ONLY STRL (TUBING) ×3 IMPLANT
UNDERPAD 30X30 (UNDERPADS AND DIAPERS) ×3 IMPLANT
WATER STERILE IRR 1000ML POUR (IV SOLUTION) ×3 IMPLANT

## 2018-03-21 NOTE — Anesthesia Preprocedure Evaluation (Signed)
Anesthesia Evaluation    Airway Mallampati: II  TM Distance: >3 FB Neck ROM: Full    Dental no notable dental hx. (+) Teeth Intact   Pulmonary neg pulmonary ROS,    Pulmonary exam normal breath sounds clear to auscultation       Cardiovascular negative cardio ROS Normal cardiovascular exam Rhythm:Regular Rate:Normal     Neuro/Psych negative neurological ROS  negative psych ROS   GI/Hepatic negative GI ROS, Neg liver ROS,   Endo/Other  diabetes, GestationalHypothyroidism Morbid obesity  Renal/GU Renal diseaseHx/o renal calculi  negative genitourinary   Musculoskeletal Right talus osteochondral Fx Loose bodies right ankle   Abdominal (+) + obese,   Peds  Hematology  (+) anemia , Lovenox therapy- last dose   Anesthesia Other Findings   Reproductive/Obstetrics                             Anesthesia Physical Anesthesia Plan  ASA: III  Anesthesia Plan: General   Post-op Pain Management:    Induction: Intravenous  PONV Risk Score and Plan: 4 or greater and Scopolamine patch - Pre-op, Midazolam, Ondansetron, Dexamethasone and Treatment may vary due to age or medical condition  Airway Management Planned: LMA  Additional Equipment:   Intra-op Plan:   Post-operative Plan: Extubation in OR  Informed Consent: I have reviewed the patients History and Physical, chart, labs and discussed the procedure including the risks, benefits and alternatives for the proposed anesthesia with the patient or authorized representative who has indicated his/her understanding and acceptance.   Dental advisory given  Plan Discussed with: CRNA and Surgeon  Anesthesia Plan Comments:         Anesthesia Quick Evaluation

## 2018-03-21 NOTE — Transfer of Care (Signed)
Immediate Anesthesia Transfer of Care Note  Patient: Joan Becker  Procedure(s) Performed: RIGHT ANKLE ARTHROSCOPY, OSTEOCHONDRAL DEBRIDEMENT, REMOVAL OF LOOSE BODY (Right )  Patient Location: PACU  Anesthesia Type:General  Level of Consciousness: awake, sedated and patient cooperative  Airway & Oxygen Therapy: Patient Spontanous Breathing and Patient connected to face mask oxygen  Post-op Assessment: Report given to RN and Post -op Vital signs reviewed and stable  Post vital signs: Reviewed and stable  Last Vitals:  Vitals Value Taken Time  BP    Temp    Pulse 78 03/21/2018 11:32 AM  Resp 21 03/21/2018 11:32 AM  SpO2 100 % 03/21/2018 11:32 AM  Vitals shown include unvalidated device data.  Last Pain:  Vitals:   03/21/18 0906  TempSrc: Oral  PainSc: 3       Patients Stated Pain Goal: 3 (03/21/18 0950)  Complications: No apparent anesthesia complications

## 2018-03-21 NOTE — Discharge Instructions (Signed)
Dressing off 24 - 48 hours apply bandaid May shower after dressing off Reapply ace wrap after dressing to help with swelling Gradual activity,  Elevate foot above level of the heart  Post Anesthesia Home Care Instructions  Activity: Get plenty of rest for the remainder of the day. A responsible individual must stay with you for 24 hours following the procedure.  For the next 24 hours, DO NOT: -Drive a car -Advertising copywriter -Drink alcoholic beverages -Take any medication unless instructed by your physician -Make any legal decisions or sign important papers.  Meals: Start with liquid foods such as gelatin or soup. Progress to regular foods as tolerated. Avoid greasy, spicy, heavy foods. If nausea and/or vomiting occur, drink only clear liquids until the nausea and/or vomiting subsides. Call your physician if vomiting continues.  Special Instructions/Symptoms: Your throat may feel dry or sore from the anesthesia or the breathing tube placed in your throat during surgery. If this causes discomfort, gargle with warm salt water. The discomfort should disappear within 24 hours.  If you had a scopolamine patch placed behind your ear for the management of post- operative nausea and/or vomiting:  1. The medication in the patch is effective for 72 hours, after which it should be removed.  Wrap patch in a tissue and discard in the trash. Wash hands thoroughly with soap and water. 2. You may remove the patch earlier than 72 hours if you experience unpleasant side effects which may include dry mouth, dizziness or visual disturbances. 3. Avoid touching the patch. Wash your hands with soap and water after contact with the patch.   If you are breast feeding use tylenol # 3 , if not breast feeding you may the norco for pain.   Remove dressing at 24 to 48 hrs OK to shower , apply band aid over two sutures in the front of your ankle and then apply ace wrap. After a couple days of ankle elevation you may  ambulate as tolerated by pain. Use your walker initially until your ankle is less sore and not swelling as much. See Dr. Ophelia Charter in one week.

## 2018-03-21 NOTE — Interval H&P Note (Signed)
History and Physical Interval Note:  03/21/2018 10:28 AM  Joan Becker  has presented today for surgery, with the diagnosis of right ankle talar osteochondral fracture  The various methods of treatment have been discussed with the patient and family. After consideration of risks, benefits and other options for treatment, the patient has consented to  Procedure(s): RIGHT ANKLE ARTHROSCOPY, OSTEOCHONDRAL DEBRIDEMENT, REMOVAL OF LOOSE BODY (Right) REMOVAL OF LOOSE BODY (Right) as a surgical intervention .  The patient's history has been reviewed, patient examined, no change in status, stable for surgery.  I have reviewed the patient's chart and labs.  Questions were answered to the patient's satisfaction.     Eldred Manges
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
## 2018-03-21 NOTE — Anesthesia Procedure Notes (Signed)
Procedure Name: LMA Insertion Date/Time: 03/21/2018 10:44 AM Performed by: Gar Gibbon, CRNA Pre-anesthesia Checklist: Patient identified, Emergency Drugs available, Suction available and Patient being monitored Patient Re-evaluated:Patient Re-evaluated prior to induction Oxygen Delivery Method: Circle system utilized Preoxygenation: Pre-oxygenation with 100% oxygen Induction Type: IV induction Ventilation: Mask ventilation without difficulty LMA: LMA inserted LMA Size: 4.0 Number of attempts: 1 Airway Equipment and Method: Bite block Placement Confirmation: positive ETCO2 Tube secured with: Tape Dental Injury: Teeth and Oropharynx as per pre-operative assessment

## 2018-03-21 NOTE — Anesthesia Postprocedure Evaluation (Signed)
Anesthesia Post Note  Patient: Joan Becker  Procedure(s) Performed: RIGHT ANKLE ARTHROSCOPY, OSTEOCHONDRAL DEBRIDEMENT, REMOVAL OF LOOSE BODY (Right )     Patient location during evaluation: PACU Anesthesia Type: General Level of consciousness: awake and alert and oriented Pain management: pain level controlled Vital Signs Assessment: post-procedure vital signs reviewed and stable Respiratory status: spontaneous breathing, nonlabored ventilation and respiratory function stable Cardiovascular status: blood pressure returned to baseline and stable Postop Assessment: no apparent nausea or vomiting Anesthetic complications: no    Last Vitals:  Vitals:   03/21/18 1145 03/21/18 1200  BP: 122/78 115/63  Pulse: 77 76  Resp: 19 18  Temp:    SpO2: 94% 97%    Last Pain:  Vitals:   03/21/18 1200  TempSrc:   PainSc: 6                  Leib Elahi A.

## 2018-03-22 ENCOUNTER — Encounter (HOSPITAL_BASED_OUTPATIENT_CLINIC_OR_DEPARTMENT_OTHER): Payer: Self-pay | Admitting: Orthopaedic Surgery

## 2018-03-29 ENCOUNTER — Inpatient Hospital Stay (INDEPENDENT_AMBULATORY_CARE_PROVIDER_SITE_OTHER): Payer: Medicaid Other | Admitting: Orthopaedic Surgery

## 2018-03-31 ENCOUNTER — Encounter (INDEPENDENT_AMBULATORY_CARE_PROVIDER_SITE_OTHER): Payer: Self-pay | Admitting: Surgery

## 2018-03-31 ENCOUNTER — Ambulatory Visit (INDEPENDENT_AMBULATORY_CARE_PROVIDER_SITE_OTHER): Payer: Self-pay | Admitting: Surgery

## 2018-03-31 DIAGNOSIS — M25571 Pain in right ankle and joints of right foot: Secondary | ICD-10-CM

## 2018-03-31 NOTE — Progress Notes (Signed)
32 year old white female who is 10 days status post right ankle scope with removal loose body returns.  States that she is doing well.  Feels like mechanical symptoms have improved.  Exam No much by the way swelling.  Sutures removed insertion was applied.  Portals healing well without signs of infection.  Calf nontender.  Plan She was given some home exercise to do.  Also given resistance band.  Advised patient that it will take some time for her ankle to get better must be patient.  Follow-up with Dr. Ophelia CharterYates in 4 weeks for recheck.

## 2018-04-21 ENCOUNTER — Encounter (INDEPENDENT_AMBULATORY_CARE_PROVIDER_SITE_OTHER): Payer: Self-pay | Admitting: Orthopaedic Surgery

## 2018-04-29 ENCOUNTER — Ambulatory Visit (INDEPENDENT_AMBULATORY_CARE_PROVIDER_SITE_OTHER): Payer: Self-pay | Admitting: Orthopaedic Surgery

## 2018-04-29 ENCOUNTER — Encounter (INDEPENDENT_AMBULATORY_CARE_PROVIDER_SITE_OTHER): Payer: Self-pay | Admitting: Orthopaedic Surgery

## 2018-04-29 VITALS — BP 114/70 | HR 88 | Ht 67.0 in | Wt 287.0 lb

## 2018-04-29 DIAGNOSIS — S92141S Displaced dome fracture of right talus, sequela: Secondary | ICD-10-CM
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
## 2018-04-29 NOTE — Addendum Note (Signed)
Addended by: Rogers SeedsYEATTS, Theo Reither M on: 04/29/2018 03:45 PM   Modules accepted: Orders

## 2018-05-13 ENCOUNTER — Other Ambulatory Visit: Payer: Self-pay

## 2018-05-13 ENCOUNTER — Encounter: Payer: Self-pay | Admitting: Physical Therapy

## 2018-05-13 ENCOUNTER — Ambulatory Visit: Payer: Self-pay | Attending: Orthopaedic Surgery | Admitting: Physical Therapy

## 2018-05-13 DIAGNOSIS — M6281 Muscle weakness (generalized): Secondary | ICD-10-CM | POA: Insufficient documentation

## 2018-05-13 DIAGNOSIS — R2689 Other abnormalities of gait and mobility: Secondary | ICD-10-CM | POA: Insufficient documentation

## 2018-05-13 DIAGNOSIS — M25671 Stiffness of right ankle, not elsewhere classified: Secondary | ICD-10-CM | POA: Insufficient documentation
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
## 2018-05-13 NOTE — Patient Instructions (Signed)
Access Code: [email or web address]  URL: [email or web address]  Date: 05/13/2018  Prepared by: Donita Brooks   Exercises  Standing Soleus Stretch - 10 reps - 2 sets - 20 hold - 5-6x daily - 7x weekly  Standing Gastroc Stretch - 2 sets - 20 hold - 5-6x daily - 7x weekly  Seated Ankle Alphabet - 3 sets - 15-6x daily - 7x weekly    [facility] [address], [address] [place], [place] [address] Phone # [phone] Fax [phone]

## 2018-05-18 ENCOUNTER — Encounter: Payer: Self-pay | Admitting: Physical Therapy

## 2018-05-18 ENCOUNTER — Ambulatory Visit: Payer: Self-pay | Admitting: Physical Therapy

## 2018-05-18 DIAGNOSIS — R2689 Other abnormalities of gait and mobility: Secondary | ICD-10-CM

## 2018-05-18 DIAGNOSIS — M25671 Stiffness of right ankle, not elsewhere classified: Secondary | ICD-10-CM

## 2018-05-18 DIAGNOSIS — M6281 Muscle weakness (generalized): Secondary | ICD-10-CM
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
## 2018-05-18 NOTE — Patient Instructions (Signed)
Access Code: [email or web address]  URL: [email or web address]  Date: 05/18/2018  Prepared by: Loistine SimasJohanna Beuhring   Exercises  Standing Soleus Stretch - 10 reps - 2 sets - 20 hold - 5-6x daily - 7x weekly  Standing Gastroc Stretch - 2 sets - 20 hold - 5-6x daily - 7x weekly  Seated Ankle Alphabet - 3 sets - 15-6x daily - 7x weekly  Seated Long Arc Quad - 15 reps - 3 sets - 1x daily - 7x weekly  Standing Ankle Dorsiflexion Stretch on Chair - 10 reps - 3 sets - 5 hold - 1x daily - 7x weekly  Seated Ankle Eversion AROM - 10 reps - 3 sets - 1x daily - 7x weekly  Seated Ankle Dorsiflexion AROM - 10 reps - 3 sets - 1x daily - 7x weekly

## 2018-05-20 ENCOUNTER — Ambulatory Visit: Payer: Self-pay | Admitting: Physical Therapy

## 2018-05-20 ENCOUNTER — Encounter: Payer: Self-pay | Admitting: Physical Therapy

## 2018-05-20 DIAGNOSIS — R2689 Other abnormalities of gait and mobility: Secondary | ICD-10-CM

## 2018-05-20 DIAGNOSIS — M25671 Stiffness of right ankle, not elsewhere classified: Secondary | ICD-10-CM

## 2018-05-20 DIAGNOSIS — M6281 Muscle weakness (generalized): Secondary | ICD-10-CM
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
## 2018-05-25 ENCOUNTER — Ambulatory Visit: Payer: Self-pay | Admitting: Physical Therapy

## 2018-05-25 ENCOUNTER — Encounter: Payer: Self-pay | Admitting: Physical Therapy

## 2018-05-25 DIAGNOSIS — M6281 Muscle weakness (generalized): Secondary | ICD-10-CM

## 2018-05-25 DIAGNOSIS — M25671 Stiffness of right ankle, not elsewhere classified: Secondary | ICD-10-CM

## 2018-05-25 DIAGNOSIS — R2689 Other abnormalities of gait and mobility: Secondary | ICD-10-CM

## 2018-05-25 NOTE — Patient Instructions (Signed)
Access Code: [email or web address]  URL: [email or web address]  Date: 05/25/2018  Prepared by: Loistine Simas Magaret Justo   Exercises  Standing Soleus Stretch - 10 reps - 2 sets - 20 hold - 5-6x daily - 7x weekly  Standing Gastroc Stretch - 2 sets - 20 hold - 5-6x daily - 7x weekly  Seated Ankle Alphabet - 3 sets - 15-6x daily - 7x weekly  Seated Long Arc Quad - 15 reps - 3 sets - 1x daily - 7x weekly  Standing Ankle Dorsiflexion Stretch on Chair - 10 reps - 3 sets - 5 hold - 1x daily - 7x weekly  Seated Ankle Eversion AROM - 10 reps - 3 sets - 1x daily - 7x weekly  Seated Ankle Dorsiflexion AROM - 10 reps - 3 sets - 1x daily - 7x weekly  Mini Squat with Counter Support - 10 reps - 3 sets - 1x daily - 7x weekly  Ankle Dorsiflexion with Resistance - 15 reps - 2 sets - 1x daily - 7x weekly  Ankle and Toe Plantarflexion with Resistance - 15 reps - 2 sets - 1x daily - 7x weekly  Ankle Inversion with Resistance - 15 reps - 2 sets - 1x daily - 7x weekly  Ankle Eversion with Resistance - 15 reps - 2 sets - 1x daily - 7x weekly
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
## 2018-05-26 ENCOUNTER — Encounter: Payer: Self-pay | Admitting: Physical Therapy

## 2018-05-26 ENCOUNTER — Ambulatory Visit: Payer: Self-pay | Admitting: Physical Therapy

## 2018-05-26 DIAGNOSIS — M25671 Stiffness of right ankle, not elsewhere classified: Secondary | ICD-10-CM

## 2018-05-26 DIAGNOSIS — R2689 Other abnormalities of gait and mobility: Secondary | ICD-10-CM

## 2018-05-26 DIAGNOSIS — M6281 Muscle weakness (generalized): Secondary | ICD-10-CM
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
## 2018-05-31 ENCOUNTER — Ambulatory Visit (INDEPENDENT_AMBULATORY_CARE_PROVIDER_SITE_OTHER): Payer: Medicaid Other | Admitting: Orthopaedic Surgery

## 2018-05-31 ENCOUNTER — Encounter (INDEPENDENT_AMBULATORY_CARE_PROVIDER_SITE_OTHER): Payer: Self-pay

## 2018-06-01 ENCOUNTER — Ambulatory Visit: Payer: Self-pay | Admitting: Physical Therapy

## 2018-06-01 ENCOUNTER — Encounter: Payer: Self-pay | Admitting: Physical Therapy

## 2018-06-01 DIAGNOSIS — M25671 Stiffness of right ankle, not elsewhere classified: Secondary | ICD-10-CM

## 2018-06-01 DIAGNOSIS — R2689 Other abnormalities of gait and mobility: Secondary | ICD-10-CM

## 2018-06-01 DIAGNOSIS — M6281 Muscle weakness (generalized): Secondary | ICD-10-CM
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
## 2018-06-02 ENCOUNTER — Ambulatory Visit: Payer: Self-pay | Admitting: Physical Therapy

## 2018-06-02 ENCOUNTER — Encounter: Payer: Self-pay | Admitting: Physical Therapy

## 2018-06-02 DIAGNOSIS — R2689 Other abnormalities of gait and mobility: Secondary | ICD-10-CM

## 2018-06-02 DIAGNOSIS — M6281 Muscle weakness (generalized): Secondary | ICD-10-CM

## 2018-06-02 DIAGNOSIS — M25671 Stiffness of right ankle, not elsewhere classified: Secondary | ICD-10-CM
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
## 2018-06-07 ENCOUNTER — Encounter: Payer: Self-pay | Admitting: Physical Therapy

## 2018-06-07 ENCOUNTER — Ambulatory Visit: Payer: Self-pay | Admitting: Physical Therapy

## 2018-06-07 DIAGNOSIS — R2689 Other abnormalities of gait and mobility: Secondary | ICD-10-CM

## 2018-06-07 DIAGNOSIS — M25671 Stiffness of right ankle, not elsewhere classified: Secondary | ICD-10-CM

## 2018-06-07 DIAGNOSIS — M6281 Muscle weakness (generalized): Secondary | ICD-10-CM
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
## 2018-06-09 ENCOUNTER — Encounter: Payer: Self-pay | Admitting: Physical Therapy

## 2018-06-09 ENCOUNTER — Ambulatory Visit: Payer: Self-pay | Admitting: Physical Therapy

## 2018-06-09 DIAGNOSIS — R2689 Other abnormalities of gait and mobility: Secondary | ICD-10-CM

## 2018-06-09 DIAGNOSIS — M25671 Stiffness of right ankle, not elsewhere classified: Secondary | ICD-10-CM

## 2018-06-09 DIAGNOSIS — M6281 Muscle weakness (generalized): Secondary | ICD-10-CM

## 2018-06-09 NOTE — Patient Instructions (Signed)
Access Code: [email or web address]  URL: [email or web address]  Date: 06/09/2018  Prepared by: Donita Brooks   Exercises  Standing Soleus Stretch - 10 reps - 2 sets - 20 hold - 5-6x daily - 7x weekly  Standing Gastroc Stretch - 2 sets - 20 hold - 5-6x daily - 7x weekly  Seated Long Arc Quad - 15 reps - 3 sets - 1x daily - 7x weekly  Standing Ankle Dorsiflexion Stretch on Chair - 10 reps - 3 sets - 5 hold - 1x daily - 7x weekly  Seated Ankle Eversion AROM - 10 reps - 3 sets - 1x daily - 7x weekly  Seated Ankle Dorsiflexion AROM - 10 reps - 3 sets - 1x daily - 7x weekly  Mini Squat with Counter Support - 10 reps - 3 sets - 1x daily - 7x weekly  Ankle Dorsiflexion with Resistance - 15 reps - 2 sets - 1x daily - 7x weekly  Ankle and Toe Plantarflexion with Resistance - 15 reps - 2 sets - 1x daily - 7x weekly  Ankle Inversion with Resistance - 15 reps - 2 sets - 1x daily - 7x weekly  Ankle Eversion with Resistance - 15 reps - 2 sets - 1x daily - 7x weekly    [facility] [address] [place], [place] [address] Phone # [phone] Fax [phone]
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
## 2018-06-15 ENCOUNTER — Encounter: Payer: Self-pay | Admitting: Physical Therapy

## 2018-06-15 ENCOUNTER — Ambulatory Visit: Payer: Self-pay | Attending: Orthopaedic Surgery | Admitting: Physical Therapy

## 2018-06-15 DIAGNOSIS — R2689 Other abnormalities of gait and mobility: Secondary | ICD-10-CM | POA: Insufficient documentation

## 2018-06-15 DIAGNOSIS — M25671 Stiffness of right ankle, not elsewhere classified: Secondary | ICD-10-CM | POA: Insufficient documentation

## 2018-06-15 DIAGNOSIS — M6281 Muscle weakness (generalized): Secondary | ICD-10-CM | POA: Insufficient documentation
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
## 2018-06-16 ENCOUNTER — Ambulatory Visit: Payer: Self-pay | Admitting: Physical Therapy

## 2018-06-16 ENCOUNTER — Encounter: Payer: Self-pay | Admitting: Physical Therapy

## 2018-06-16 DIAGNOSIS — M25671 Stiffness of right ankle, not elsewhere classified: Secondary | ICD-10-CM

## 2018-06-16 DIAGNOSIS — M6281 Muscle weakness (generalized): Secondary | ICD-10-CM

## 2018-06-16 DIAGNOSIS — R2689 Other abnormalities of gait and mobility: Secondary | ICD-10-CM

## 2018-06-16 NOTE — Patient Instructions (Signed)
Trigger Point Dry Needling  . What is Trigger Point Dry Needling (DN)? o DN is a physical therapy technique used to treat muscle pain and dysfunction. Specifically, DN helps deactivate muscle trigger points (muscle knots).  o A thin filiform needle is used to penetrate the skin and stimulate the underlying trigger point. The goal is for a local twitch response (LTR) to occur and for the trigger point to relax. No medication of any kind is injected during the procedure.   . What Does Trigger Point Dry Needling Feel Like?  o The procedure feels different for each individual patient. Some patients report that they do not actually feel the needle enter the skin and overall the process is not painful. Very mild bleeding may occur. However, many patients feel a deep cramping in the muscle in which the needle was inserted. This is the local twitch response.   Marland Kitchen How Will I feel after the treatment? o Soreness is normal, and the onset of soreness may not occur for a few hours. Typically this soreness does not last longer than two days.  o Bruising is uncommon, however; ice can be used to decrease any possible bruising.  o In rare cases feeling tired or nauseous after the treatment is normal. In addition, your symptoms may get worse before they get better, this period will typically not last longer than 24 hours.   . What Can I do After My Treatment? o Increase your hydration by drinking more water for the next 24 hours. o You may place ice or heat on the areas treated that have become sore, however, do not use heat on inflamed or bruised areas. Heat often brings more relief post needling. o You can continue your regular activities, but vigorous activity is not recommended initially after the treatment for 24 hours. o DN is best combined with other physical therapy such as strengthening, stretching, and other therapies.    Adin Hector
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
## 2018-06-21 ENCOUNTER — Encounter: Payer: Self-pay | Admitting: Physical Therapy

## 2018-06-21 ENCOUNTER — Ambulatory Visit: Payer: Self-pay | Admitting: Physical Therapy

## 2018-06-21 DIAGNOSIS — M6281 Muscle weakness (generalized): Secondary | ICD-10-CM

## 2018-06-21 DIAGNOSIS — M25671 Stiffness of right ankle, not elsewhere classified: Secondary | ICD-10-CM

## 2018-06-21 DIAGNOSIS — R2689 Other abnormalities of gait and mobility: Secondary | ICD-10-CM

## 2018-06-21 NOTE — Patient Instructions (Signed)
Access Code: [email or web address]  URL: [email or web address]  Date: 06/21/2018  Prepared by: Donita Brooks   Exercises  Standing Soleus Stretch - 10 reps - 2 sets - 20 hold - 5-6x daily - 7x weekly  Standing Gastroc Stretch - 2 sets - 20 hold - 5-6x daily - 7x weekly  Seated Long Arc Quad - 15 reps - 3 sets - 1x daily - 7x weekly  Standing Ankle Dorsiflexion Stretch on Chair - 10 reps - 3 sets - 5 hold - 1x daily - 7x weekly  Mini Squat with Counter Support - 10 reps - 3 sets - 1x daily - 7x weekly  Ankle Dorsiflexion with Resistance - 15 reps - 2 sets - 1x daily - 7x weekly  Ankle and Toe Plantarflexion with Resistance - 15 reps - 2 sets - 1x daily - 7x weekly  Ankle Inversion with Resistance - 15 reps - 2 sets - 1x daily - 7x weekly  Seated Heel Raise - 10 reps - 2 sets - 2x daily - 7x weekly  Ankle Eversion with Resistance - 15 reps - 2 sets - 1x daily - 7x weekly    [facility] [address], [address] [place], [place] [address] Phone # [phone] Fax [phone]
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
## 2018-06-23 ENCOUNTER — Encounter: Payer: Self-pay | Admitting: Physical Therapy

## 2018-06-23 ENCOUNTER — Ambulatory Visit: Payer: Self-pay | Admitting: Physical Therapy

## 2018-06-23 ENCOUNTER — Encounter (INDEPENDENT_AMBULATORY_CARE_PROVIDER_SITE_OTHER): Payer: Self-pay | Admitting: Orthopaedic Surgery

## 2018-06-23 DIAGNOSIS — R2689 Other abnormalities of gait and mobility: Secondary | ICD-10-CM

## 2018-06-23 DIAGNOSIS — M6281 Muscle weakness (generalized): Secondary | ICD-10-CM

## 2018-06-23 DIAGNOSIS — M25671 Stiffness of right ankle, not elsewhere classified: Secondary | ICD-10-CM
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
## 2018-06-27 ENCOUNTER — Telehealth (INDEPENDENT_AMBULATORY_CARE_PROVIDER_SITE_OTHER): Payer: Self-pay | Admitting: Orthopaedic Surgery

## 2018-06-27 NOTE — Telephone Encounter (Signed)
Called patient in response to My Chart message of rescheduling appointment. Left message to call us back to reschedule.

## 2018-06-28 ENCOUNTER — Telehealth: Payer: Self-pay | Admitting: Physical Therapy

## 2018-06-28 ENCOUNTER — Ambulatory Visit: Payer: Self-pay | Admitting: Physical Therapy

## 2018-06-28 NOTE — Telephone Encounter (Signed)
No show. Attempted to call pt, but went straight to voicemail.    9:45 AM,06/28/18 Donita Brooks PT, DPT [facility]  [phone]

## 2018-06-30 ENCOUNTER — Encounter: Payer: Self-pay | Admitting: Physical Therapy

## 2018-06-30 ENCOUNTER — Ambulatory Visit: Payer: Self-pay | Admitting: Physical Therapy

## 2018-06-30 DIAGNOSIS — R2689 Other abnormalities of gait and mobility: Secondary | ICD-10-CM

## 2018-06-30 DIAGNOSIS — M6281 Muscle weakness (generalized): Secondary | ICD-10-CM

## 2018-06-30 DIAGNOSIS — M25671 Stiffness of right ankle, not elsewhere classified: Secondary | ICD-10-CM

## 2018-06-30 NOTE — Patient Instructions (Signed)
Access Code: [email or web address]  URL: [email or web address]  Date: 06/30/2018  Prepared by: Donita Brooks   Exercises  Standing Soleus Stretch - 10 reps - 2 sets - 20 hold - 5-6x daily - 7x weekly  Standing Gastroc Stretch - 2 sets - 20 hold - 5-6x daily - 7x weekly  Standing Ankle Dorsiflexion Stretch on Chair - 10 reps - 3 sets - 5 hold - 1x daily - 7x weekly  Mini Squat with Counter Support - 10 reps - 3 sets - 1x daily - 7x weekly  Ankle Dorsiflexion with Resistance - 15 reps - 2 sets - 1x daily - 7x weekly  Ankle and Toe Plantarflexion with Resistance - 15 reps - 2 sets - 1x daily - 7x weekly  Ankle Inversion with Resistance - 15 reps - 2 sets - 1x daily - 7x weekly  Seated Heel Raise - 10 reps - 2 sets - 2x daily - 7x weekly  Ankle Eversion with Resistance - 15 reps - 2 sets - 1x daily - 7x weekly    [facility] [address], [address] [place], [place] [address] Phone # [phone] Fax [phone]
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
## 2018-07-05 ENCOUNTER — Ambulatory Visit: Payer: Self-pay | Admitting: Physical Therapy

## 2018-07-05 ENCOUNTER — Encounter: Payer: Self-pay | Admitting: Physical Therapy

## 2018-07-05 DIAGNOSIS — M25671 Stiffness of right ankle, not elsewhere classified: Secondary | ICD-10-CM

## 2018-07-05 DIAGNOSIS — R2689 Other abnormalities of gait and mobility: Secondary | ICD-10-CM

## 2018-07-05 DIAGNOSIS — M6281 Muscle weakness (generalized): Secondary | ICD-10-CM
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
## 2018-07-07 ENCOUNTER — Ambulatory Visit: Payer: Self-pay | Admitting: Physical Therapy

## 2018-07-07 DIAGNOSIS — R2689 Other abnormalities of gait and mobility: Secondary | ICD-10-CM

## 2018-07-07 DIAGNOSIS — M25671 Stiffness of right ankle, not elsewhere classified: Secondary | ICD-10-CM

## 2018-07-07 DIAGNOSIS — M6281 Muscle weakness (generalized): Secondary | ICD-10-CM
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
## 2018-07-12 ENCOUNTER — Encounter: Payer: Self-pay | Admitting: Physical Therapy

## 2018-07-12 ENCOUNTER — Ambulatory Visit: Payer: Self-pay | Attending: Orthopaedic Surgery | Admitting: Physical Therapy

## 2018-07-12 DIAGNOSIS — M25671 Stiffness of right ankle, not elsewhere classified: Secondary | ICD-10-CM | POA: Insufficient documentation

## 2018-07-12 DIAGNOSIS — R2689 Other abnormalities of gait and mobility: Secondary | ICD-10-CM | POA: Insufficient documentation

## 2018-07-12 DIAGNOSIS — M6281 Muscle weakness (generalized): Secondary | ICD-10-CM | POA: Insufficient documentation
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
## 2018-07-12 NOTE — Patient Instructions (Signed)
Access Code: [email or web address]  URL: [email or web address]  Date: 07/12/2018  Prepared by: Donita Brooks   Exercises  Standing Soleus Stretch - 10 reps - 2 sets - 20 hold - 5-6x daily - 7x weekly  Standing Gastroc Stretch - 2 sets - 20 hold - 5-6x daily - 7x weekly  Standing Ankle Dorsiflexion Stretch on Chair - 10 reps - 3 sets - 5 hold - 1x daily - 7x weekly  Mini Squat with Counter Support - 10 reps - 3 sets - 1x daily - 7x weekly  Ankle Dorsiflexion with Resistance - 15 reps - 2 sets - 1x daily - 7x weekly  Ankle Inversion with Resistance - 15 reps - 2 sets - 1x daily - 7x weekly  Heel rises with counter support - 10 reps - 2 sets - 1x daily - 7x weekly  Ankle Eversion with Resistance - 15 reps - 2 sets - 1x daily - 7x weekly    [facility] [address] [place], [place] [address] Phone # [phone] Fax [phone]

## 2018-07-13 ENCOUNTER — Ambulatory Visit (INDEPENDENT_AMBULATORY_CARE_PROVIDER_SITE_OTHER): Payer: Medicaid Other | Admitting: Orthopaedic Surgery

## 2018-07-13 ENCOUNTER — Encounter (INDEPENDENT_AMBULATORY_CARE_PROVIDER_SITE_OTHER): Payer: Self-pay | Admitting: Orthopaedic Surgery

## 2018-07-13 VITALS — BP 125/87 | HR 78 | Ht 67.5 in | Wt 285.0 lb

## 2018-07-13 DIAGNOSIS — S8264XD Nondisplaced fracture of lateral malleolus of right fibula, subsequent encounter for closed fracture with routine healing: Secondary | ICD-10-CM

## 2018-07-13 DIAGNOSIS — S82141D Displaced bicondylar fracture of right tibia, subsequent encounter for closed fracture with routine healing: Secondary | ICD-10-CM

## 2018-07-13 DIAGNOSIS — S52135D Nondisplaced fracture of neck of left radius, subsequent encounter for closed fracture with routine healing: Secondary | ICD-10-CM
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
## 2018-07-25 ENCOUNTER — Other Ambulatory Visit: Payer: Self-pay | Admitting: Family Medicine

## 2018-07-26 ENCOUNTER — Telehealth: Payer: Self-pay | Admitting: Physical Therapy

## 2018-07-26 ENCOUNTER — Ambulatory Visit: Payer: Self-pay | Admitting: Physical Therapy

## 2018-07-26 NOTE — Telephone Encounter (Signed)
No show. LMOM requesting pt return call at her earliest convenience.  9:46 AM,07/26/18 Donita Brooks PT, DPT [facility]  [phone]

## 2018-08-02 ENCOUNTER — Encounter: Payer: Medicaid Other | Admitting: Physical Therapy

## 2018-08-11 ENCOUNTER — Encounter: Payer: Medicaid Other | Admitting: Physical Therapy

## 2018-08-17 ENCOUNTER — Encounter: Payer: Medicaid Other | Admitting: Physical Therapy

## 2018-08-23 ENCOUNTER — Encounter: Payer: Medicaid Other | Admitting: Physical Therapy

## 2018-08-30 ENCOUNTER — Encounter: Payer: Medicaid Other | Admitting: Physical Therapy

## 2018-09-20 ENCOUNTER — Other Ambulatory Visit: Payer: Self-pay

## 2018-09-20 ENCOUNTER — Ambulatory Visit: Payer: Self-pay | Attending: Orthopaedic Surgery | Admitting: Physical Therapy

## 2018-09-20 ENCOUNTER — Encounter: Payer: Self-pay | Admitting: Physical Therapy

## 2018-09-20 DIAGNOSIS — M6281 Muscle weakness (generalized): Secondary | ICD-10-CM | POA: Insufficient documentation

## 2018-09-20 DIAGNOSIS — R2689 Other abnormalities of gait and mobility: Secondary | ICD-10-CM | POA: Insufficient documentation

## 2018-09-20 DIAGNOSIS — M25671 Stiffness of right ankle, not elsewhere classified: Secondary | ICD-10-CM | POA: Insufficient documentation

## 2018-09-20 NOTE — Patient Instructions (Signed)
Access Code: [email or web address]  URL: [email or web address]  Date: 09/20/2018  Prepared by: Donita Brooks   Exercises  Standing Soleus Stretch - 10 reps - 2 sets - 20 hold - 5-6x daily - 7x weekly  Standing Gastroc Stretch - 2 sets - 20 hold - 5-6x daily - 7x weekly  Mini Squat with Counter Support - 10 reps - 3 sets - 1x daily - 7x weekly  Ankle Inversion with Resistance - 20-30 reps - 1x daily - 7x weekly  Single Leg Stance - 10 hold - 3-4x daily - 7x weekly  Single Leg Heel Raise - 5-10 reps - 3-4x daily - 7x weekly  Ankle Eversion with Resistance - 20-30 reps - 1x daily - 7x weekly    [facility] [address] [place], [place] [address] Phone # [phone] Fax [phone]
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
## 2018-09-27 ENCOUNTER — Other Ambulatory Visit: Payer: Self-pay

## 2018-09-27 ENCOUNTER — Encounter: Payer: Self-pay | Admitting: Physical Therapy

## 2018-09-27 ENCOUNTER — Ambulatory Visit: Payer: Self-pay | Admitting: Physical Therapy

## 2018-09-27 DIAGNOSIS — M25671 Stiffness of right ankle, not elsewhere classified: Secondary | ICD-10-CM

## 2018-09-27 DIAGNOSIS — R2689 Other abnormalities of gait and mobility: Secondary | ICD-10-CM
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
## 2018-10-04 ENCOUNTER — Other Ambulatory Visit: Payer: Self-pay

## 2018-10-04 ENCOUNTER — Ambulatory Visit: Payer: Self-pay | Admitting: Physical Therapy

## 2018-10-04 ENCOUNTER — Encounter: Payer: Self-pay | Admitting: Physical Therapy

## 2018-10-04 DIAGNOSIS — M25671 Stiffness of right ankle, not elsewhere classified: Secondary | ICD-10-CM

## 2018-10-04 DIAGNOSIS — R2689 Other abnormalities of gait and mobility: Secondary | ICD-10-CM

## 2018-10-04 DIAGNOSIS — M6281 Muscle weakness (generalized): Secondary | ICD-10-CM

## 2018-10-04 NOTE — Patient Instructions (Signed)
Access Code: [email or web address]  URL: [email or web address]  Date: 10/04/2018  Prepared by: Donita Brooks   Exercises  Standing Gastroc Stretch - 2 sets - 20 hold - 5-6x daily - 7x weekly  Ankle Inversion with Resistance - 20-30 reps - 1x daily - 7x weekly  Single Leg Stance - 10 hold - 3-4x daily - 7x weekly  Single Leg Heel Raise - 5-10 reps - 3-4x daily - 7x weekly  Ankle Eversion with Resistance - 20-30 reps - 1x daily - 7x weekly  Lateral Step Down - 10 reps - 2 sets - 1x daily - 7x weekly  Tandem Walking with Counter Support - 2-3x daily - 7x weekly    [facility] [address] [place], [place] [address] Phone # [phone] Fax [phone]
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
## 2018-10-11 ENCOUNTER — Encounter: Payer: Self-pay | Admitting: Physical Therapy

## 2018-10-11 ENCOUNTER — Other Ambulatory Visit: Payer: Self-pay

## 2018-10-11 ENCOUNTER — Ambulatory Visit: Payer: Self-pay | Attending: Orthopaedic Surgery | Admitting: Physical Therapy

## 2018-10-11 DIAGNOSIS — M25671 Stiffness of right ankle, not elsewhere classified: Secondary | ICD-10-CM | POA: Insufficient documentation

## 2018-10-11 DIAGNOSIS — R2689 Other abnormalities of gait and mobility: Secondary | ICD-10-CM | POA: Insufficient documentation

## 2018-10-11 DIAGNOSIS — M6281 Muscle weakness (generalized): Secondary | ICD-10-CM | POA: Insufficient documentation

## 2018-10-11 NOTE — Patient Instructions (Signed)
Access Code: [email or web address]  URL: [email or web address]  Date: 10/11/2018  Prepared by: Donita Brooks   Exercises  Standing Gastroc Stretch - 2 sets - 20 hold - 5-6x daily - 7x weekly  Ankle Inversion with Resistance - 20-30 reps - 1x daily - 7x weekly  Single Leg Stance - 10 hold - 3-4x daily - 7x weekly  Single Leg Heel Raise - 5-10 reps - 3-4x daily - 7x weekly  Ankle Eversion with Resistance - 20-30 reps - 1x daily - 7x weekly  Lateral Step Down - 10 reps - 2 sets - 1x daily - 7x weekly  Tandem Walking with Counter Support - 2-3x daily - 7x weekly  Standing Ankle Dorsiflexion Stretch - 5 reps - 2 sets - 5 hold - 1x daily - 7x weekly    [facility] [address], [address] [place], [place] [address] Phone # [phone] Fax [phone]
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
## 2018-10-12 ENCOUNTER — Ambulatory Visit: Payer: Self-pay | Admitting: Orthopaedic Surgery

## 2018-10-18 ENCOUNTER — Ambulatory Visit: Payer: Self-pay | Admitting: Physical Therapy

## 2018-10-18 ENCOUNTER — Telehealth: Payer: Self-pay | Admitting: Physical Therapy

## 2018-10-18 NOTE — Telephone Encounter (Signed)
No show. Left voicemail for pt to return call at her earliest convenience.    2:15 PM,10/18/18 [place], [place] [facility] [place]
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
## 2019-03-14 ENCOUNTER — Encounter: Payer: Self-pay | Admitting: Orthopaedic Surgery

## 2019-03-17 ENCOUNTER — Encounter: Payer: Self-pay | Admitting: Orthopaedic Surgery

## 2019-03-17 ENCOUNTER — Ambulatory Visit (INDEPENDENT_AMBULATORY_CARE_PROVIDER_SITE_OTHER): Payer: Self-pay

## 2019-03-17 ENCOUNTER — Ambulatory Visit (INDEPENDENT_AMBULATORY_CARE_PROVIDER_SITE_OTHER): Payer: Self-pay | Admitting: Orthopaedic Surgery

## 2019-03-17 VITALS — BP 110/77 | HR 83 | Ht 67.0 in | Wt 277.0 lb

## 2019-03-17 DIAGNOSIS — M79671 Pain in right foot: Secondary | ICD-10-CM

## 2019-03-17 DIAGNOSIS — Z6841 Body Mass Index (BMI) 40.0 and over, adult: Secondary | ICD-10-CM

## 2019-03-17 DIAGNOSIS — M25571 Pain in right ankle and joints of right foot: Secondary | ICD-10-CM
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
## 2019-03-24 ENCOUNTER — Ambulatory Visit: Payer: Medicaid Other | Admitting: Orthopaedic Surgery

## 2019-05-10 ENCOUNTER — Encounter: Payer: Self-pay | Admitting: Orthopaedic Surgery

## 2019-05-10 DIAGNOSIS — M25571 Pain in right ankle and joints of right foot: Secondary | ICD-10-CM

## 2019-05-12 HISTORY — PX: OTHER SURGICAL HISTORY: SHX169

## 2019-05-18 ENCOUNTER — Telehealth: Payer: Self-pay | Admitting: Radiology
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
## 2019-05-19 ENCOUNTER — Ambulatory Visit: Payer: Medicaid Other | Admitting: Orthopaedic Surgery

## 2019-08-24 ENCOUNTER — Other Ambulatory Visit: Payer: Self-pay

## 2019-08-24 MED ORDER — LEVOTHYROXINE SODIUM 200 MCG PO TABS: 200.0000 ug | ORAL_TABLET | Freq: Every day | ORAL | 0 refills | Status: DC

## 2019-09-19 ENCOUNTER — Other Ambulatory Visit: Payer: Self-pay | Admitting: Family Medicine

## 2019-10-12 ENCOUNTER — Encounter: Payer: Self-pay | Admitting: Orthopaedic Surgery

## 2019-10-12 ENCOUNTER — Telehealth: Payer: Self-pay | Admitting: Orthopaedic Surgery

## 2019-10-12 NOTE — Telephone Encounter (Signed)
I called and spoke with patient. Scheduled her an appointment to see Dr Ophelia Charter on Tuesday. She said she is having a lot of pain. Wanted to know if there was anything that could be sent in for her for pain until she sees Dr Ophelia Charter. Can you please have Fayrene Fearing advise?

## 2019-10-12 NOTE — Telephone Encounter (Signed)
See message below. Thank you

## 2019-10-12 NOTE — Telephone Encounter (Signed)
Patient called.   Experiencing severe hip pain and wants to see if she can be seen sooner than the next available.   Call back: [phone] York Spaniel to please call after 3

## 2019-10-16 ENCOUNTER — Encounter: Payer: Self-pay | Admitting: Orthopaedic Surgery

## 2019-10-17 ENCOUNTER — Ambulatory Visit: Payer: Medicaid Other | Admitting: Orthopaedic Surgery

## 2019-10-24 NOTE — Telephone Encounter (Signed)
She has not been seen in the office since last year.  Cannot prescribe any pain medication.  Needs to be seen.

## 2019-10-24 NOTE — Telephone Encounter (Signed)
Called and left a message for patient to call back .

## 2019-11-10 ENCOUNTER — Other Ambulatory Visit: Payer: Self-pay | Admitting: Family Medicine

## 2020-03-19 ENCOUNTER — Other Ambulatory Visit: Payer: Self-pay | Admitting: Orthopaedic Surgery

## 2020-03-19 ENCOUNTER — Other Ambulatory Visit: Payer: Self-pay

## 2020-03-19 NOTE — Telephone Encounter (Signed)
Sorry , need ROV and xrays , got some norco cough syrup and also ultram in last 30 days from Dr. Abigail Miyamoto, I cannot give her anything without her coming in to be seen .

## 2020-04-10 ENCOUNTER — Encounter: Payer: Self-pay | Admitting: Family Medicine

## 2020-04-10 ENCOUNTER — Ambulatory Visit (INDEPENDENT_AMBULATORY_CARE_PROVIDER_SITE_OTHER): Payer: Medicaid Other | Admitting: Family Medicine

## 2020-04-10 ENCOUNTER — Other Ambulatory Visit: Payer: Self-pay

## 2020-04-10 ENCOUNTER — Other Ambulatory Visit (HOSPITAL_COMMUNITY)
Admission: RE | Admit: 2020-04-10 | Discharge: 2020-04-10 | Disposition: A | Discharge status: Home / Self Care | Payer: Medicaid Other | Source: Ambulatory Visit | Attending: Family Medicine | Admitting: Family Medicine

## 2020-04-10 VITALS — BP 112/79 | HR 77 | Ht 67.0 in | Wt 285.0 lb

## 2020-04-10 DIAGNOSIS — Z803 Family history of malignant neoplasm of breast: Secondary | ICD-10-CM

## 2020-04-10 DIAGNOSIS — E039 Hypothyroidism, unspecified: Secondary | ICD-10-CM | POA: Diagnosis not present

## 2020-04-10 DIAGNOSIS — Z01419 Encounter for gynecological examination (general) (routine) without abnormal findings: Secondary | ICD-10-CM

## 2020-04-10 DIAGNOSIS — N914 Secondary oligomenorrhea: Secondary | ICD-10-CM

## 2020-04-10 MED ORDER — LEVOTHYROXINE SODIUM 200 MCG PO TABS: 200.0000 ug | ORAL_TABLET | Freq: Every day | ORAL | 0 refills | Status: AC

## 2020-04-10 MED ORDER — NORETHINDRONE ACET-ETHINYL EST 1-20 MG-MCG PO TABS: 1.0000 | ORAL_TABLET | Freq: Every day | ORAL | 3 refills | Status: DC

## 2020-04-10 NOTE — Progress Notes (Signed)
GYNECOLOGY ANNUAL PREVENTATIVE CARE ENCOUNTER NOTE  Subjective:   Joan Becker is a 34 y.o. G87P2002 female here for a routine annual gynecologic exam.  Current complaints: none.   Denies abnormal vaginal bleeding, discharge, pelvic pain, problems with intercourse or other gynecologic concerns.    Menstrual interval: every 43 days, bleeds for 5-6 days. This started this past January.  Has been of synthyroid for several months. Does not have PCP due to family planning medicaid. Tired, increased weight gain.  Family history of Breast Cancer: Paternal Grandmother, Paternal aunts.  Gynecologic History Patient's last menstrual period was 03/06/2020. Patient is sexually active  Contraception: condoms - partner getting vasectomy this week Last Pap: uncertain. Results were: normal Last mammogram: n/a.  Obstetric History OB History  Gravida Para Term Preterm AB Living  2 2 2  0 0 2  SAB TAB Ectopic Multiple Live Births  0 0 0 0 2    # Outcome Date GA Lbr Len/2nd Weight Sex Delivery Anes PTL Lv  2 Term 01/07/18 [redacted]w[redacted]d  7 lb 7.2 oz (3.38 kg) F CS-LTranv Spinal  LIV     Birth Comments: No gross anomalies  1 Term 08/18/14 [redacted]w[redacted]d 04:01 / 01:39 7 lb 12.7 oz (3.535 kg) F Vag-Spont EPI  LIV    Past Medical History:  Diagnosis Date  . Abdominal pain in pregnancy, antepartum 06/06/2014  . Gestational diabetes    glyburide  . Hypothyroidism   . Kidney stones   . Obesity   . PCOS (polycystic ovarian syndrome)   . Postpartum care following vaginal delivery (4/9) 08/19/2014  . Thyroid disease     Past Surgical History:  Procedure Laterality Date  . ADENOIDECTOMY    . ANKLE ARTHROSCOPY Right 03/21/2018   Procedure: RIGHT ANKLE ARTHROSCOPY, OSTEOCHONDRAL DEBRIDEMENT, REMOVAL OF LOOSE BODY;  Surgeon: 13/03/2018, MD;  Location: [facility];  Service: Orthopedics;  Laterality: Right;  . CESAREAN SECTION N/A 01/07/2018   Procedure: CESAREAN SECTION;  Surgeon: 01/09/2018,  MD;  [facility];  Service: Obstetrics;  Laterality: N/A;  . DILATION AND CURETTAGE OF UTERUS    . HYSTEROSCOPY WITH D & C N/A 08/11/2012   Procedure: DILATATION AND CURETTAGE /HYSTEROSCOPY;  Surgeon: 10/11/2012, MD;  Location: [facility];  Service: Gynecology;  Laterality: N/A;  . MYRINGOTOMY    . ORIF TIBIA PLATEAU Right 10/26/2017   Procedure: OPEN REDUCTION INTERNAL FIXATION (ORIF) TIBIAL PLATEAU, MEDIAL AND LATERAL CONDYLE FIXATION, RIGHT ANKLE SPLINT APPLICATION;  Surgeon: 10/28/2017, MD;  Location: [facility];  Service: Orthopedics;  Laterality: Right;  . TONSILLECTOMY    . TYMPANOSTOMY TUBE PLACEMENT    . WISDOM TOOTH EXTRACTION      Current Outpatient Medications on File Prior to Visit  Medication Sig Dispense Refill  . amphetamine-dextroamphetamine (ADDERALL) 30 MG tablet TK 1 T PO BID  0  . HYDROcodone-acetaminophen (NORCO) 5-325 MG tablet Take 1-2 tablets by mouth every 4 (four) hours as needed for moderate pain. (Patient not taking: Reported on 05/13/2018) 30 tablet 0  . nystatin (MYCOSTATIN/NYSTOP) powder Apply topically 4 (four) times daily. 30 g 2   No current facility-administered medications on file prior to visit.    No Known Allergies  Social History   Socioeconomic History  . Marital status: Married    Spouse name: Not on file  . Number of children: Not on file  . Years of education: Not on file  . Highest education level: Not on file  Occupational History  . Not on file  Tobacco Use  . Smoking status: Never Smoker  . Smokeless tobacco: Never Used  Vaping Use  . Vaping Use: Never used  Substance and Sexual Activity  . Alcohol use: Not Currently  . Drug use: Never  . Sexual activity: Yes    Birth control/protection: None  Other Topics Concern  . Not on file  Social History Narrative   ** Merged History Encounter **       Social Determinants of Health   Financial Resource Strain:   . Difficulty of Paying Living Expenses: Not on file   Food Insecurity:   . Worried About Programme researcher, broadcasting/film/video in the Last Year: Not on file  . Ran Out of Food in the Last Year: Not on file  Transportation Needs:   . Lack of Transportation (Medical): Not on file  . Lack of Transportation (Non-Medical): Not on file  Physical Activity:   . Days of Exercise per Week: Not on file  . Minutes of Exercise per Session: Not on file  Stress:   . Feeling of Stress : Not on file  Social Connections:   . Frequency of Communication with Friends and Family: Not on file  . Frequency of Social Gatherings with Friends and Family: Not on file  . Attends Religious Services: Not on file  . Active Member of Clubs or Organizations: Not on file  . Attends Banker Meetings: Not on file  . Marital Status: Not on file  Intimate Partner Violence:   . Fear of Current or Ex-Partner: Not on file  . Emotionally Abused: Not on file  . Physically Abused: Not on file  . Sexually Abused: Not on file    Family History  Problem Relation Age of Onset  . Diabetes Father   . Hypertension Father   . Cancer Neg Hx     The following portions of the patient's history were reviewed and updated as appropriate: allergies, current medications, past family history, past medical history, past social history, past surgical history and problem list.  Review of Systems Pertinent items are noted in HPI.   Objective:  BP 112/79   Pulse 77   Ht 5\' 7"  (1.702 m)   Wt 285 lb (129.3 kg)   LMP 03/06/2020   BMI 44.64 kg/m  Wt Readings from Last 3 Encounters:  04/10/20 285 lb (129.3 kg)  03/17/19 277 lb (125.6 kg)  07/13/18 285 lb (129.3 kg)     Chaperone present during exam  CONSTITUTIONAL: Well-developed, well-nourished female in no acute distress.  HENT:  Normocephalic, atraumatic, External right and left ear normal. Oropharynx is clear and moist EYES: Conjunctivae and EOM are normal. Pupils are equal, round, and reactive to light. No scleral icterus.  NECK:  Normal range of motion, supple, no masses.  Normal thyroid.   CARDIOVASCULAR: Normal heart rate noted, regular rhythm RESPIRATORY: Clear to auscultation bilaterally. Effort and breath sounds normal, no problems with respiration noted. BREASTS: Symmetric in size. No masses, skin changes, nipple drainage, or lymphadenopathy. ABDOMEN: Soft, normal bowel sounds, no distention noted.  No tenderness, rebound or guarding.  PELVIC: Normal appearing external genitalia; normal appearing vaginal mucosa and cervix.  No abnormal discharge noted.   MUSCULOSKELETAL: Normal range of motion. No tenderness.  No cyanosis, clubbing, or edema.  2+ distal pulses. SKIN: Skin is warm and dry. No rash noted. Not diaphoretic. No erythema. No pallor. NEUROLOGIC: Alert and oriented to person, place, and time. Normal reflexes,  muscle tone coordination. No cranial nerve deficit noted. PSYCHIATRIC: Normal mood and affect. Normal behavior. Normal judgment and thought content.  Assessment:  Annual gynecologic examination with pap smear   Plan:  1. Well Woman Exam Will follow up results of pap smear and manage accordingly. STD testing discussed. Patient declined testing - Cytology - PAP( [facility]) - TSH - Hemoglobin A1c  2. Secondary oligomenorrhea Increased interval. Possibly because of uncontrolled hypothyroidism. Check   3. Acquired hypothyroidism Check TSH. Restart synthroid. Will need to repeat TSH in 6 weeks. - TSH   Routine preventative health maintenance measures emphasized. Please refer to After Visit Summary for other counseling recommendations.    Candelaria Celeste, DO [facility]

## 2020-04-11 LAB — HEMOGLOBIN A1C
Est. average glucose Bld gHb Est-mCnc: 97 mg/dL
Hgb A1c MFr Bld: 5 % (ref 4.8–5.6)

## 2020-04-11 LAB — TSH: TSH: 24.4 u[IU]/mL — ABNORMAL HIGH (ref 0.450–4.500)

## 2020-04-12 LAB — CYTOLOGY - PAP
Comment: NEGATIVE
Diagnosis: NEGATIVE
High risk HPV: NEGATIVE

## 2020-05-22 ENCOUNTER — Other Ambulatory Visit: Payer: Medicaid Other

## 2020-05-30 ENCOUNTER — Telehealth: Payer: Self-pay

## 2020-05-30 NOTE — Telephone Encounter (Signed)
Spoke with pt about rescheduling appt.  Pt hung up the phone due to being upset about having to move the appt.  I offered the pt an appt Tuesday at 2:00 on her

## 2020-05-31 ENCOUNTER — Ambulatory Visit: Payer: Medicaid Other | Admitting: Orthopaedic Surgery

## 2020-07-02 ENCOUNTER — Telehealth: Payer: Self-pay

## 2020-07-02 NOTE — Telephone Encounter (Signed)
Patient called she is requesting her records and xray's on a disc patient stated she will come to pick up records and disc tomorrow morning and she will also fill out a medical records release form as well call back:[phone]

## 2020-07-02 NOTE — Telephone Encounter (Signed)
Noted. Message already sent to Tammy and Lawson Fiscal.

## 2020-07-03 NOTE — Telephone Encounter (Signed)
CD made and up front for when she picks up. Thanks

## 2020-07-03 NOTE — Telephone Encounter (Signed)
FYI

## 2020-07-04 NOTE — Telephone Encounter (Signed)
Will prepare when Berkley Harvey is received

## 2020-10-21 DIAGNOSIS — M24571 Contracture, right ankle: Secondary | ICD-10-CM | POA: Insufficient documentation

## 2020-10-21 DIAGNOSIS — M25471 Effusion, right ankle: Secondary | ICD-10-CM | POA: Insufficient documentation

## 2020-10-21 DIAGNOSIS — S86301A Unspecified injury of muscle(s) and tendon(s) of peroneal muscle group at lower leg level, right leg, initial encounter: Secondary | ICD-10-CM | POA: Insufficient documentation

## 2020-10-21 DIAGNOSIS — M25871 Other specified joint disorders, right ankle and foot: Secondary | ICD-10-CM | POA: Insufficient documentation

## 2021-04-11 ENCOUNTER — Ambulatory Visit: Payer: Medicaid Other | Admitting: Family Medicine

## 2021-04-11 ENCOUNTER — Encounter: Payer: Medicaid Other | Admitting: Family Medicine

## 2021-04-29 ENCOUNTER — Ambulatory Visit: Payer: Medicaid Other | Admitting: Advanced Practice Midwife

## 2021-05-30 ENCOUNTER — Emergency Department (HOSPITAL_BASED_OUTPATIENT_CLINIC_OR_DEPARTMENT_OTHER): Payer: Medicaid Other

## 2021-05-30 ENCOUNTER — Emergency Department (HOSPITAL_BASED_OUTPATIENT_CLINIC_OR_DEPARTMENT_OTHER): Payer: Medicaid Other | Admitting: Radiology

## 2021-05-30 ENCOUNTER — Encounter (HOSPITAL_BASED_OUTPATIENT_CLINIC_OR_DEPARTMENT_OTHER): Payer: Self-pay | Admitting: Emergency Medicine

## 2021-05-30 ENCOUNTER — Other Ambulatory Visit: Payer: Self-pay

## 2021-05-30 ENCOUNTER — Emergency Department (HOSPITAL_BASED_OUTPATIENT_CLINIC_OR_DEPARTMENT_OTHER)
Admission: EM | Admit: 2021-05-30 | Discharge: 2021-05-30 | Disposition: A | Discharge status: Home / Self Care | Payer: Medicaid Other | Attending: Emergency Medicine | Admitting: Emergency Medicine

## 2021-05-30 DIAGNOSIS — R0602 Shortness of breath: Secondary | ICD-10-CM | POA: Insufficient documentation

## 2021-05-30 DIAGNOSIS — R079 Chest pain, unspecified: Secondary | ICD-10-CM

## 2021-05-30 DIAGNOSIS — R0789 Other chest pain: Secondary | ICD-10-CM | POA: Insufficient documentation

## 2021-05-30 DIAGNOSIS — R002 Palpitations: Secondary | ICD-10-CM | POA: Insufficient documentation

## 2021-05-30 LAB — CBC
HCT: 38 % (ref 36.0–46.0)
Hemoglobin: 11.6 g/dL — ABNORMAL LOW (ref 12.0–15.0)
MCH: 23.8 pg — ABNORMAL LOW (ref 26.0–34.0)
MCHC: 30.5 g/dL (ref 30.0–36.0)
MCV: 78 fL — ABNORMAL LOW (ref 80.0–100.0)
Platelets: 326 10*3/uL (ref 150–400)
RBC: 4.87 MIL/uL (ref 3.87–5.11)
RDW: 17.6 % — ABNORMAL HIGH (ref 11.5–15.5)
WBC: 8.1 10*3/uL (ref 4.0–10.5)
nRBC: 0 % (ref 0.0–0.2)

## 2021-05-30 LAB — BASIC METABOLIC PANEL
Anion gap: 8 (ref 5–15)
BUN: 20 mg/dL (ref 6–20)
CO2: 25 mmol/L (ref 22–32)
Calcium: 9.3 mg/dL (ref 8.9–10.3)
Chloride: 104 mmol/L (ref 98–111)
Creatinine, Ser: 0.75 mg/dL (ref 0.44–1.00)
GFR, Estimated: >60 mL/min (ref >60)
Glucose, Bld: 121 mg/dL — ABNORMAL HIGH (ref 70–99)
Potassium: 4 mmol/L (ref 3.5–5.1)
Sodium: 137 mmol/L (ref 135–145)

## 2021-05-30 LAB — TROPONIN I (HIGH SENSITIVITY): Troponin I (High Sensitivity): <2 ng/L (ref <18)

## 2021-05-30 LAB — PREGNANCY, URINE: Preg Test, Ur: NEGATIVE

## 2021-05-30 MED ORDER — IOHEXOL 350 MG/ML SOLN
100.0000 mL | Freq: Once | INTRAVENOUS | Status: AC | PRN
  Administered 2021-05-30: 100 mL via INTRAVENOUS

## 2021-05-30 MED ORDER — ONDANSETRON HCL 4 MG/2ML IJ SOLN
4.0000 mg | Freq: Once | INTRAMUSCULAR | Status: AC
  Administered 2021-05-30: 4 mg via INTRAVENOUS
  Filled 2021-05-30: qty 2

## 2021-05-30 MED ORDER — KETOROLAC TROMETHAMINE 30 MG/ML IJ SOLN
30.0000 mg | Freq: Once | INTRAMUSCULAR | Status: AC
  Administered 2021-05-30: 30 mg via INTRAVENOUS
  Filled 2021-05-30: qty 1

## 2021-05-30 NOTE — ED Triage Notes (Signed)
°  Patient comes in with mid-sternal chest pain that woke her up from sleeping.  Patient states that she woke up about an hour ago with pain radiating to her L shoulder, and felt her heart racing.  Patient states she felt SOB and thought it was anxiety.  Patient took 2 81 mg aspirins and came to be evaluated.  Nausea with no vomiting.  No diaphoresis. No jaw/arm pain.  Pain 6/10.  Patient states she recently had surgery and has no been mobile as much as she needs to be.

## 2021-05-30 NOTE — ED Provider Notes (Signed)
[facility] EMERGENCY DEPT Provider Note   CSN: [id number] Arrival date & time: 05/30/21  0112     History  Chief Complaint  Patient presents with   Chest Pain   Nausea    Joan Becker is a 36 y.o. female.   Chest Pain Pain location:  L chest Pain quality: radiating and sharp   Pain radiates to:  L shoulder Pain severity:  Mild Timing:  Constant Chronicity:  New Relieved by:  None tried Worsened by:  Nothing Ineffective treatments:  None tried Associated symptoms: no abdominal pain, no dizziness and no fever       Home Medications Prior to Admission medications   Medication Sig Start Date End Date Taking? Authorizing Provider  amphetamine-dextroamphetamine (ADDERALL) 30 MG tablet TK 1 T PO BID 02/15/18   [provider]  HYDROcodone-acetaminophen (NORCO) 5-325 MG tablet Take 1-2 tablets by mouth every 4 (four) hours as needed for moderate pain. Patient not taking: Reported on 05/13/2018 03/21/18   Eldred Manges, MD  levothyroxine (SYNTHROID) 200 MCG tablet Take 1 tablet (200 mcg total) by mouth daily before breakfast. 04/10/20   Levie Heritage, DO  norethindrone-ethinyl estradiol (LOESTRIN 1/20, 21,) 1-20 MG-MCG tablet Take 1 tablet by mouth daily. 04/10/20   Levie Heritage, DO  nystatin (MYCOSTATIN/NYSTOP) powder Apply topically 4 (four) times daily. 01/27/18   Levie Heritage, DO      Allergies    Patient has no known allergies.    Review of Systems   Review of Systems  Constitutional:  Negative for fever.  Cardiovascular:  Positive for chest pain.  Gastrointestinal:  Negative for abdominal pain.  Neurological:  Negative for dizziness.  All other systems reviewed and are negative.  Physical Exam Updated Vital Signs BP (!) 111/51 (BP Location: Left Arm)    Pulse 70    Temp 98 F (36.7 C) (Oral)    Resp 18    Ht 5\' 7"  (1.702 m)    Wt 131.5 kg    LMP 05/05/2021    SpO2 99%    BMI 45.42 kg/m  Physical Exam Vitals and nursing note  reviewed.  Constitutional:      Appearance: She is well-developed.  HENT:     Head: Normocephalic and atraumatic.  Cardiovascular:     Rate and Rhythm: Normal rate and regular rhythm.  Pulmonary:     Effort: No respiratory distress.     Breath sounds: No stridor. No decreased breath sounds or wheezing.  Abdominal:     General: There is no distension.     Palpations: Abdomen is soft.  Musculoskeletal:        General: Normal range of motion.     Cervical back: Normal range of motion.  Skin:    General: Skin is warm and dry.  Neurological:     Mental Status: She is alert.    ED Results / Procedures / Treatments   Labs (all labs ordered are listed, but only abnormal results are displayed) Labs Reviewed  BASIC METABOLIC PANEL - Abnormal; Notable for the following components:      Result Value   Glucose, Bld 121 (*)    All other components within normal limits  CBC - Abnormal; Notable for the following components:   Hemoglobin 11.6 (*)    MCV 78.0 (*)    MCH 23.8 (*)    RDW 17.6 (*)    All other components within normal limits  PREGNANCY, URINE  TROPONIN I (HIGH SENSITIVITY)  TROPONIN I (HIGH SENSITIVITY)    EKG EKG Interpretation  Date/Time:  Friday May 30 2021 01:23:57 EST Ventricular Rate:  88 PR Interval:  172 QRS Duration: 88 QT Interval:  364 QTC Calculation: 440 R Axis:   43 Text Interpretation: Sinus rhythm with Premature atrial complexes Inferior infarct , age undetermined Abnormal ECG No previous ECGs available Confirmed by Marily MemosMesner, Leala Bryand [phone]([id number]) on 05/30/2021 2:21:20 AM  Radiology DG Chest 2 View  Result Date: 05/30/2021 CLINICAL DATA:  Midsternal chest pain. EXAM: CHEST - 2 VIEW COMPARISON:  October 26, 2017 FINDINGS: The heart size and mediastinal contours are within normal limits. Low lung volumes are seen. Both lungs are clear. The visualized skeletal structures are unremarkable. IMPRESSION: No active cardiopulmonary disease. Electronically Signed   By:  Aram Candelahaddeus  Houston M.D.   On: 05/30/2021 01:48   CT Angio Chest PE W and/or Wo Contrast  Result Date: 05/30/2021 CLINICAL DATA:  Midsternal chest pain, initial encounter EXAM: CT ANGIOGRAPHY CHEST WITH CONTRAST TECHNIQUE: Multidetector CT imaging of the chest was performed using the standard protocol during bolus administration of intravenous contrast. Multiplanar CT image reconstructions and MIPs were obtained to evaluate the vascular anatomy. RADIATION DOSE REDUCTION: This exam was performed according to the departmental dose-optimization program which includes automated exposure control, adjustment of the mA and/or kV according to patient size and/or use of iterative reconstruction technique. CONTRAST:  100mL OMNIPAQUE IOHEXOL 350 MG/ML SOLN COMPARISON:  Chest x-ray from earlier in the same day. FINDINGS: Cardiovascular: Thoracic aorta shows a normal branching pattern. No aneurysmal dilatation or dissection is noted. No cardiac enlargement is seen. No coronary calcifications are seen. The pulmonary artery shows a normal branching pattern without evidence of intraluminal filling defect to suggest pulmonary embolism. Mediastinum/Nodes: Thoracic inlet is within normal limits. No sizable hilar or mediastinal adenopathy is noted. The esophagus as visualized is within normal limits. Lungs/Pleura: Lungs are well aerated bilaterally. No focal infiltrate or sizable effusion is seen. No parenchymal nodules are noted. Upper Abdomen: Visualized upper abdomen shows no acute abnormality. Musculoskeletal: Mild degenerative changes of the thoracic spine are noted. No acute rib abnormality is seen. Review of the MIP images confirms the above findings. IMPRESSION: No evidence of pulmonary emboli. No acute abnormality seen. Electronically Signed   By: Alcide CleverMark  Lukens M.D.   On: 05/30/2021 03:30    Procedures Procedures    Medications Ordered in ED Medications  ondansetron (ZOFRAN) injection 4 mg (4 mg Intravenous Given  05/30/21 0255)  ketorolac (TORADOL) 30 MG/ML injection 30 mg (30 mg Intravenous Given 05/30/21 0256)  iohexol (OMNIPAQUE) 350 MG/ML injection 100 mL (100 mLs Intravenous Contrast Given 05/30/21 [id number])    ED Course/ Medical Decision Making/ A&P                           Medical Decision Making Amount and/or Complexity of Data Reviewed Labs: ordered. Radiology: ordered.  Risk Prescription drug management.   36 year old female who presents the emergency room today after her a couple episodes of left-sided chest burning and pain that radiated to her left shoulder.  Associated with shortness of breath and palpitations.  She states that she recently had a surgery on her right ankle back in December and has been in a boot since then is worried about blood clots that she has not been as mobile as normal.  She has not noticed any swelling in her legs at any worse in fact she thinks it might of gotten  better.  Patient states that she has never had a blood clot before.  She denies any diaphoresis, vomiting, lightheadedness or other symptoms but did have some nausea with it.  She does have a history of indigestion but this does not feel anything like that.  Her EKG reviewed by me shows S1 every 3 but no T3.  Is not tachycardic she is not tachypneic or hypoxic however I do not have another obvious cause for her symptoms and with the mobility of her legs she has a couple risk factors so CT scan done and did not show any evidence (as reviewed by me and interpreted by radiology) of pulmonary embolus.  No evidence of pneumonia.  Her troponin was negative making the chance of ACS very unlikely as is being on for multiple hours prior to coming in.  At this time I do not know exactly what caused her symptoms.  She will follow-up with her primary doctor for further work-up and management.  Final Clinical Impression(s) / ED Diagnoses Final diagnoses:  Nonspecific chest pain    Rx / DC Orders ED Discharge Orders      None         Hattie Aguinaldo, Barbara Cower, MD 05/30/21 [phone]

## 2021-06-25 ENCOUNTER — Other Ambulatory Visit: Payer: Self-pay

## 2021-06-25 ENCOUNTER — Ambulatory Visit: Payer: No Typology Code available for payment source | Attending: Orthopedic Surgery

## 2021-06-25 DIAGNOSIS — M25571 Pain in right ankle and joints of right foot: Secondary | ICD-10-CM | POA: Diagnosis not present

## 2021-06-25 DIAGNOSIS — M25471 Effusion, right ankle: Secondary | ICD-10-CM | POA: Insufficient documentation

## 2021-06-25 DIAGNOSIS — M25871 Other specified joint disorders, right ankle and foot: Secondary | ICD-10-CM | POA: Insufficient documentation

## 2021-06-25 DIAGNOSIS — R2689 Other abnormalities of gait and mobility: Secondary | ICD-10-CM | POA: Diagnosis not present

## 2021-06-25 DIAGNOSIS — M25671 Stiffness of right ankle, not elsewhere classified: Secondary | ICD-10-CM | POA: Diagnosis not present
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
## 2021-07-02 ENCOUNTER — Ambulatory Visit: Payer: No Typology Code available for payment source

## 2021-07-02 ENCOUNTER — Other Ambulatory Visit: Payer: Self-pay

## 2021-07-02 DIAGNOSIS — R2689 Other abnormalities of gait and mobility: Secondary | ICD-10-CM

## 2021-07-02 DIAGNOSIS — M25671 Stiffness of right ankle, not elsewhere classified: Secondary | ICD-10-CM

## 2021-07-02 DIAGNOSIS — M25571 Pain in right ankle and joints of right foot: Secondary | ICD-10-CM
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
## 2021-07-04 ENCOUNTER — Other Ambulatory Visit: Payer: Self-pay

## 2021-07-04 ENCOUNTER — Ambulatory Visit: Payer: No Typology Code available for payment source | Admitting: Physical Therapy

## 2021-07-04 DIAGNOSIS — M25671 Stiffness of right ankle, not elsewhere classified: Secondary | ICD-10-CM

## 2021-07-04 DIAGNOSIS — R2689 Other abnormalities of gait and mobility: Secondary | ICD-10-CM | POA: Diagnosis not present

## 2021-07-04 DIAGNOSIS — M25571 Pain in right ankle and joints of right foot: Secondary | ICD-10-CM
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
## 2021-07-09 ENCOUNTER — Other Ambulatory Visit: Payer: Self-pay

## 2021-07-09 ENCOUNTER — Ambulatory Visit: Payer: No Typology Code available for payment source | Attending: Orthopedic Surgery

## 2021-07-09 DIAGNOSIS — M25671 Stiffness of right ankle, not elsewhere classified: Secondary | ICD-10-CM | POA: Diagnosis not present

## 2021-07-09 DIAGNOSIS — R2689 Other abnormalities of gait and mobility: Secondary | ICD-10-CM | POA: Diagnosis present

## 2021-07-09 DIAGNOSIS — M25571 Pain in right ankle and joints of right foot: Secondary | ICD-10-CM | POA: Diagnosis present
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
## 2021-07-11 ENCOUNTER — Ambulatory Visit: Payer: No Typology Code available for payment source | Admitting: Physical Therapy

## 2021-07-11 ENCOUNTER — Other Ambulatory Visit: Payer: Self-pay

## 2021-07-11 DIAGNOSIS — R2689 Other abnormalities of gait and mobility: Secondary | ICD-10-CM

## 2021-07-11 DIAGNOSIS — M25571 Pain in right ankle and joints of right foot: Secondary | ICD-10-CM

## 2021-07-11 DIAGNOSIS — M25671 Stiffness of right ankle, not elsewhere classified: Secondary | ICD-10-CM
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
## 2021-07-15 ENCOUNTER — Ambulatory Visit: Payer: No Typology Code available for payment source

## 2021-07-15 ENCOUNTER — Other Ambulatory Visit: Payer: Self-pay

## 2021-07-15 DIAGNOSIS — R2689 Other abnormalities of gait and mobility: Secondary | ICD-10-CM

## 2021-07-15 DIAGNOSIS — M25571 Pain in right ankle and joints of right foot: Secondary | ICD-10-CM

## 2021-07-15 DIAGNOSIS — M25671 Stiffness of right ankle, not elsewhere classified: Secondary | ICD-10-CM
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
## 2021-07-18 ENCOUNTER — Other Ambulatory Visit: Payer: Self-pay

## 2021-07-18 ENCOUNTER — Ambulatory Visit: Payer: No Typology Code available for payment source | Admitting: Physical Therapy

## 2021-07-18 DIAGNOSIS — M25671 Stiffness of right ankle, not elsewhere classified: Secondary | ICD-10-CM | POA: Diagnosis not present

## 2021-07-18 DIAGNOSIS — M25571 Pain in right ankle and joints of right foot: Secondary | ICD-10-CM

## 2021-07-18 DIAGNOSIS — R2689 Other abnormalities of gait and mobility: Secondary | ICD-10-CM
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
## 2021-07-23 ENCOUNTER — Ambulatory Visit: Payer: No Typology Code available for payment source

## 2021-07-23 ENCOUNTER — Other Ambulatory Visit: Payer: Self-pay

## 2021-07-23 DIAGNOSIS — R2689 Other abnormalities of gait and mobility: Secondary | ICD-10-CM

## 2021-07-23 DIAGNOSIS — M25671 Stiffness of right ankle, not elsewhere classified: Secondary | ICD-10-CM

## 2021-07-23 DIAGNOSIS — M25571 Pain in right ankle and joints of right foot: Secondary | ICD-10-CM
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
## 2021-07-25 ENCOUNTER — Ambulatory Visit: Payer: No Typology Code available for payment source

## 2021-07-25 ENCOUNTER — Other Ambulatory Visit: Payer: Self-pay

## 2021-07-25 DIAGNOSIS — M25671 Stiffness of right ankle, not elsewhere classified: Secondary | ICD-10-CM | POA: Diagnosis not present

## 2021-07-25 DIAGNOSIS — R2689 Other abnormalities of gait and mobility: Secondary | ICD-10-CM

## 2021-07-25 DIAGNOSIS — M25571 Pain in right ankle and joints of right foot: Secondary | ICD-10-CM
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
## 2021-07-30 ENCOUNTER — Other Ambulatory Visit: Payer: Self-pay

## 2021-07-30 ENCOUNTER — Ambulatory Visit: Payer: No Typology Code available for payment source

## 2021-07-30 DIAGNOSIS — M25671 Stiffness of right ankle, not elsewhere classified: Secondary | ICD-10-CM

## 2021-07-30 DIAGNOSIS — M25571 Pain in right ankle and joints of right foot: Secondary | ICD-10-CM

## 2021-07-30 DIAGNOSIS — R2689 Other abnormalities of gait and mobility: Secondary | ICD-10-CM
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
## 2021-08-01 ENCOUNTER — Ambulatory Visit: Payer: No Typology Code available for payment source

## 2021-08-01 ENCOUNTER — Other Ambulatory Visit: Payer: Self-pay

## 2021-08-01 DIAGNOSIS — M25671 Stiffness of right ankle, not elsewhere classified: Secondary | ICD-10-CM | POA: Diagnosis not present

## 2021-08-01 DIAGNOSIS — R2689 Other abnormalities of gait and mobility: Secondary | ICD-10-CM

## 2021-08-01 DIAGNOSIS — M25571 Pain in right ankle and joints of right foot: Secondary | ICD-10-CM
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
## 2021-08-06 ENCOUNTER — Other Ambulatory Visit: Payer: Self-pay

## 2021-08-06 ENCOUNTER — Ambulatory Visit: Payer: No Typology Code available for payment source

## 2021-08-06 DIAGNOSIS — M25671 Stiffness of right ankle, not elsewhere classified: Secondary | ICD-10-CM | POA: Diagnosis not present

## 2021-08-06 DIAGNOSIS — M25571 Pain in right ankle and joints of right foot: Secondary | ICD-10-CM

## 2021-08-06 DIAGNOSIS — R2689 Other abnormalities of gait and mobility: Secondary | ICD-10-CM
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
## 2021-08-08 ENCOUNTER — Ambulatory Visit: Payer: No Typology Code available for payment source

## 2021-08-08 DIAGNOSIS — M25671 Stiffness of right ankle, not elsewhere classified: Secondary | ICD-10-CM

## 2021-08-08 DIAGNOSIS — R2689 Other abnormalities of gait and mobility: Secondary | ICD-10-CM

## 2021-08-08 DIAGNOSIS — M25571 Pain in right ankle and joints of right foot: Secondary | ICD-10-CM
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
## 2021-08-13 ENCOUNTER — Ambulatory Visit: Payer: No Typology Code available for payment source | Attending: Orthopedic Surgery

## 2021-08-13 DIAGNOSIS — R252 Cramp and spasm: Secondary | ICD-10-CM | POA: Insufficient documentation

## 2021-08-13 DIAGNOSIS — R2689 Other abnormalities of gait and mobility: Secondary | ICD-10-CM | POA: Insufficient documentation

## 2021-08-13 DIAGNOSIS — M25671 Stiffness of right ankle, not elsewhere classified: Secondary | ICD-10-CM | POA: Diagnosis present

## 2021-08-13 DIAGNOSIS — M25571 Pain in right ankle and joints of right foot: Secondary | ICD-10-CM | POA: Insufficient documentation
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
## 2021-08-14 ENCOUNTER — Ambulatory Visit: Payer: No Typology Code available for payment source

## 2021-08-14 DIAGNOSIS — R2689 Other abnormalities of gait and mobility: Secondary | ICD-10-CM

## 2021-08-14 DIAGNOSIS — M25671 Stiffness of right ankle, not elsewhere classified: Secondary | ICD-10-CM | POA: Diagnosis not present

## 2021-08-14 DIAGNOSIS — M25571 Pain in right ankle and joints of right foot: Secondary | ICD-10-CM
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
## 2021-08-15 ENCOUNTER — Encounter: Payer: Medicaid Other | Admitting: Physical Therapy

## 2021-08-20 ENCOUNTER — Encounter: Payer: Medicaid Other | Admitting: Physical Therapy

## 2021-08-27 ENCOUNTER — Ambulatory Visit: Payer: No Typology Code available for payment source

## 2021-08-27 DIAGNOSIS — M25671 Stiffness of right ankle, not elsewhere classified: Secondary | ICD-10-CM | POA: Diagnosis not present

## 2021-08-27 DIAGNOSIS — R2689 Other abnormalities of gait and mobility: Secondary | ICD-10-CM

## 2021-08-27 DIAGNOSIS — M25571 Pain in right ankle and joints of right foot: Secondary | ICD-10-CM
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
## 2021-08-29 ENCOUNTER — Ambulatory Visit: Payer: No Typology Code available for payment source

## 2021-08-29 DIAGNOSIS — R2689 Other abnormalities of gait and mobility: Secondary | ICD-10-CM

## 2021-08-29 DIAGNOSIS — M25571 Pain in right ankle and joints of right foot: Secondary | ICD-10-CM

## 2021-08-29 DIAGNOSIS — M25671 Stiffness of right ankle, not elsewhere classified: Secondary | ICD-10-CM | POA: Diagnosis not present

## 2021-08-29 DIAGNOSIS — R252 Cramp and spasm: Secondary | ICD-10-CM
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
## 2021-09-03 ENCOUNTER — Ambulatory Visit: Payer: No Typology Code available for payment source

## 2021-09-03 DIAGNOSIS — R2689 Other abnormalities of gait and mobility: Secondary | ICD-10-CM

## 2021-09-03 DIAGNOSIS — M25571 Pain in right ankle and joints of right foot: Secondary | ICD-10-CM

## 2021-09-03 DIAGNOSIS — M25671 Stiffness of right ankle, not elsewhere classified: Secondary | ICD-10-CM

## 2021-09-03 DIAGNOSIS — R252 Cramp and spasm: Secondary | ICD-10-CM
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
## 2021-09-05 ENCOUNTER — Ambulatory Visit: Payer: No Typology Code available for payment source | Admitting: Physical Therapy

## 2021-09-05 DIAGNOSIS — M25571 Pain in right ankle and joints of right foot: Secondary | ICD-10-CM

## 2021-09-05 DIAGNOSIS — M25671 Stiffness of right ankle, not elsewhere classified: Secondary | ICD-10-CM

## 2021-09-05 DIAGNOSIS — R2689 Other abnormalities of gait and mobility: Secondary | ICD-10-CM
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
## 2021-09-10 ENCOUNTER — Ambulatory Visit: Payer: Self-pay | Attending: Orthopedic Surgery

## 2021-09-10 DIAGNOSIS — M25571 Pain in right ankle and joints of right foot: Secondary | ICD-10-CM | POA: Diagnosis present

## 2021-09-10 DIAGNOSIS — M25671 Stiffness of right ankle, not elsewhere classified: Secondary | ICD-10-CM

## 2021-09-10 DIAGNOSIS — R2689 Other abnormalities of gait and mobility: Secondary | ICD-10-CM | POA: Diagnosis present

## 2021-09-10 DIAGNOSIS — R252 Cramp and spasm: Secondary | ICD-10-CM
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
## 2021-09-12 ENCOUNTER — Encounter: Payer: Self-pay | Admitting: Physical Therapy

## 2021-09-12 ENCOUNTER — Ambulatory Visit: Payer: Self-pay | Admitting: Physical Therapy

## 2021-09-12 DIAGNOSIS — R252 Cramp and spasm: Secondary | ICD-10-CM

## 2021-09-12 DIAGNOSIS — M25671 Stiffness of right ankle, not elsewhere classified: Secondary | ICD-10-CM

## 2021-09-12 DIAGNOSIS — M25571 Pain in right ankle and joints of right foot: Secondary | ICD-10-CM

## 2021-09-12 DIAGNOSIS — R2689 Other abnormalities of gait and mobility: Secondary | ICD-10-CM
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
## 2021-09-24 ENCOUNTER — Ambulatory Visit: Payer: Self-pay

## 2021-09-24 DIAGNOSIS — R2689 Other abnormalities of gait and mobility: Secondary | ICD-10-CM

## 2021-09-24 DIAGNOSIS — M25671 Stiffness of right ankle, not elsewhere classified: Secondary | ICD-10-CM

## 2021-09-24 DIAGNOSIS — R252 Cramp and spasm: Secondary | ICD-10-CM

## 2021-09-24 DIAGNOSIS — M25571 Pain in right ankle and joints of right foot: Secondary | ICD-10-CM
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
## 2021-09-26 ENCOUNTER — Ambulatory Visit: Payer: Self-pay | Admitting: Physical Therapy

## 2021-09-26 DIAGNOSIS — M25671 Stiffness of right ankle, not elsewhere classified: Secondary | ICD-10-CM | POA: Diagnosis not present

## 2021-09-26 DIAGNOSIS — M25571 Pain in right ankle and joints of right foot: Secondary | ICD-10-CM

## 2021-09-26 DIAGNOSIS — R2689 Other abnormalities of gait and mobility: Secondary | ICD-10-CM
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
## 2021-10-01 ENCOUNTER — Ambulatory Visit: Payer: Medicaid Other

## 2021-10-01 DIAGNOSIS — R252 Cramp and spasm: Secondary | ICD-10-CM

## 2021-10-01 DIAGNOSIS — M25671 Stiffness of right ankle, not elsewhere classified: Secondary | ICD-10-CM

## 2021-10-01 DIAGNOSIS — M25571 Pain in right ankle and joints of right foot: Secondary | ICD-10-CM

## 2021-10-01 DIAGNOSIS — R2689 Other abnormalities of gait and mobility: Secondary | ICD-10-CM
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
## 2021-10-03 ENCOUNTER — Ambulatory Visit: Payer: Medicaid Other

## 2021-10-03 DIAGNOSIS — R2689 Other abnormalities of gait and mobility: Secondary | ICD-10-CM

## 2021-10-03 DIAGNOSIS — M25671 Stiffness of right ankle, not elsewhere classified: Secondary | ICD-10-CM

## 2021-10-03 DIAGNOSIS — R252 Cramp and spasm: Secondary | ICD-10-CM

## 2021-10-03 DIAGNOSIS — M25571 Pain in right ankle and joints of right foot: Secondary | ICD-10-CM
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
## 2021-10-08 ENCOUNTER — Ambulatory Visit: Payer: Medicaid Other

## 2021-10-08 DIAGNOSIS — R252 Cramp and spasm: Secondary | ICD-10-CM

## 2021-10-08 DIAGNOSIS — M25571 Pain in right ankle and joints of right foot: Secondary | ICD-10-CM

## 2021-10-08 DIAGNOSIS — M25671 Stiffness of right ankle, not elsewhere classified: Secondary | ICD-10-CM

## 2021-10-08 DIAGNOSIS — R2689 Other abnormalities of gait and mobility: Secondary | ICD-10-CM
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
## 2021-10-10 ENCOUNTER — Ambulatory Visit: Payer: Self-pay

## 2021-10-13 ENCOUNTER — Encounter: Payer: Self-pay | Admitting: Family Medicine

## 2021-10-13 ENCOUNTER — Ambulatory Visit: Payer: Medicaid Other | Admitting: Family Medicine

## 2021-10-13 NOTE — Progress Notes (Signed)
Patient did not keep appointment today. She may call to reschedule.  

## 2021-10-14 ENCOUNTER — Ambulatory Visit: Payer: Self-pay | Attending: Orthopedic Surgery

## 2021-10-14 DIAGNOSIS — M25671 Stiffness of right ankle, not elsewhere classified: Secondary | ICD-10-CM | POA: Diagnosis present

## 2021-10-14 DIAGNOSIS — R252 Cramp and spasm: Secondary | ICD-10-CM

## 2021-10-14 DIAGNOSIS — M6281 Muscle weakness (generalized): Secondary | ICD-10-CM | POA: Diagnosis present

## 2021-10-14 DIAGNOSIS — M25571 Pain in right ankle and joints of right foot: Secondary | ICD-10-CM

## 2021-10-14 DIAGNOSIS — R2689 Other abnormalities of gait and mobility: Secondary | ICD-10-CM | POA: Diagnosis present
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
## 2021-10-17 ENCOUNTER — Ambulatory Visit: Payer: Self-pay | Admitting: Physical Therapy

## 2021-10-17 ENCOUNTER — Encounter: Payer: Self-pay | Admitting: Physical Therapy

## 2021-10-17 DIAGNOSIS — M25571 Pain in right ankle and joints of right foot: Secondary | ICD-10-CM

## 2021-10-17 DIAGNOSIS — R252 Cramp and spasm: Secondary | ICD-10-CM

## 2021-10-17 DIAGNOSIS — M25671 Stiffness of right ankle, not elsewhere classified: Secondary | ICD-10-CM

## 2021-10-17 DIAGNOSIS — R2689 Other abnormalities of gait and mobility: Secondary | ICD-10-CM
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
## 2021-10-20 ENCOUNTER — Ambulatory Visit: Payer: Self-pay | Admitting: Physical Therapy

## 2021-10-20 DIAGNOSIS — M25671 Stiffness of right ankle, not elsewhere classified: Secondary | ICD-10-CM

## 2021-10-20 DIAGNOSIS — R2689 Other abnormalities of gait and mobility: Secondary | ICD-10-CM

## 2021-10-20 DIAGNOSIS — R252 Cramp and spasm: Secondary | ICD-10-CM

## 2021-10-20 DIAGNOSIS — M25571 Pain in right ankle and joints of right foot: Secondary | ICD-10-CM

## 2021-10-22 NOTE — Addendum Note (Signed)
Addended by: Mikey Kirschner B on: 10/22/2021 08:19 AM   Modules accepted: Orders

## 2021-10-24 ENCOUNTER — Ambulatory Visit: Payer: Medicaid Other | Admitting: Physical Therapy

## 2021-10-24 ENCOUNTER — Encounter: Payer: Self-pay | Admitting: Physical Therapy

## 2021-10-24 DIAGNOSIS — M25671 Stiffness of right ankle, not elsewhere classified: Secondary | ICD-10-CM

## 2021-10-24 DIAGNOSIS — M25571 Pain in right ankle and joints of right foot: Secondary | ICD-10-CM

## 2021-10-24 DIAGNOSIS — R252 Cramp and spasm: Secondary | ICD-10-CM

## 2021-10-24 DIAGNOSIS — R2689 Other abnormalities of gait and mobility: Secondary | ICD-10-CM
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
## 2021-10-29 ENCOUNTER — Ambulatory Visit: Payer: Self-pay

## 2021-10-29 DIAGNOSIS — M25671 Stiffness of right ankle, not elsewhere classified: Secondary | ICD-10-CM | POA: Diagnosis not present

## 2021-10-29 DIAGNOSIS — R2689 Other abnormalities of gait and mobility: Secondary | ICD-10-CM

## 2021-10-29 DIAGNOSIS — R252 Cramp and spasm: Secondary | ICD-10-CM

## 2021-10-29 DIAGNOSIS — M25571 Pain in right ankle and joints of right foot: Secondary | ICD-10-CM
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
## 2021-10-31 ENCOUNTER — Ambulatory Visit: Payer: Medicaid Other

## 2021-10-31 DIAGNOSIS — R2689 Other abnormalities of gait and mobility: Secondary | ICD-10-CM

## 2021-10-31 DIAGNOSIS — M25571 Pain in right ankle and joints of right foot: Secondary | ICD-10-CM

## 2021-10-31 DIAGNOSIS — M25671 Stiffness of right ankle, not elsewhere classified: Secondary | ICD-10-CM

## 2021-10-31 DIAGNOSIS — R252 Cramp and spasm: Secondary | ICD-10-CM

## 2021-10-31 DIAGNOSIS — M6281 Muscle weakness (generalized): Secondary | ICD-10-CM

## 2021-11-07 ENCOUNTER — Ambulatory Visit: Payer: Self-pay
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
## 2021-11-10 ENCOUNTER — Ambulatory Visit: Payer: Medicaid Other | Attending: Orthopedic Surgery | Admitting: Physical Therapy

## 2021-11-10 ENCOUNTER — Encounter: Payer: Self-pay | Admitting: Physical Therapy

## 2021-11-10 DIAGNOSIS — R252 Cramp and spasm: Secondary | ICD-10-CM | POA: Insufficient documentation

## 2021-11-10 DIAGNOSIS — M25571 Pain in right ankle and joints of right foot: Secondary | ICD-10-CM | POA: Insufficient documentation

## 2021-11-10 DIAGNOSIS — M6281 Muscle weakness (generalized): Secondary | ICD-10-CM | POA: Insufficient documentation

## 2021-11-10 DIAGNOSIS — R2689 Other abnormalities of gait and mobility: Secondary | ICD-10-CM | POA: Diagnosis present

## 2021-11-10 DIAGNOSIS — M25671 Stiffness of right ankle, not elsewhere classified: Secondary | ICD-10-CM | POA: Diagnosis present

## 2021-11-13 ENCOUNTER — Ambulatory Visit: Payer: Self-pay | Admitting: Physical Therapy

## 2021-11-21 ENCOUNTER — Ambulatory Visit: Payer: Self-pay

## 2021-12-26 ENCOUNTER — Ambulatory Visit: Payer: Medicaid Other | Attending: Orthopedic Surgery

## 2021-12-26 DIAGNOSIS — R252 Cramp and spasm: Secondary | ICD-10-CM | POA: Insufficient documentation

## 2021-12-26 DIAGNOSIS — M6281 Muscle weakness (generalized): Secondary | ICD-10-CM | POA: Insufficient documentation

## 2021-12-26 DIAGNOSIS — R2689 Other abnormalities of gait and mobility: Secondary | ICD-10-CM | POA: Insufficient documentation

## 2021-12-26 DIAGNOSIS — M25571 Pain in right ankle and joints of right foot: Secondary | ICD-10-CM | POA: Insufficient documentation

## 2021-12-26 DIAGNOSIS — M25671 Stiffness of right ankle, not elsewhere classified: Secondary | ICD-10-CM | POA: Insufficient documentation
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
## 2022-01-14 ENCOUNTER — Ambulatory Visit: Payer: Self-pay | Attending: Orthopedic Surgery

## 2022-01-14 DIAGNOSIS — R2689 Other abnormalities of gait and mobility: Secondary | ICD-10-CM | POA: Insufficient documentation

## 2022-01-14 DIAGNOSIS — M25571 Pain in right ankle and joints of right foot: Secondary | ICD-10-CM | POA: Diagnosis present

## 2022-01-14 DIAGNOSIS — R252 Cramp and spasm: Secondary | ICD-10-CM | POA: Diagnosis present

## 2022-01-14 DIAGNOSIS — M6281 Muscle weakness (generalized): Secondary | ICD-10-CM | POA: Insufficient documentation

## 2022-01-14 DIAGNOSIS — M25671 Stiffness of right ankle, not elsewhere classified: Secondary | ICD-10-CM | POA: Diagnosis present
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
## 2022-01-20 ENCOUNTER — Ambulatory Visit: Payer: Self-pay

## 2022-01-20 DIAGNOSIS — M25571 Pain in right ankle and joints of right foot: Secondary | ICD-10-CM | POA: Diagnosis not present

## 2022-01-20 DIAGNOSIS — R252 Cramp and spasm: Secondary | ICD-10-CM

## 2022-01-20 DIAGNOSIS — M6281 Muscle weakness (generalized): Secondary | ICD-10-CM

## 2022-01-20 DIAGNOSIS — R2689 Other abnormalities of gait and mobility: Secondary | ICD-10-CM

## 2022-01-20 DIAGNOSIS — M25671 Stiffness of right ankle, not elsewhere classified: Secondary | ICD-10-CM
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
## 2022-01-22 ENCOUNTER — Ambulatory Visit: Payer: Self-pay

## 2022-01-22 DIAGNOSIS — M25571 Pain in right ankle and joints of right foot: Secondary | ICD-10-CM

## 2022-01-22 DIAGNOSIS — M6281 Muscle weakness (generalized): Secondary | ICD-10-CM

## 2022-01-22 DIAGNOSIS — M25671 Stiffness of right ankle, not elsewhere classified: Secondary | ICD-10-CM

## 2022-01-22 DIAGNOSIS — R252 Cramp and spasm: Secondary | ICD-10-CM

## 2022-01-22 DIAGNOSIS — R2689 Other abnormalities of gait and mobility: Secondary | ICD-10-CM
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
## 2022-01-27 ENCOUNTER — Ambulatory Visit: Payer: Self-pay

## 2022-01-27 DIAGNOSIS — M6281 Muscle weakness (generalized): Secondary | ICD-10-CM

## 2022-01-27 DIAGNOSIS — M25571 Pain in right ankle and joints of right foot: Secondary | ICD-10-CM

## 2022-01-27 DIAGNOSIS — R2689 Other abnormalities of gait and mobility: Secondary | ICD-10-CM

## 2022-01-27 DIAGNOSIS — M25671 Stiffness of right ankle, not elsewhere classified: Secondary | ICD-10-CM

## 2022-01-27 DIAGNOSIS — R252 Cramp and spasm: Secondary | ICD-10-CM
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
## 2022-01-29 ENCOUNTER — Ambulatory Visit: Payer: Medicaid Other

## 2022-01-29 DIAGNOSIS — M6281 Muscle weakness (generalized): Secondary | ICD-10-CM

## 2022-01-29 DIAGNOSIS — M25571 Pain in right ankle and joints of right foot: Secondary | ICD-10-CM

## 2022-01-29 DIAGNOSIS — M25671 Stiffness of right ankle, not elsewhere classified: Secondary | ICD-10-CM

## 2022-01-29 DIAGNOSIS — R252 Cramp and spasm: Secondary | ICD-10-CM

## 2022-01-29 DIAGNOSIS — R2689 Other abnormalities of gait and mobility: Secondary | ICD-10-CM
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
## 2022-02-03 ENCOUNTER — Ambulatory Visit: Payer: Self-pay

## 2022-02-05 ENCOUNTER — Ambulatory Visit: Payer: Medicaid Other

## 2022-02-05 DIAGNOSIS — R252 Cramp and spasm: Secondary | ICD-10-CM

## 2022-02-05 DIAGNOSIS — M6281 Muscle weakness (generalized): Secondary | ICD-10-CM

## 2022-02-05 DIAGNOSIS — M25571 Pain in right ankle and joints of right foot: Secondary | ICD-10-CM | POA: Diagnosis not present

## 2022-02-05 DIAGNOSIS — M25671 Stiffness of right ankle, not elsewhere classified: Secondary | ICD-10-CM

## 2022-02-05 DIAGNOSIS — R2689 Other abnormalities of gait and mobility: Secondary | ICD-10-CM
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
## 2022-02-10 ENCOUNTER — Ambulatory Visit: Payer: Self-pay | Attending: Orthopedic Surgery

## 2022-02-10 DIAGNOSIS — R252 Cramp and spasm: Secondary | ICD-10-CM | POA: Diagnosis present

## 2022-02-10 DIAGNOSIS — R2689 Other abnormalities of gait and mobility: Secondary | ICD-10-CM | POA: Insufficient documentation

## 2022-02-10 DIAGNOSIS — M6281 Muscle weakness (generalized): Secondary | ICD-10-CM | POA: Diagnosis present

## 2022-02-10 DIAGNOSIS — M25571 Pain in right ankle and joints of right foot: Secondary | ICD-10-CM | POA: Diagnosis present

## 2022-02-10 DIAGNOSIS — M25671 Stiffness of right ankle, not elsewhere classified: Secondary | ICD-10-CM | POA: Insufficient documentation
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
## 2022-02-12 ENCOUNTER — Ambulatory Visit: Payer: Self-pay

## 2022-02-17 ENCOUNTER — Ambulatory Visit: Payer: Self-pay

## 2022-02-19 ENCOUNTER — Ambulatory Visit: Payer: Self-pay

## 2022-06-12 DIAGNOSIS — Z87828 Personal history of other (healed) physical injury and trauma: Secondary | ICD-10-CM | POA: Insufficient documentation

## 2022-06-12 DIAGNOSIS — Z87442 Personal history of urinary calculi: Secondary | ICD-10-CM | POA: Insufficient documentation

## 2022-06-24 DIAGNOSIS — D649 Anemia, unspecified: Secondary | ICD-10-CM | POA: Insufficient documentation

## 2022-07-24 ENCOUNTER — Encounter: Payer: Medicaid Other | Admitting: Obstetrics and Gynecology

## 2022-09-14 ENCOUNTER — Ambulatory Visit (INDEPENDENT_AMBULATORY_CARE_PROVIDER_SITE_OTHER): Payer: Medicaid Other | Admitting: Obstetrics and Gynecology

## 2022-09-14 ENCOUNTER — Encounter: Payer: Self-pay | Admitting: Obstetrics and Gynecology

## 2022-09-14 VITALS — BP 123/59 | HR 90 | Wt 304.0 lb

## 2022-09-14 DIAGNOSIS — N939 Abnormal uterine and vaginal bleeding, unspecified: Secondary | ICD-10-CM | POA: Diagnosis not present

## 2022-09-14 MED ORDER — MEGESTROL ACETATE 40 MG PO TABS: 40.0000 mg | ORAL_TABLET | Freq: Two times a day (BID) | ORAL | 5 refills | Status: DC

## 2022-09-14 NOTE — Progress Notes (Unsigned)
NEW GYNECOLOGY PATIENT Patient name: Joan Becker MRN [id number]  Date of birth: April 23, 1986 Chief Complaint:   Menstrual Problem     History:  Joan Becker is a 37 y.o. G2P2002 being seen today for AUB.  Worsening over the last 3 months. Changing a tampon every 2 hours and has been changing tampons and passed a large clot. 10 years ago Lasting longer but not a lot of pain, mostly the bleeding is the issues Has not had issues sinc eher D&C 10years ago   Husband had a vasectomy  CHC contraindication: none  Previously had IUD and had bleeding       Gynecologic History Patient's last menstrual period was 08/29/2022. Contraception: {method:5051} Last Pap: ***. Result was {norm/abn:16337} with negative HPV Last Mammogram: ***.  Result was {norm/abn:16337} Last Colonoscopy: ***.  Result was {norm/abn:16337}  Obstetric History OB History  Gravida Para Term Preterm AB Living  2 2 2  0 0 2  SAB IAB Ectopic Multiple Live Births  0 0 0 0 2    # Outcome Date GA Lbr Len/2nd Weight Sex Delivery Anes PTL Lv  2 Term 01/07/18 [redacted]w[redacted]d  7 lb 7.2 oz (3.38 kg) F CS-LTranv Spinal  LIV     Birth Comments: No gross anomalies  1 Term 08/18/14 [redacted]w[redacted]d 04:01 / 01:39 7 lb 12.7 oz (3.535 kg) F Vag-Spont EPI  LIV    Past Medical History:  Diagnosis Date   Abdominal pain in pregnancy, antepartum 06/06/2014   Gestational diabetes    glyburide   Hypothyroidism    Kidney stones    Obesity    PCOS (polycystic ovarian syndrome)    Postpartum care following vaginal delivery (4/9) 08/19/2014   Thyroid disease     Past Surgical History:  Procedure Laterality Date   ADENOIDECTOMY     ANKLE ARTHROSCOPY Right 03/21/2018   Procedure: RIGHT ANKLE ARTHROSCOPY, OSTEOCHONDRAL DEBRIDEMENT, REMOVAL OF LOOSE BODY;  Surgeon: Eldred Manges, MD;  Location: [facility];  Service: Orthopedics;  Laterality: Right;   CESAREAN SECTION N/A 01/07/2018   Procedure: CESAREAN SECTION;  Surgeon: Adam Phenix,  MD;  Location: [facility];  Service: Obstetrics;  Laterality: N/A;   DILATION AND CURETTAGE OF UTERUS     HYSTEROSCOPY WITH D & C N/A 08/11/2012   Procedure: DILATATION AND CURETTAGE /HYSTEROSCOPY;  Surgeon: Adam Phenix, MD;  Location: [facility];  Service: Gynecology;  Laterality: N/A;   MYRINGOTOMY     ORIF TIBIA PLATEAU Right 10/26/2017   Procedure: OPEN REDUCTION INTERNAL FIXATION (ORIF) TIBIAL PLATEAU, MEDIAL AND LATERAL CONDYLE FIXATION, RIGHT ANKLE SPLINT APPLICATION;  Surgeon: Eldred Manges, MD;  Location: [facility];  Service: Orthopedics;  Laterality: Right;   TONSILLECTOMY     TYMPANOSTOMY TUBE PLACEMENT     WISDOM TOOTH EXTRACTION      Current Outpatient Medications on File Prior to Visit  Medication Sig Dispense Refill   amphetamine-dextroamphetamine (ADDERALL) 30 MG tablet TK 1 T PO BID  0   levothyroxine (SYNTHROID) 200 MCG tablet Take 1 tablet (200 mcg total) by mouth daily before breakfast. 90 tablet 0   VITAMIN D, CHOLECALCIFEROL, PO Take by mouth. Patient is taking 62952 units daily.     HYDROcodone-acetaminophen (NORCO) 5-325 MG tablet Take 1-2 tablets by mouth every 4 (four) hours as needed for moderate pain. (Patient not taking: Reported on 05/13/2018) 30 tablet 0   norethindrone-ethinyl estradiol (LOESTRIN 1/20, 21,) 1-20 MG-MCG tablet Take 1 tablet by mouth daily. (Patient not  taking: Reported on 06/25/2021) 84 tablet 3   nystatin (MYCOSTATIN/NYSTOP) powder Apply topically 4 (four) times daily. (Patient not taking: Reported on 06/25/2021) 30 g 2   No current facility-administered medications on file prior to visit.    No Known Allergies  Social History:  reports that she has never smoked. She has never used smokeless tobacco. She reports that she does not currently use alcohol. She reports that she does not use drugs.  Family History  Problem Relation Age of Onset   Diabetes Father    Hypertension Father    Cancer Neg Hx     The following portions of the  patient's history were reviewed and updated as appropriate: allergies, current medications, past family history, past medical history, past social history, past surgical history and problem list.  Review of Systems Pertinent items noted in HPI and remainder of comprehensive ROS otherwise negative.  Physical Exam:  BP (!) 123/59   Pulse 90   Wt (!) 304 lb (137.9 kg)   LMP 08/29/2022   BMI 47.61 kg/m  Physical Exam     Assessment and Plan:   There are no diagnoses linked to this encounter.   Routine preventative health maintenance measures emphasized. Please refer to After Visit Summary for other counseling recommendations.   Follow-up: No follow-ups on file.      Joan Shire, MD Obstetrician & Gynecologist, Faculty Practice [facility]vasive Gynecologic Surgery [facility] for [employer], [facility]

## 2022-09-14 NOTE — Progress Notes (Unsigned)
Patient states she had her period 08/16/22-08/23/22 then it started again on 08/29/22.  Joan Becker l Gurkaran Rahm, CMA

## 2022-09-15 ENCOUNTER — Ambulatory Visit (HOSPITAL_BASED_OUTPATIENT_CLINIC_OR_DEPARTMENT_OTHER)
Admission: RE | Admit: 2022-09-15 | Discharge: 2022-09-15 | Disposition: A | Discharge status: Home / Self Care | Payer: Medicaid Other | Source: Ambulatory Visit | Attending: Obstetrics and Gynecology | Admitting: Obstetrics and Gynecology

## 2022-09-15 ENCOUNTER — Encounter: Payer: Self-pay | Admitting: Obstetrics and Gynecology

## 2022-09-15 DIAGNOSIS — N939 Abnormal uterine and vaginal bleeding, unspecified: Secondary | ICD-10-CM | POA: Insufficient documentation

## 2022-09-15 LAB — CBC
Hematocrit: 35.3 % (ref 34.0–46.6)
Hemoglobin: 11.2 g/dL (ref 11.1–15.9)
MCH: 26.2 pg — ABNORMAL LOW (ref 26.6–33.0)
MCHC: 31.7 g/dL (ref 31.5–35.7)
MCV: 83 fL (ref 79–97)
Platelets: 287 10*3/uL (ref 150–450)
RBC: 4.28 x10E6/uL (ref 3.77–5.28)
RDW: 19 % — ABNORMAL HIGH (ref 11.7–15.4)
WBC: 6.7 10*3/uL (ref 3.4–10.8)

## 2022-09-16 ENCOUNTER — Encounter: Payer: Self-pay | Admitting: Obstetrics and Gynecology

## 2022-09-16 ENCOUNTER — Telehealth: Payer: Self-pay

## 2022-09-16 NOTE — Telephone Encounter (Signed)
Patient called requesting [place] results. Patient states her bleeding is lighter but she is having more pain. Patient states she can not take ibuprofen due to her previous surgery. Patient made aware that a message will be sent to the provider. Understanding was voiced. Stevin Bielinski l Mykel Mohl, CMA

## 2022-09-30 ENCOUNTER — Telehealth: Payer: Self-pay

## 2022-09-30 NOTE — Telephone Encounter (Signed)
Patient called to find out how long it takes to receive a call from the surgery scheduler. Patient made aware that a message will be sent to the provider. Understanding was voiced. Alfons Sulkowski l Shaleigh Laubscher, CMA

## 2022-10-01 ENCOUNTER — Telehealth: Payer: Self-pay

## 2022-10-01 NOTE — Telephone Encounter (Signed)
Made contact with patient to discuss surgery dates available for procedure with Dr. Briscoe Deutscher. Patient chose to have surgery on 10/28/22 at 10 am @ [facility]. Provided patient the date, time , location and pre-op instructions. Patient is aware she must arrive at [facility] by 8 am on 10/28/22.

## 2022-10-08 ENCOUNTER — Encounter (HOSPITAL_BASED_OUTPATIENT_CLINIC_OR_DEPARTMENT_OTHER): Payer: Self-pay | Admitting: Obstetrics and Gynecology

## 2022-10-16 ENCOUNTER — Telehealth: Payer: Self-pay

## 2022-10-16 NOTE — Telephone Encounter (Signed)
Patient has not returned Pre-op's call. I reached out to patient to confirm surgery for 10/28/22 and to let her know to call Pre-Op. No answer, left voicemail.

## 2022-10-19 ENCOUNTER — Other Ambulatory Visit: Payer: Self-pay

## 2022-10-19 ENCOUNTER — Encounter (HOSPITAL_BASED_OUTPATIENT_CLINIC_OR_DEPARTMENT_OTHER): Payer: Self-pay | Admitting: Obstetrics and Gynecology

## 2022-10-19 DIAGNOSIS — G4730 Sleep apnea, unspecified: Secondary | ICD-10-CM

## 2022-10-19 HISTORY — DX: Sleep apnea, unspecified: G47.30

## 2022-10-26 ENCOUNTER — Encounter: Payer: Self-pay | Admitting: Obstetrics and Gynecology

## 2022-10-28 ENCOUNTER — Ambulatory Visit (HOSPITAL_BASED_OUTPATIENT_CLINIC_OR_DEPARTMENT_OTHER)
Admission: RE | Admit: 2022-10-28 | Discharge: 2022-10-28 | Disposition: A | Discharge status: Home / Self Care | Payer: Medicaid Other | Attending: Obstetrics and Gynecology | Admitting: Obstetrics and Gynecology

## 2022-10-28 ENCOUNTER — Other Ambulatory Visit: Payer: Self-pay

## 2022-10-28 ENCOUNTER — Ambulatory Visit (HOSPITAL_BASED_OUTPATIENT_CLINIC_OR_DEPARTMENT_OTHER): Payer: Medicaid Other | Admitting: Anesthesiology

## 2022-10-28 ENCOUNTER — Encounter (HOSPITAL_BASED_OUTPATIENT_CLINIC_OR_DEPARTMENT_OTHER): Payer: Self-pay | Admitting: Obstetrics and Gynecology

## 2022-10-28 ENCOUNTER — Encounter (HOSPITAL_BASED_OUTPATIENT_CLINIC_OR_DEPARTMENT_OTHER)
Admission: RE | Disposition: A | Discharge status: Home / Self Care | Payer: Medicaid Other | Source: Home / Self Care | Attending: Obstetrics and Gynecology

## 2022-10-28 DIAGNOSIS — N939 Abnormal uterine and vaginal bleeding, unspecified: Secondary | ICD-10-CM | POA: Insufficient documentation

## 2022-10-28 DIAGNOSIS — Z01818 Encounter for other preprocedural examination: Secondary | ICD-10-CM

## 2022-10-28 DIAGNOSIS — E039 Hypothyroidism, unspecified: Secondary | ICD-10-CM

## 2022-10-28 DIAGNOSIS — G4730 Sleep apnea, unspecified: Secondary | ICD-10-CM | POA: Diagnosis not present

## 2022-10-28 DIAGNOSIS — N840 Polyp of corpus uteri: Secondary | ICD-10-CM | POA: Diagnosis not present

## 2022-10-28 DIAGNOSIS — E119 Type 2 diabetes mellitus without complications: Secondary | ICD-10-CM

## 2022-10-28 HISTORY — PX: DILATATION & CURETTAGE/HYSTEROSCOPY WITH MYOSURE: SHX6511

## 2022-10-28 HISTORY — DX: Abnormal uterine and vaginal bleeding, unspecified: N93.9

## 2022-10-28 HISTORY — DX: Personal history of urinary calculi: Z87.442

## 2022-10-28 LAB — POCT PREGNANCY, URINE: Preg Test, Ur: NEGATIVE

## 2022-10-28 SURGERY — DILATATION & CURETTAGE/HYSTEROSCOPY WITH MYOSURE
Anesthesia: General

## 2022-10-28 MED ORDER — PROPOFOL 10 MG/ML IV BOLUS
INTRAVENOUS | Status: DC | PRN
  Administered 2022-10-28: 60 mg via INTRAVENOUS
  Administered 2022-10-28: 200 mg via INTRAVENOUS
  Administered 2022-10-28: 40 mg via INTRAVENOUS

## 2022-10-28 MED ORDER — BUPIVACAINE HCL 0.5 % IJ SOLN
INTRAMUSCULAR | Status: DC | PRN
  Administered 2022-10-28: 20 mL

## 2022-10-28 MED ORDER — ONDANSETRON HCL 4 MG/2ML IJ SOLN
INTRAMUSCULAR | Status: DC | PRN
  Administered 2022-10-28: 4 mg via INTRAVENOUS

## 2022-10-28 MED ORDER — DEXAMETHASONE SODIUM PHOSPHATE 4 MG/ML IJ SOLN
INTRAMUSCULAR | Status: DC | PRN
  Administered 2022-10-28: 10 mg via INTRAVENOUS

## 2022-10-28 MED ORDER — FENTANYL CITRATE (PF) 100 MCG/2ML IJ SOLN: 25.0000 ug | INTRAMUSCULAR | Status: DC | PRN

## 2022-10-28 MED ORDER — SODIUM CHLORIDE 0.9 % IR SOLN
Status: DC | PRN
  Administered 2022-10-28: 3000 mL

## 2022-10-28 MED ORDER — MIDAZOLAM HCL 2 MG/2ML IJ SOLN
INTRAMUSCULAR | Status: DC | PRN
  Administered 2022-10-28: 2 mg via INTRAVENOUS

## 2022-10-28 MED ORDER — LACTATED RINGERS IV SOLN: INTRAVENOUS | Status: DC

## 2022-10-28 MED ORDER — DEXAMETHASONE SODIUM PHOSPHATE 10 MG/ML IJ SOLN
INTRAMUSCULAR | Status: AC
  Filled 2022-10-28: qty 1

## 2022-10-28 MED ORDER — ACETAMINOPHEN 500 MG PO TABS
1000.0000 mg | ORAL_TABLET | ORAL | Status: AC
  Administered 2022-10-28: 1000 mg via ORAL

## 2022-10-28 MED ORDER — MIDAZOLAM HCL 2 MG/2ML IJ SOLN
INTRAMUSCULAR | Status: AC
  Filled 2022-10-28: qty 2

## 2022-10-28 MED ORDER — CELECOXIB 200 MG PO CAPS
ORAL_CAPSULE | ORAL | Status: AC
  Filled 2022-10-28: qty 1

## 2022-10-28 MED ORDER — ACETAMINOPHEN 500 MG PO TABS
ORAL_TABLET | ORAL | Status: AC
  Filled 2022-10-28: qty 2

## 2022-10-28 MED ORDER — FENTANYL CITRATE (PF) 100 MCG/2ML IJ SOLN
INTRAMUSCULAR | Status: AC
  Filled 2022-10-28: qty 2

## 2022-10-28 MED ORDER — CELECOXIB 200 MG PO CAPS
200.0000 mg | ORAL_CAPSULE | Freq: Once | ORAL | Status: AC
  Administered 2022-10-28: 200 mg via ORAL

## 2022-10-28 MED ORDER — LIDOCAINE HCL (CARDIAC) PF 100 MG/5ML IV SOSY
PREFILLED_SYRINGE | INTRAVENOUS | Status: DC | PRN
  Administered 2022-10-28: 50 mg via INTRAVENOUS

## 2022-10-28 MED ORDER — FENTANYL CITRATE (PF) 100 MCG/2ML IJ SOLN
INTRAMUSCULAR | Status: DC | PRN
  Administered 2022-10-28: 50 ug via INTRAVENOUS

## 2022-10-28 MED ORDER — ONDANSETRON HCL 4 MG/2ML IJ SOLN
INTRAMUSCULAR | Status: AC
  Filled 2022-10-28: qty 2

## 2022-10-28 SURGICAL SUPPLY — 14 items
DEVICE MYOSURE REACH (MISCELLANEOUS) IMPLANT
GLOVE BIO SURGEON STRL SZ7 (GLOVE) ×2 IMPLANT
GLOVE BIOGEL PI IND STRL 7.0 (GLOVE) IMPLANT
GOWN STRL REUS W/ TWL XL LVL3 (GOWN DISPOSABLE) IMPLANT
GOWN STRL REUS W/TWL LRG LVL3 (GOWN DISPOSABLE) IMPLANT
GOWN STRL REUS W/TWL XL LVL3 (GOWN DISPOSABLE) ×1
IV NS IRRIG 3000ML ARTHROMATIC (IV SOLUTION) IMPLANT
KIT PROCEDURE FLUENT (KITS) ×2 IMPLANT
KIT TURNOVER CYSTO (KITS) ×2 IMPLANT
PACK VAGINAL MINOR WOMEN LF (CUSTOM PROCEDURE TRAY) ×2 IMPLANT
PAD OB MATERNITY 4.3X12.25 (PERSONAL CARE ITEMS) ×2 IMPLANT
SEAL ROD LENS SCOPE MYOSURE (ABLATOR) ×2 IMPLANT
SLEEVE SCD COMPRESS KNEE MED (STOCKING) ×2 IMPLANT
TOWEL OR 17X24 6PK STRL BLUE (TOWEL DISPOSABLE) IMPLANT

## 2022-10-28 NOTE — Brief Op Note (Addendum)
10/28/2022  10:25 AM  PATIENT:  Joan Becker  37 y.o. female  PRE-OPERATIVE DIAGNOSIS:  AUB  POST-OPERATIVE DIAGNOSIS:  AUB  PROCEDURE:  Procedure(s): DILATATION & CURETTAGE/HYSTEROSCOPY WITH MYOSURE (N/A)  SURGEON:  Surgeon(s) and Role:    Lorriane Shire, MD - Primary  PHYSICIAN ASSISTANT: n/a  ASSISTANTS: none   ANESTHESIA:   general  EBL:  10 mL   BLOOD ADMINISTERED:none  DRAINS: none   LOCAL MEDICATIONS USED:  BUPIVICAINE, PARACERVICAL BLOCK  SPECIMEN:  Source of Specimen:  endometrial curettings  DISPOSITION OF SPECIMEN:  PATHOLOGY  COUNTS:  YES  TOURNIQUET:  * No tourniquets in log *  DICTATION: .Note written in EPIC  PLAN OF CARE: Discharge to home after PACU  PATIENT DISPOSITION:  PACU - hemodynamically stable.   Delay start of Pharmacological VTE agent (>24hrs) due to surgical blood loss or risk of bleeding: not applicable

## 2022-10-28 NOTE — Anesthesia Postprocedure Evaluation (Signed)
Anesthesia Post Note  Patient: Joan Becker  Procedure(s) Performed: DILATATION & CURETTAGE/HYSTEROSCOPY WITH MYOSURE     Patient location during evaluation: PACU Anesthesia Type: General Level of consciousness: awake and alert Pain management: pain level controlled Vital Signs Assessment: post-procedure vital signs reviewed and stable Respiratory status: spontaneous breathing, nonlabored ventilation, respiratory function stable and patient connected to nasal cannula oxygen Cardiovascular status: blood pressure returned to baseline and stable Postop Assessment: no apparent nausea or vomiting Anesthetic complications: no   No notable events documented.  Last Vitals:  Vitals:   10/28/22 1115 10/28/22 1131  BP: (!) 150/92 (!) 141/92  Pulse: 76 67  Resp: 19 20  Temp:  36.8 C  SpO2: 93% 99%    Last Pain:  Vitals:   10/28/22 1032  TempSrc:   PainSc: 0-No pain                 Earl Lites P Edd Reppert

## 2022-10-28 NOTE — Op Note (Addendum)
Joan Becker PROCEDURE DATE: 10/28/2022  PREOPERATIVE DIAGNOSIS: AUB  POSTOPERATIVE DIAGNOSIS: AUB PROCEDURE:   hysteroscopic polypectomy, dilation and curettage SURGEON: Lorriane Shire, MD ASSISTANT:  None     INDICATIONS: 37 y.o. [id number] with AUB.  Risks of surgery were discussed with the patient including but not limited to: bleeding which may require transfusion; infection which may require antibiotics; injury to surrounding organs; need for additional procedures including laparotomy;  and other postoperative/anesthesia complications. Written informed consent was obtained.    FINDINGS:  Normal external genitalia, normal appearing cervix without lesions Hysteroscopy: bilateral tubal ostia visualized, polypoid endometrium throughout the cavity with greater projection from posterior wall of polyps  ANESTHESIA: General, paracervial block INTRAVENOUS FLUIDS:  450 ml of LR ESTIMATED BLOOD LOSS:  10 URINE OUTPUT: n/a FLUID DEFICIT: 300cc normal saline SPECIMENS: endometrial curettings COMPLICATIONS:  None immediate.   Procedure: The patient was taken to the operating room where spinal analgesia was inserted. SCDs were in place.  Time out was performed. Patient was placed in dorsolithotomy in Graham stirrups. She was prepped and draped in the usual sterile fashion.. A speculum was placeed in the vagina. The cervix was visualized anteriorly and grasped with a single-tooth tenaculum. Paracervical block was performed with 0.5% bupivicaine with 20 cc injected. Sequential dilation was performed with Shawnie Pons dilators. The hysteroscope was inserted and the endometrial cavity and inspected. There were the above findings noted in the endometrial cavity with both ostia seen.  Myosure reach was used to resect the polypoid endometrium and polyps. The hysteroscope was removed. Sharp curettage was performed in all 4 quadrants. All instruments were removed from the vagina. All instrument, needle and lap  counts were correct x2. The patient was awakened and is recovering in stable condition.   Lorriane Shire, MD Minimally Invasive Gynecologic Surgery and Chronic Pelvic Pain Specialist Obstetrics and Gynecology, [facility] for [facility], [facility] 10/28/2022

## 2022-10-28 NOTE — Anesthesia Preprocedure Evaluation (Signed)
Anesthesia Evaluation  Patient identified by MRN, date of birth, ID band Patient awake    Reviewed: Allergy & Precautions, NPO status , Patient's Chart, lab work & pertinent test results  Airway Mallampati: II  TM Distance: >3 FB Neck ROM: Full    Dental no notable dental hx.    Pulmonary sleep apnea    Pulmonary exam normal        Cardiovascular negative cardio ROS  Rhythm:Regular Rate:Normal     Neuro/Psych negative neurological ROS  negative psych ROS   GI/Hepatic negative GI ROS, Neg liver ROS,,,  Endo/Other  diabetesHypothyroidism    Renal/GU   Female GU complaint AUB    Musculoskeletal negative musculoskeletal ROS (+)    Abdominal Normal abdominal exam  (+)   Peds  Hematology negative hematology ROS (+)   Anesthesia Other Findings   Reproductive/Obstetrics                             Anesthesia Physical Anesthesia Plan  ASA: 2  Anesthesia Plan: General   Post-op Pain Management: Celebrex PO (pre-op)* and Tylenol PO (pre-op)*   Induction: Intravenous  PONV Risk Score and Plan: 3 and Ondansetron, Dexamethasone, Midazolam and Treatment may vary due to age or medical condition  Airway Management Planned: Mask and LMA  Additional Equipment: None  Intra-op Plan:   Post-operative Plan: Extubation in OR  Informed Consent: I have reviewed the patients History and Physical, chart, labs and discussed the procedure including the risks, benefits and alternatives for the proposed anesthesia with the patient or authorized representative who has indicated his/her understanding and acceptance.     Dental advisory given  Plan Discussed with: CRNA  Anesthesia Plan Comments:        Anesthesia Quick Evaluation

## 2022-10-28 NOTE — Discharge Instructions (Addendum)
Post-surgical Instructions, Outpatient Surgery  You may expect to feel dizzy, weak, and drowsy for as long as 24 hours after receiving the medicine that made you sleep (anesthetic). For the first 24 hours after your surgery:   Do not drive a car, ride a bicycle, participate in physical activities, or take public transportation until you are done taking narcotic pain medicines or as directed by Dr. Briscoe Deutscher.  Do not drink alcohol or take tranquilizers.  Do not take medicine that has not been prescribed by your physicians.  Do not sign important papers or make important decisions while on narcotic pain medicines.  Have a responsible person with you.   CARE OF INCISION You may shower on the first day after your surgery.  Do not sit in a tub bath for one week. Avoid heavy lifting (more than 10 pounds/4.5 kilograms), pushing, or pulling for the next 24 hrs  Avoid activities that may risk injury to your incisions.   PAIN MANAGEMENT Ibuprofen 800mg .  (This is the same as 4-200mg  over the counter tablets of Motrin or ibuprofen.)  Take this every 6 hours or as needed for cramping.   Acetaminophen 1000mg  (This is the same as 2-500mg  over the counter extra strength tylenol). Take this every 6 hours for the first 3 days or as needed afterwards for pain  DO'S AND DON'T'S Do not take a tub bath for 1 week.  You may shower on the first day after your surgery Do not do any heavy lifting for one to two weeks.  This increases the chance of bleeding. Do move around as you feel able.  Stairs are fine.  You may begin to exercise again as you feel able.  Do not lift any weights for two weeks. Do not put anything in the vagina for two weeks--no tampons, intercourse, or douching.    REGULAR MEDIATIONS/VITAMINS: You may restart all of your regular medications as prescribed. You may restart all of your vitamins as you normally take them.    PLEASE CALL OR SEEK MEDICAL CARE IF: You have persistent nausea and  vomiting.  You have trouble eating or drinking.  You have an oral temperature above 100.5.  You have constipation that is not helped by adjusting diet or increasing fluid intake. Pain medicines are a common cause of constipation.  You have heavy vaginal bleeding You have redness or drainage from your incision(s) or there is increasing pain or tenderness near or in the surgical site.    Post Anesthesia Home Care Instructions  Activity: Get plenty of rest for the remainder of the day. A responsible individual must stay with you for 24 hours following the procedure.  For the next 24 hours, DO NOT: -Drive a car -Advertising copywriter -Drink alcoholic beverages -Take any medication unless instructed by your physician -Make any legal decisions or sign important papers.  Meals: Start with liquid foods such as gelatin or soup. Progress to regular foods as tolerated. Avoid greasy, spicy, heavy foods. If nausea and/or vomiting occur, drink only clear liquids until the nausea and/or vomiting subsides. Call your physician if vomiting continues.  Special Instructions/Symptoms: Your throat may feel dry or sore from the anesthesia or the breathing tube placed in your throat during surgery. If this causes discomfort, gargle with warm salt water. The discomfort should disappear within 24 hours.  If you had a scopolamine patch placed behind your ear for the management of post- operative nausea and/or vomiting:  1. The medication in the patch is effective  for 72 hours, after which it should be removed.  Wrap patch in a tissue and discard in the trash. Wash hands thoroughly with soap and water. 2. You may remove the patch earlier than 72 hours if you experience unpleasant side effects which may include dry mouth, dizziness or visual disturbances. 3. Avoid touching the patch. Wash your hands with soap and water after contact with the patch.    No ibuprofen, Advil, Aleve, Motrin, ketorolac, meloxicam, naproxen,  or other NSAIDS until after 2:05 pm today if needed. No acetaminophen/Tylenol until after 2:05 pm today if needed.

## 2022-10-28 NOTE — Anesthesia Procedure Notes (Signed)
Procedure Name: LMA Insertion Date/Time: 10/28/2022 9:57 AM  Performed by: Briant Sites, CRNAPre-anesthesia Checklist: Patient identified, Emergency Drugs available, Suction available and Patient being monitored Patient Re-evaluated:Patient Re-evaluated prior to induction Oxygen Delivery Method: Circle system utilized Preoxygenation: Pre-oxygenation with 100% oxygen Induction Type: IV induction Ventilation: Mask ventilation without difficulty LMA: LMA with gastric port inserted LMA Size: 4.0 Number of attempts: 1 Airway Equipment and Method: Bite block Placement Confirmation: positive ETCO2 Tube secured with: Tape Dental Injury: Teeth and Oropharynx as per pre-operative assessment

## 2022-10-28 NOTE — Transfer of Care (Signed)
Immediate Anesthesia Transfer of Care Note  Patient: Joan Becker  Procedure(s) Performed: DILATATION & CURETTAGE/HYSTEROSCOPY WITH MYOSURE  Patient Location: PACU  Anesthesia Type:General  Level of Consciousness: sedated  Airway & Oxygen Therapy: Patient Spontanous Breathing and Patient connected to nasal cannula oxygen  Post-op Assessment: Report given to RN  Post vital signs: Reviewed and stable  Last Vitals:  Vitals Value Taken Time  BP 134/90 10/28/22 1034  Temp    Pulse 81 10/28/22 1035  Resp 20 10/28/22 1035  SpO2 96 % 10/28/22 1035  Vitals shown include unvalidated device data.  Last Pain:  Vitals:   10/28/22 0812  TempSrc: Oral  PainSc: 0-No pain      Patients Stated Pain Goal: 4 (10/28/22 1610)  Complications: No notable events documented.

## 2022-10-28 NOTE — H&P (Signed)
OB/GYN Pre-Op History and Physical  Joan Becker is a 37 y.o. [id number] presenting for AUB and dysmenorrhea.       Past Medical History:  Diagnosis Date   Abdominal pain in pregnancy, antepartum 06/06/2014   Abnormal uterine bleeding (AUB)    History of kidney stones    Hypothyroidism    Kidney stones    Obesity    PCOS (polycystic ovarian syndrome)    Postpartum care following vaginal delivery (4/9) 08/19/2014   Sleep apnea 10/19/2022   mild, does not have cpap yet   Thyroid disease     Past Surgical History:  Procedure Laterality Date   ADENOIDECTOMY     ANKLE ARTHROSCOPY Right 03/21/2018   Procedure: RIGHT ANKLE ARTHROSCOPY, OSTEOCHONDRAL DEBRIDEMENT, REMOVAL OF LOOSE BODY;  Surgeon: Eldred Manges, MD;  Location: [facility];  Service: Orthopedics;  Laterality: Right;   CESAREAN SECTION N/A 01/07/2018   Procedure: CESAREAN SECTION;  Surgeon: Adam Phenix, MD;  Location: [facility];  Service: Obstetrics;  Laterality: N/A;   DILATION AND CURETTAGE OF UTERUS  2016   HYSTEROSCOPY WITH D & C N/A 08/11/2012   Procedure: DILATATION AND CURETTAGE /HYSTEROSCOPY;  Surgeon: Adam Phenix, MD;  Location: [facility];  Service: Gynecology;  Laterality: N/A;   MYRINGOTOMY     ORIF TIBIA PLATEAU Right 10/26/2017   Procedure: OPEN REDUCTION INTERNAL FIXATION (ORIF) TIBIAL PLATEAU, MEDIAL AND LATERAL CONDYLE FIXATION, RIGHT ANKLE SPLINT APPLICATION;  Surgeon: Eldred Manges, MD;  Location: [facility];  Service: Orthopedics;  Laterality: Right;   right ankle surgery  2021   TONSILLECTOMY     as child   TYMPANOSTOMY TUBE PLACEMENT     WISDOM TOOTH EXTRACTION      OB History  Gravida Para Term Preterm AB Living  2 2 2  0 0 2  SAB IAB Ectopic Multiple Live Births  0 0 0 0 2    # Outcome Date GA Lbr Len/2nd Weight Sex Delivery Anes PTL Lv  2 Term 01/07/18 [redacted]w[redacted]d  3380 g F CS-LTranv Spinal  LIV     Birth Comments: No gross anomalies  1 Term 08/18/14 [redacted]w[redacted]d 04:01 / 01:39  3535 g F Vag-Spont EPI  LIV    Social History   Socioeconomic History   Marital status: Married    Spouse name: Not on file   Number of children: Not on file   Years of education: Not on file   Highest education level: Not on file  Occupational History   Not on file  Tobacco Use   Smoking status: Never   Smokeless tobacco: Never  Vaping Use   Vaping Use: Never used  Substance and Sexual Activity   Alcohol use: Not Currently   Drug use: Never   Sexual activity: Yes    Birth control/protection: None  Other Topics Concern   Not on file  Social History Narrative   ** Merged History Encounter **       Social Determinants of Health   Financial Resource Strain: Low Risk  (12/24/2017)   Overall Financial Resource Strain (CARDIA)    Difficulty of Paying Living Expenses: Not hard at all  Food Insecurity: No Food Insecurity (12/24/2017)   Hunger Vital Sign    Worried About Running Out of Food in the Last Year: Never true    Ran Out of Food in the Last Year: Never true  Transportation Needs: No Transportation Needs (12/24/2017)   PRAPARE - Transportation    Lack of  Transportation (Medical): No    Lack of Transportation (Non-Medical): No  Physical Activity: Not on file  Stress: No Stress Concern Present (12/24/2017)   [employer] of Occupational Health - Occupational Stress Questionnaire    Feeling of Stress : Only a little  Social Connections: Not on file    Family History  Problem Relation Age of Onset   Diabetes Father    Hypertension Father    Cancer Neg Hx     Medications Prior to Admission  Medication Sig Dispense Refill Last Dose   amphetamine-dextroamphetamine (ADDERALL) 30 MG tablet TK 1 T PO BID  0 10/27/2022   levothyroxine (SYNTHROID) 200 MCG tablet Take 1 tablet (200 mcg total) by mouth daily before breakfast. 90 tablet 0 10/28/2022   megestrol (MEGACE) 40 MG tablet Take 1 tablet (40 mg total) by mouth 2 (two) times daily. Can increase to two tablets twice  a day in the event of heavy bleeding 60 tablet 5 10/27/2022   VITAMIN D, CHOLECALCIFEROL, PO Take by mouth. Patient is taking 16109 units daily.   10/27/2022    No Known Allergies  Review of Systems: Negative except for what is mentioned in HPI.     Physical Exam: BP 130/88   Pulse 67   Temp 98.3 F (36.8 C) (Oral)   Resp 18   Ht 5' 7.5" (1.715 m)   Wt (!) 137 kg   LMP 10/21/2022   SpO2 98%   BMI 46.60 kg/m  CONSTITUTIONAL: Well-developed, well-nourished female in no acute distress.  HENT:  Normocephalic, atraumatic, External right and left ear normal. Oropharynx is clear and moist EYES: Conjunctivae and EOM are normal. Pupils are equal, round, and reactive to light. No scleral icterus.  NECK: Normal range of motion, supple, no masses SKIN: Skin is warm and dry. No rash noted. Not diaphoretic. No erythema. No pallor. NEUROLGIC: Alert and oriented to person, place, and time. Normal reflexes, muscle tone coordination. No cranial nerve deficit noted. PSYCHIATRIC: Normal mood and affect. Normal behavior. Normal judgment and thought content. RESPIRATORY: Normal effort PELVIC: Deferred   Pertinent Labs/Studies:   Results for orders placed or performed during the hospital encounter of 10/28/22 (from the past 72 hour(s))  Pregnancy, urine POC     Status: None   Collection Time: 10/28/22  8:00 AM  Result Value Ref Range   Preg Test, Ur NEGATIVE NEGATIVE    Comment:        THE SENSITIVITY OF THIS METHODOLOGY IS >24 mIU/mL        Assessment and Plan :Joan Becker is a 37 y.o. [id number] here for AUB scheduled for diagnostic hysteroscopy, dilation and curettage.  Consent signed and on chart.     Joan Becker, M.D. Minimally Invasive Gynecologic Surgery and Pelvic Pain Specialist Attending Obstetrician & Gynecologist, Faculty Practice Center for [employer], [facility]

## 2022-10-29 ENCOUNTER — Encounter (HOSPITAL_BASED_OUTPATIENT_CLINIC_OR_DEPARTMENT_OTHER): Payer: Self-pay | Admitting: Obstetrics and Gynecology

## 2022-10-29 LAB — SURGICAL PATHOLOGY

## 2022-11-20 ENCOUNTER — Encounter: Payer: Self-pay | Admitting: Obstetrics and Gynecology

## 2022-11-20 ENCOUNTER — Ambulatory Visit (INDEPENDENT_AMBULATORY_CARE_PROVIDER_SITE_OTHER): Payer: Medicaid Other | Admitting: Obstetrics and Gynecology

## 2022-11-20 VITALS — BP 120/65 | HR 71 | Ht 67.0 in | Wt 302.0 lb

## 2022-11-20 DIAGNOSIS — N939 Abnormal uterine and vaginal bleeding, unspecified: Secondary | ICD-10-CM

## 2022-11-20 DIAGNOSIS — Z09 Encounter for follow-up examination after completed treatment for conditions other than malignant neoplasm: Secondary | ICD-10-CM

## 2022-11-20 NOTE — Progress Notes (Signed)
Patient presents for post op. Patient complaining of having light bleeding at night. Armandina Stammer RN

## 2022-11-20 NOTE — Progress Notes (Signed)
   POSTOPERATIVE VISIT NOTE   Subjective:     Joan Becker is a 37 y.o. [id number] who presents to the clinic 3 weeks status post operative hysteroscopy for abnormal uterine bleeding. Eating a regular diet without difficulty. Bowel movements are normal. Pain is controlled without any medications. Incision: n/a Vaginal bleeding: intermittent bleeding primarily at night - will go through the day without much of an issue and then may pass a blood clot or have light bleeding overnight. Does not occur nightly. Minimal pain. Interested in an ablation - previously tried IUD and had bleeding throughout the time it was in, does not want to take pills if it can be avoided and bariatric recommended against megace since it can cause weight gain    The following portions of the patient's history were reviewed and updated as appropriate: allergies, current medications, past family history, past medical history, past social history, past surgical history, and problem list..   Review of Systems Pertinent items are noted in HPI.    Objective:    BP 120/65   Pulse 71   Ht 5\' 7"  (1.702 m)   Wt (!) 302 lb (137 kg)   LMP 10/21/2022   BMI 47.30 kg/m  General:  alert, cooperative, and no distress  Incision:   N/A  Pelvic:   Exam deferred.    Pathology Results: FINAL MICROSCOPIC DIAGNOSIS:   A. ENDOMETRIUM, POLYP, CURETTAGE:  -  Focal fragments of weakly proliferative endometrium with thick-walled  vessels present in the background of abundant extensively  decidualized/pseudodecidualized stroma with associated attenuated  endometrial glands consistent with steroid effect indicative of at least  a component of this extensive decidualized stroma corresponding to a  polyp.  - Negative for atypia/hyperplasia  -  Rare fragment of unremarkable myometrium    Assessment:   Doing well postoperatively. Operative findings again reviewed. Pathology report discussed.   1. Postop check Doing well   2.  Abnormal uterine bleeding (AUB) Discussed options of observation vs medication management of current bleeding and she would like to proceed with observation for now. If bleeding becomes more bothersome or frequent would consider ablation at that time.     Plan:   1. Continue any current medications. 2. Contact clinic if bleeding worsens 3. Activity restrictions: none 4. Follow up as needed   Lorriane Shire, MD Obstetrician & Gynecologist, [facility] for [employer], [facility]

## 2022-11-25 ENCOUNTER — Encounter: Payer: Self-pay | Admitting: Obstetrics and Gynecology

## 2022-11-25 ENCOUNTER — Other Ambulatory Visit: Payer: Self-pay | Admitting: Obstetrics and Gynecology

## 2022-11-25 DIAGNOSIS — N939 Abnormal uterine and vaginal bleeding, unspecified: Secondary | ICD-10-CM

## 2022-11-25 MED ORDER — NORETHINDRONE ACETATE 5 MG PO TABS
5.0000 mg | ORAL_TABLET | Freq: Every day | ORAL | 2 refills | Status: DC
Start: 2022-11-25 — End: 2023-08-03

## 2022-12-03 ENCOUNTER — Telehealth: Payer: Self-pay

## 2022-12-03 NOTE — Telephone Encounter (Signed)
Contacted patient to see if she was available for surgery on 12/21/22 w/ Dr. Briscoe Deutscher? Patient would like a date in September. Will call to provide dates.

## 2022-12-04 NOTE — Telephone Encounter (Signed)
Error

## 2022-12-21 ENCOUNTER — Telehealth: Payer: Self-pay

## 2022-12-21 NOTE — Telephone Encounter (Signed)
Reached out to patient to advise Dr. Briscoe Deutscher has openings in September. Left a voicemail asking for a return call for scheduling @[phone].

## 2022-12-22 ENCOUNTER — Telehealth: Payer: Self-pay

## 2022-12-22 NOTE — Telephone Encounter (Signed)
Reached out to patient to schedule surgery with Dr. Briscoe Deutscher. Patient asked to be scheduled on 01/18/23 at [facility]. Advised I would schedule her and call her back with the exact time to arrive and provide surgery details.

## 2023-01-06 ENCOUNTER — Other Ambulatory Visit: Payer: Self-pay

## 2023-01-06 ENCOUNTER — Encounter (HOSPITAL_BASED_OUTPATIENT_CLINIC_OR_DEPARTMENT_OTHER): Payer: Self-pay | Admitting: Obstetrics and Gynecology

## 2023-01-06 NOTE — Progress Notes (Signed)
Spoke w/ via phone for pre-op interview---pt Lab needs dos---- urine preg              Lab results------none COVID test -----patient states asymptomatic no test needed Arrive at -------645 am 01-18-2023 NPO after MN NO Solid Food.  Clear liquids from MN until---545 am Med rec completed Medications to take morning of surgery -----norethidrone, levothyroxinne Diabetic medication -----n/a Patient instructed no nail polish to be worn day of surgery Patient instructed to bring photo id and insurance card day of surgery Patient aware to have Driver (ride ) / caregiver    for 24 hours after surgery  husband Joan Becker Patient Special Instructions -----none Pre-Op special Instructions -----none Patient verbalized understanding of instructions that were given at this phone interview. Patient denies shortness of breath, chest pain, fever, cough at this phone interview.

## 2023-01-16 NOTE — Anesthesia Preprocedure Evaluation (Signed)
Anesthesia Evaluation  Patient identified by MRN, date of birth, ID band Patient awake    Reviewed: Allergy & Precautions, NPO status , Patient's Chart, lab work & pertinent test results  Airway Mallampati: II  TM Distance: >3 FB Neck ROM: Full    Dental no notable dental hx. (+) Teeth Intact, Dental Advisory Given   Pulmonary sleep apnea and Continuous Positive Airway Pressure Ventilation    Pulmonary exam normal breath sounds clear to auscultation       Cardiovascular negative cardio ROS Normal cardiovascular exam Rhythm:Regular Rate:Normal     Neuro/Psych negative neurological ROS  negative psych ROS   GI/Hepatic negative GI ROS, Neg liver ROS,,,  Endo/Other  Hypothyroidism  Morbid obesity (BMI 46.1)  Renal/GU   Female GU complaint AUB    Musculoskeletal negative musculoskeletal ROS (+)    Abdominal Normal abdominal exam  (+)   Peds  Hematology negative hematology ROS (+)   Anesthesia Other Findings   Reproductive/Obstetrics                             Anesthesia Physical Anesthesia Plan  ASA: 3  Anesthesia Plan: General   Post-op Pain Management: Celebrex PO (pre-op)* and Tylenol PO (pre-op)*   Induction: Intravenous  PONV Risk Score and Plan: 3 and Ondansetron, Dexamethasone, Midazolam and Treatment may vary due to age or medical condition  Airway Management Planned: LMA  Additional Equipment: None  Intra-op Plan:   Post-operative Plan: Extubation in OR  Informed Consent: I have reviewed the patients History and Physical, chart, labs and discussed the procedure including the risks, benefits and alternatives for the proposed anesthesia with the patient or authorized representative who has indicated his/her understanding and acceptance.     Dental advisory given  Plan Discussed with: CRNA  Anesthesia Plan Comments:        Anesthesia Quick Evaluation

## 2023-01-18 ENCOUNTER — Other Ambulatory Visit: Payer: Self-pay

## 2023-01-18 ENCOUNTER — Encounter (HOSPITAL_BASED_OUTPATIENT_CLINIC_OR_DEPARTMENT_OTHER)
Admission: RE | Disposition: A | Discharge status: Home / Self Care | Payer: Self-pay | Source: Home / Self Care | Attending: Obstetrics and Gynecology

## 2023-01-18 ENCOUNTER — Ambulatory Visit (HOSPITAL_BASED_OUTPATIENT_CLINIC_OR_DEPARTMENT_OTHER): Payer: Medicaid Other | Admitting: Certified Registered"

## 2023-01-18 ENCOUNTER — Encounter (HOSPITAL_BASED_OUTPATIENT_CLINIC_OR_DEPARTMENT_OTHER): Payer: Self-pay | Admitting: Obstetrics and Gynecology

## 2023-01-18 ENCOUNTER — Ambulatory Visit (HOSPITAL_BASED_OUTPATIENT_CLINIC_OR_DEPARTMENT_OTHER)
Admission: RE | Admit: 2023-01-18 | Discharge: 2023-01-18 | Disposition: A | Discharge status: Home / Self Care | Payer: Medicaid Other | Attending: Obstetrics and Gynecology | Admitting: Obstetrics and Gynecology

## 2023-01-18 DIAGNOSIS — N939 Abnormal uterine and vaginal bleeding, unspecified: Secondary | ICD-10-CM | POA: Diagnosis present

## 2023-01-18 DIAGNOSIS — E039 Hypothyroidism, unspecified: Secondary | ICD-10-CM | POA: Diagnosis not present

## 2023-01-18 DIAGNOSIS — Z01818 Encounter for other preprocedural examination: Secondary | ICD-10-CM

## 2023-01-18 DIAGNOSIS — N938 Other specified abnormal uterine and vaginal bleeding: Secondary | ICD-10-CM | POA: Diagnosis not present

## 2023-01-18 HISTORY — PX: DILITATION & CURRETTAGE/HYSTROSCOPY WITH NOVASURE ABLATION: SHX5568

## 2023-01-18 LAB — POCT PREGNANCY, URINE: Preg Test, Ur: NEGATIVE

## 2023-01-18 LAB — MRSA NEXT GEN BY PCR, NASAL: MRSA by PCR Next Gen: DETECTED — AB

## 2023-01-18 SURGERY — DILATATION & CURETTAGE/HYSTEROSCOPY WITH NOVASURE ABLATION
Anesthesia: General

## 2023-01-18 MED ORDER — ACETAMINOPHEN 500 MG PO TABS
1000.0000 mg | ORAL_TABLET | ORAL | Status: AC
  Administered 2023-01-18: 1000 mg via ORAL

## 2023-01-18 MED ORDER — HYDROMORPHONE HCL 1 MG/ML IJ SOLN
INTRAMUSCULAR | Status: AC
  Filled 2023-01-18: qty 1

## 2023-01-18 MED ORDER — LIDOCAINE HCL (PF) 2 % IJ SOLN
INTRAMUSCULAR | Status: AC
  Filled 2023-01-18: qty 5

## 2023-01-18 MED ORDER — PROPOFOL 10 MG/ML IV BOLUS
INTRAVENOUS | Status: AC
  Filled 2023-01-18: qty 20

## 2023-01-18 MED ORDER — OXYCODONE HCL 5 MG PO TABS
5.0000 mg | ORAL_TABLET | Freq: Once | ORAL | Status: AC | PRN
  Administered 2023-01-18: 5 mg via ORAL

## 2023-01-18 MED ORDER — LACTATED RINGERS IV SOLN: INTRAVENOUS | Status: DC

## 2023-01-18 MED ORDER — KETOROLAC TROMETHAMINE 30 MG/ML IJ SOLN: 30.0000 mg | Freq: Once | INTRAMUSCULAR | Status: DC | PRN

## 2023-01-18 MED ORDER — DEXMEDETOMIDINE HCL IN NACL 200 MCG/50ML IV SOLN
INTRAVENOUS | Status: DC | PRN
Start: 2023-01-18 — End: 2023-01-18
  Administered 2023-01-18: 8 ug via INTRAVENOUS

## 2023-01-18 MED ORDER — HYDROMORPHONE HCL 1 MG/ML IJ SOLN
0.2500 mg | INTRAMUSCULAR | Status: DC | PRN
  Administered 2023-01-18 (×2): 0.25 mg via INTRAVENOUS

## 2023-01-18 MED ORDER — BUPIVACAINE HCL (PF) 0.5 % IJ SOLN
INTRAMUSCULAR | Status: DC | PRN
  Administered 2023-01-18: 20 mL

## 2023-01-18 MED ORDER — MUPIROCIN 2 % EX OINT
TOPICAL_OINTMENT | Freq: Two times a day (BID) | CUTANEOUS | Status: DC
  Filled 2023-01-18: qty 22

## 2023-01-18 MED ORDER — MIDAZOLAM HCL 2 MG/2ML IJ SOLN
INTRAMUSCULAR | Status: AC
  Filled 2023-01-18: qty 2

## 2023-01-18 MED ORDER — OXYCODONE HCL 5 MG PO TABS
ORAL_TABLET | ORAL | Status: AC
  Filled 2023-01-18: qty 1

## 2023-01-18 MED ORDER — MUPIROCIN 2 % EX OINT: 1.0000 | TOPICAL_OINTMENT | Freq: Two times a day (BID) | CUTANEOUS | 0 refills | Status: DC

## 2023-01-18 MED ORDER — DEXAMETHASONE SODIUM PHOSPHATE 10 MG/ML IJ SOLN
INTRAMUSCULAR | Status: AC
  Filled 2023-01-18: qty 1

## 2023-01-18 MED ORDER — ACETAMINOPHEN 500 MG PO TABS: 1000.0000 mg | ORAL_TABLET | Freq: Once | ORAL | Status: DC

## 2023-01-18 MED ORDER — MICONAZOLE NITRATE 2 % EX CREA: 1.0000 | TOPICAL_CREAM | Freq: Two times a day (BID) | CUTANEOUS | 0 refills | Status: DC

## 2023-01-18 MED ORDER — PROPOFOL 500 MG/50ML IV EMUL
INTRAVENOUS | Status: DC | PRN
Start: 2023-01-18 — End: 2023-01-18
  Administered 2023-01-18: 35 ug/kg/min via INTRAVENOUS

## 2023-01-18 MED ORDER — KETOROLAC TROMETHAMINE 30 MG/ML IJ SOLN
INTRAMUSCULAR | Status: AC
  Filled 2023-01-18: qty 1

## 2023-01-18 MED ORDER — FENTANYL CITRATE (PF) 100 MCG/2ML IJ SOLN
INTRAMUSCULAR | Status: AC
  Filled 2023-01-18: qty 2

## 2023-01-18 MED ORDER — ACETAMINOPHEN 500 MG PO TABS
ORAL_TABLET | ORAL | Status: AC
  Filled 2023-01-18: qty 2

## 2023-01-18 MED ORDER — ONDANSETRON HCL 4 MG/2ML IJ SOLN
INTRAMUSCULAR | Status: DC | PRN
  Administered 2023-01-18: 4 mg via INTRAVENOUS

## 2023-01-18 MED ORDER — PROPOFOL 10 MG/ML IV BOLUS
INTRAVENOUS | Status: DC | PRN
  Administered 2023-01-18: 200 mg via INTRAVENOUS

## 2023-01-18 MED ORDER — DEXAMETHASONE SODIUM PHOSPHATE 10 MG/ML IJ SOLN
INTRAMUSCULAR | Status: DC | PRN
  Administered 2023-01-18: 5 mg via INTRAVENOUS

## 2023-01-18 MED ORDER — FENTANYL CITRATE (PF) 100 MCG/2ML IJ SOLN
INTRAMUSCULAR | Status: DC | PRN
  Administered 2023-01-18: 50 ug via INTRAVENOUS
  Administered 2023-01-18: 25 ug via INTRAVENOUS

## 2023-01-18 MED ORDER — LIDOCAINE HCL (CARDIAC) PF 100 MG/5ML IV SOSY
PREFILLED_SYRINGE | INTRAVENOUS | Status: DC | PRN
  Administered 2023-01-18: 100 mg via INTRAVENOUS

## 2023-01-18 MED ORDER — OXYCODONE HCL 5 MG PO TABS
5.0000 mg | ORAL_TABLET | ORAL | 0 refills | Status: DC | PRN
Start: 2023-01-18 — End: 2023-08-03

## 2023-01-18 MED ORDER — KETOROLAC TROMETHAMINE 30 MG/ML IJ SOLN
INTRAMUSCULAR | Status: DC | PRN
Start: 2023-01-18 — End: 2023-01-18
  Administered 2023-01-18: 30 mg via INTRAVENOUS

## 2023-01-18 MED ORDER — ONDANSETRON HCL 4 MG/2ML IJ SOLN
INTRAMUSCULAR | Status: AC
  Filled 2023-01-18: qty 2

## 2023-01-18 MED ORDER — ONDANSETRON HCL 4 MG/2ML IJ SOLN: 4.0000 mg | Freq: Once | INTRAMUSCULAR | Status: DC | PRN

## 2023-01-18 MED ORDER — MIDAZOLAM HCL 2 MG/2ML IJ SOLN
INTRAMUSCULAR | Status: DC | PRN
  Administered 2023-01-18: 1 mg via INTRAVENOUS

## 2023-01-18 MED ORDER — SODIUM CHLORIDE 0.9 % IR SOLN
Status: DC | PRN
  Administered 2023-01-18: 3000 mL

## 2023-01-18 MED ORDER — LACTATED RINGERS IV SOLN: INTRAVENOUS | Status: DC | PRN

## 2023-01-18 MED ORDER — OXYCODONE HCL 5 MG/5ML PO SOLN: 5.0000 mg | Freq: Once | ORAL | Status: AC | PRN

## 2023-01-18 SURGICAL SUPPLY — 18 items
ABLATOR SURESOUND NOVASURE (ABLATOR) IMPLANT
DEVICE MYOSURE LITE (MISCELLANEOUS) IMPLANT
DRSG VASELINE 3X18 (GAUZE/BANDAGES/DRESSINGS) IMPLANT
ENDOLOOP SUT PDS II 0 18 (SUTURE) IMPLANT
GLOVE BIO SURGEON STRL SZ7 (GLOVE) ×2 IMPLANT
GLOVE BIOGEL PI IND STRL 6 (GLOVE) IMPLANT
GLOVE BIOGEL PI IND STRL 7.0 (GLOVE) IMPLANT
GOWN STRL REUS W/TWL LRG LVL3 (GOWN DISPOSABLE) IMPLANT
GOWN STRL REUS W/TWL XL LVL3 (GOWN DISPOSABLE) ×2 IMPLANT
KIT PROCEDURE FLUENT (KITS) ×2 IMPLANT
KIT TURNOVER CYSTO (KITS) ×2 IMPLANT
PACK VAGINAL MINOR WOMEN LF (CUSTOM PROCEDURE TRAY) ×2 IMPLANT
PAD OB MATERNITY 4.3X12.25 (PERSONAL CARE ITEMS) ×2 IMPLANT
SEAL CERVICAL OMNI LOK (ABLATOR) IMPLANT
SEAL ROD LENS SCOPE MYOSURE (ABLATOR) ×2 IMPLANT
SLEEVE SCD COMPRESS KNEE MED (STOCKING) ×2 IMPLANT
SOL PREP POV-IOD 4OZ 10% (MISCELLANEOUS) IMPLANT
SUT VICRYL 0 ENDOLOOP (SUTURE) IMPLANT

## 2023-01-18 NOTE — Discharge Instructions (Addendum)
Post-surgical Instructions, Outpatient Surgery  You may expect to feel dizzy, weak, and drowsy for as long as 24 hours after receiving the medicine that made you sleep (anesthetic). For the first 24 hours after your surgery:   Do not drive a car, ride a bicycle, participate in physical activities, or take public transportation until you are done taking narcotic pain medicines or as directed by Dr. Briscoe Deutscher.  Do not drink alcohol or take tranquilizers.  Do not take medicine that has not been prescribed by your physicians.  Do not sign important papers or make important decisions while on narcotic pain medicines.  Have a responsible person with you.    PAIN MANAGEMENT Ibuprofen 800mg .  (This is the same as 4-200mg  over the counter tablets of Motrin or ibuprofen.)  Take this every 6 hours or as needed for cramping.   Acetaminophen 1000mg  (This is the same as 2-500mg  over the counter extra strength tylenol). Take this every 6 hours for the first 3 days or as needed afterwards for pain  DO'S AND DON'T'S Do not take a tub bath for 4 weeks.  You may shower on the first day after your surgery Do move around as you feel able.  Stairs are fine.  You may begin to exercise again as you feel able.  Do not lift any weights for two weeks. Do not put anything in the vagina for two weeks--no tampons, intercourse, or douching.    REGULAR MEDIATIONS/VITAMINS: You may restart all of your regular medications as prescribed. You may restart all of your vitamins as you normally take them.    PLEASE CALL OR SEEK MEDICAL CARE IF: You have persistent nausea and vomiting.  You have trouble eating or drinking.  You have an oral temperature above 100.5.  You have constipation that is not helped by adjusting diet or increasing fluid intake. Pain medicines are a common cause of constipation.  You have heavy vaginal bleeding You have redness or drainage from your incision(s) or there is increasing pain or tenderness near  or in the surgical site.          No acetaminophen/Tylenol until after 1:05 pm today if needed.  No ibuprofen, Advil, Aleve, Motrin, ketorolac, meloxicam, naproxen, or other NSAIDS until after 3:20 pm today if needed.     Post Anesthesia Home Care Instructions  Activity: Get plenty of rest for the remainder of the day. A responsible individual must stay with you for 24 hours following the procedure.  For the next 24 hours, DO NOT: -Drive a car -Advertising copywriter -Drink alcoholic beverages -Take any medication unless instructed by your physician -Make any legal decisions or sign important papers.  Meals: Start with liquid foods such as gelatin or soup. Progress to regular foods as tolerated. Avoid greasy, spicy, heavy foods. If nausea and/or vomiting occur, drink only clear liquids until the nausea and/or vomiting subsides. Call your physician if vomiting continues.  Special Instructions/Symptoms: Your throat may feel dry or sore from the anesthesia or the breathing tube placed in your throat during surgery. If this causes discomfort, gargle with warm salt water. The discomfort should disappear within 24 hours.

## 2023-01-18 NOTE — H&P (Signed)
OB/GYN Pre-Op History and Physical  Joan Becker is a 37 y.o. [id number] presenting for surgical management of AUB.       Past Medical History:  Diagnosis Date   Abdominal pain in pregnancy, antepartum 06/06/2014   Abnormal uterine bleeding (AUB)    History of kidney stones    Hypothyroidism    Kidney stones    Obesity    PCOS (polycystic ovarian syndrome)    Postpartum care following vaginal delivery (4/9) 08/19/2014   Sleep apnea 10/19/2022   mild, does not have cpap yet   Thyroid disease     Past Surgical History:  Procedure Laterality Date   ADENOIDECTOMY     yrs ago   ANKLE ARTHROSCOPY Right 03/21/2018   Procedure: RIGHT ANKLE ARTHROSCOPY, OSTEOCHONDRAL DEBRIDEMENT, REMOVAL OF LOOSE BODY;  Surgeon: Eldred Manges, MD;  Location: [facility];  Service: Orthopedics;  Laterality: Right;   CESAREAN SECTION N/A 01/07/2018   Procedure: CESAREAN SECTION;  Surgeon: Adam Phenix, MD;  Location: [facility];  Service: Obstetrics;  Laterality: N/A;   DILATATION & CURETTAGE/HYSTEROSCOPY WITH MYOSURE N/A 10/28/2022   Procedure: DILATATION & CURETTAGE/HYSTEROSCOPY WITH MYOSURE;  Surgeon: Lorriane Shire, MD;  Location: [facility];  Service: Gynecology;  Laterality: N/A;   DILATION AND CURETTAGE OF UTERUS  2016   HYSTEROSCOPY WITH D & C N/A 08/11/2012   Procedure: DILATATION AND CURETTAGE /HYSTEROSCOPY;  Surgeon: Adam Phenix, MD;  Location: [facility];  Service: Gynecology;  Laterality: N/A;   MYRINGOTOMY     as child   ORIF TIBIA PLATEAU Right 10/26/2017   Procedure: OPEN REDUCTION INTERNAL FIXATION (ORIF) TIBIAL PLATEAU, MEDIAL AND LATERAL CONDYLE FIXATION, RIGHT ANKLE SPLINT APPLICATION;  Surgeon: Eldred Manges, MD;  Location: [facility];  Service: Orthopedics;  Laterality: Right;   right ankle surgery  2021   TONSILLECTOMY     as child   TYMPANOSTOMY TUBE PLACEMENT     WISDOM TOOTH EXTRACTION      OB History  Gravida Para Term Preterm AB  Living  2 2 2  0 0 2  SAB IAB Ectopic Multiple Live Births  0 0 0 0 2    # Outcome Date GA Lbr Len/2nd Weight Sex Type Anes PTL Lv  2 Term 01/07/18 [redacted]w[redacted]d  3380 g F CS-LTranv Spinal  LIV     Birth Comments: No gross anomalies  1 Term 08/18/14 [redacted]w[redacted]d 04:01 / 01:39 3535 g F Vag-Spont EPI  LIV    Social History   Socioeconomic History   Marital status: Married    Spouse name: Not on file   Number of children: Not on file   Years of education: Not on file   Highest education level: Not on file  Occupational History   Not on file  Tobacco Use   Smoking status: Never   Smokeless tobacco: Never  Vaping Use   Vaping status: Never Used  Substance and Sexual Activity   Alcohol use: Not Currently   Drug use: Never   Sexual activity: Yes    Birth control/protection: None  Other Topics Concern   Not on file  Social History Narrative   ** Merged History Encounter **       Social Determinants of Health   Financial Resource Strain: Low Risk  (06/09/2022)   Received from [employer], [facility]   Overall Financial Resource Strain (CARDIA)    Difficulty of Paying Living Expenses: Not hard at all  Food Insecurity: No Food Insecurity (  06/09/2022)   Received from [facility], [facility]   Hunger Vital Sign    Worried About Running Out of Food in the Last Year: Never true    Ran Out of Food in the Last Year: Never true  Transportation Needs: No Transportation Needs (06/09/2022)   Received from [facility], [facility]   [facility] - Transportation    Lack of Transportation (Medical): No    Lack of Transportation (Non-Medical): No  Physical Activity: Unknown (06/09/2022)   Received from [facility], [facility]   Exercise Vital Sign    Days of Exercise per Week: 0 days    Minutes of Exercise per Session: Not on file  Stress: No Stress Concern Present (06/09/2022)   Received from [facility], [facility] - Occupational Stress  Questionnaire    Feeling of Stress : Not at all  Social Connections: Socially Integrated (06/09/2022)   Received from [facility], [facility]   Social Network    How would you rate your social network (family, work, friends)?: Good participation with social networks    Family History  Problem Relation Age of Onset   Diabetes Father    Hypertension Father    Cancer Neg Hx     Medications Prior to Admission  Medication Sig Dispense Refill Last Dose   amphetamine-dextroamphetamine (ADDERALL) 30 MG tablet 30 mg 2 (two) times daily.  0 01/17/2023   levothyroxine (SYNTHROID) 200 MCG tablet Take 1 tablet (200 mcg total) by mouth daily before breakfast. 90 tablet 0 01/18/2023 at 0530   norethindrone (AYGESTIN) 5 MG tablet Take 1 tablet (5 mg total) by mouth daily. 30 tablet 2 01/17/2023   VITAMIN D, CHOLECALCIFEROL, PO Take by mouth. Patient is taking 16109 units daily. (Patient not taking: Reported on 01/06/2023)   More than a month    No Known Allergies  Review of Systems: Negative except for what is mentioned in HPI.     Physical Exam: BP (!) 137/90   Pulse 77   Temp 97.6 F (36.4 C) (Oral)   Resp 16   Ht 5\' 7"  (1.702 m)   Wt (!) 137.8 kg   SpO2 96%   BMI 47.60 kg/m  CONSTITUTIONAL: Well-developed, well-nourished female in no acute distress.  HENT:  Normocephalic, atraumatic, External right and left ear normal. Oropharynx is clear and moist EYES: Conjunctivae and EOM are normal. Pupils are equal, round, and reactive to light. No scleral icterus.  NECK: Normal range of motion, supple, no masses SKIN: Skin is warm and dry. No rash noted. Not diaphoretic. No erythema. No pallor. NEUROLGIC: Alert and oriented to person, place, and time. Normal reflexes, muscle tone coordination. No cranial nerve deficit noted. PSYCHIATRIC: Normal mood and affect. Normal behavior. Normal judgment and thought content. RESPIRATORY: normal effort PELVIC: Deferred   Pertinent Labs/Studies:    Results for orders placed or performed during the hospital encounter of 01/18/23 (from the past 72 hour(s))  Pregnancy, urine POC     Status: None   Collection Time: 01/18/23  6:48 AM  Result Value Ref Range   Preg Test, Ur NEGATIVE NEGATIVE    Comment:        THE SENSITIVITY OF THIS METHODOLOGY IS >24 mIU/mL        Assessment and Plan :Joan Becker is a 37 y.o. [id number] here for endometrial ablation for AUB.    Lorriane Shire, M.D. Minimally Invasive Gynecologic Surgery and Pelvic Pain Specialist Attending Obstetrician & Gynecologist,  Biochemist, clinical for [employer], [facility]

## 2023-01-18 NOTE — Transfer of Care (Signed)
Immediate Anesthesia Transfer of Care Note  Patient: Joan Becker  Procedure(s) Performed: DILATATION & CURETTAGE/HYSTEROSCOPY WITH NOVASURE ABLATION  Patient Location: PACU  Anesthesia Type:General  Level of Consciousness: awake, drowsy, and patient cooperative  Airway & Oxygen Therapy: Patient Spontanous Breathing and Patient connected to face mask oxygen  Post-op Assessment: Report given to RN and Post -op Vital signs reviewed and stable  Post vital signs: Reviewed and stable  Last Vitals:  Vitals Value Taken Time  BP 148/97 01/18/23 0934  Temp    Pulse 74 01/18/23 0936  Resp 18 01/18/23 0936  SpO2 97 % 01/18/23 0936  Vitals shown include unfiled device data.  Last Pain:  Vitals:   01/18/23 0701  TempSrc: Oral  PainSc: 0-No pain      Patients Stated Pain Goal: 5 (01/18/23 0701)  Complications: No notable events documented.

## 2023-01-18 NOTE — Brief Op Note (Signed)
01/18/2023  9:22 AM  PATIENT:  Terence Lux Schauer  37 y.o. female  PRE-OPERATIVE DIAGNOSIS:  Abnormal uterine bleeding  POST-OPERATIVE DIAGNOSIS:  Abnormal uterine bleeding  PROCEDURE:  Procedure(s): DILATATION & CURETTAGE/HYSTEROSCOPY WITH NOVASURE ABLATION (N/A)  SURGEON:  Surgeons and Role:    Lorriane Shire, MD - Primary  PHYSICIAN ASSISTANT: n/a  ASSISTANTS: none   ANESTHESIA:   general and paracervical block  EBL:  15 ml  BLOOD ADMINISTERED:none  DRAINS: none   LOCAL MEDICATIONS USED:  BUPIVICAINE   SPECIMEN:  Source of Specimen:  endometrial curettings  DISPOSITION OF SPECIMEN:  PATHOLOGY  COUNTS:  YES  TOURNIQUET:  * No tourniquets in log *  DICTATION: .Note written in EPIC  PLAN OF CARE: Discharge to home after PACU  PATIENT DISPOSITION:  PACU - hemodynamically stable.   Delay start of Pharmacological VTE agent (>24hrs) due to surgical blood loss or risk of bleeding: not applicable

## 2023-01-18 NOTE — Anesthesia Postprocedure Evaluation (Signed)
Anesthesia Post Note  Patient: Joan Becker  Procedure(s) Performed: DILATATION & CURETTAGE/HYSTEROSCOPY WITH NOVASURE ABLATION     Patient location during evaluation: PACU Anesthesia Type: General Level of consciousness: awake and alert Pain management: pain level controlled Vital Signs Assessment: post-procedure vital signs reviewed and stable Respiratory status: spontaneous breathing, nonlabored ventilation, respiratory function stable and patient connected to nasal cannula oxygen Cardiovascular status: blood pressure returned to baseline and stable Postop Assessment: no apparent nausea or vomiting Anesthetic complications: no   No notable events documented.  Last Vitals:  Vitals:   01/18/23 1045 01/18/23 1115  BP: (!) 147/98 (!) 143/99  Pulse: 82 66  Resp: (!) 21 20  Temp:  (!) 36.2 C  SpO2: 94% 96%    Last Pain:  Vitals:   01/18/23 1130  TempSrc:   PainSc: 3                  Trevor Iha

## 2023-01-18 NOTE — Op Note (Signed)
Joan Becker PROCEDURE DATE: 01/18/2023  PREOPERATIVE DIAGNOSIS: abnormal uterine bleeding  POSTOPERATIVE DIAGNOSIS: abnormal uterine bleeding PROCEDURE:  hysteroscopy, dilation and curettage, Novasure endometrial ablation  SURGEON: Lorriane Shire, MD ASSISTANT:  none  INDICATIONS: 37 y.o. [id number] with AUB.  Risks of surgery were discussed with the patient including but not limited to: bleeding which may require transfusion; infection which may require antibiotics; injury to surrounding organs; need for additional procedures including laparotomy;  and other postoperative/anesthesia complications. Written informed consent was obtained.    FINDINGS:  Normal external genitalia with erythema and moisture within groin folds and underneath pannus/superior to mons Hysteroscope: proliferative/polypoid endometrium, bilateral tubal ostia visualized  ANESTHESIA: General, paracervical block INTRAVENOUS FLUIDS:  500 ml of LR ESTIMATED BLOOD LOSS:  15 cc URINE OUTPUT: n/a SPECIMENS:  COMPLICATIONS:  None immediate. FLUID DEFICIT: 255 cc normal saline    Procedure: Patient was taken to the OR where she was placed in dorsal lithotomy in Allen stirrups. SCDs were in place. An adequate timeout was obtained. The patient was prepped and draped in the usual sterile fashion. A speculum was placed inside the vagina and the cervix visualized. The cervix was grasped anteriorly with a single-tooth tenaculum. 20 cc of 0.5% percent Marcaine were injected for paracervical block. There was proliferative/polypoid endometrium noted and was resected with the myosure lite. The cavity was sounded to nearly 7cm. The novasure device was introduced into the cavity and the width measured to 4.5cm. The initial cavity assessment failed. A second tenaculum was placed without successful cavity assessment. A vaseline gauze was wrapped around the plunger and the cavity assessment initially passed but then the ablation was not able  to be completed. The novasure was removed and an endoloop was placed around the cervix at the level of the internal os. Once the endoloop was secured, a new Novasure was introduced into the cavity and the loop tightened, and the cavity assessment passed. The cavity was ablated for 1:11 at 161 W. The device was removed and the hysteroscope reintroduced into the cavity and appeared adequately ablated. The endoloop was cut and removed and all instruments were removed from the vagina. The sample sent to pathology. All instrument, needle, and lap counts were correct x2. The patient was awakened taken to recovery room in stable condition.  Lorriane Shire MD 01/18/2023 9:23 AM

## 2023-01-19 ENCOUNTER — Encounter (HOSPITAL_BASED_OUTPATIENT_CLINIC_OR_DEPARTMENT_OTHER): Payer: Self-pay | Admitting: Obstetrics and Gynecology

## 2023-01-19 LAB — SURGICAL PATHOLOGY

## 2023-01-20 ENCOUNTER — Telehealth: Payer: Self-pay

## 2023-01-20 NOTE — Telephone Encounter (Signed)
-----   Message from Lorriane Shire sent at 01/20/2023  8:52 AM EDT ----- Notify of normal tissue removed prior to ablation, we will see how she is doing at postop

## 2023-01-20 NOTE — Telephone Encounter (Signed)
Called patient to inform her of normal results. Left message for patient to call the office back. chiquita l wilson, CMA

## 2023-02-19 ENCOUNTER — Ambulatory Visit: Payer: Medicaid Other | Admitting: Obstetrics and Gynecology

## 2023-02-19 ENCOUNTER — Encounter: Payer: Self-pay | Admitting: Obstetrics and Gynecology

## 2023-02-19 VITALS — BP 119/81 | HR 76 | Ht 67.0 in | Wt 299.0 lb

## 2023-02-19 DIAGNOSIS — Z803 Family history of malignant neoplasm of breast: Secondary | ICD-10-CM

## 2023-02-19 DIAGNOSIS — Z09 Encounter for follow-up examination after completed treatment for conditions other than malignant neoplasm: Secondary | ICD-10-CM

## 2023-02-19 NOTE — Progress Notes (Signed)
   POSTOPERATIVE VISIT NOTE   Subjective:     Joan Becker is a 37 y.o. [id number] who presents to the clinic 4 weeks status post operative hysteroscopy and endometrial ablation  for abnormal uterine bleeding. Eating a regular diet without difficulty. Bowel movements are normal. The patient is not having any pain. Incision: n/a Vaginal bleeding: spotting but otherwise no bleeding Resumed sexual acitivity:  2 weeks had excruciating pain that she had to stay in the bed for, but since then has been fine   Dad side aunts and gradmother had breast cancer   The following portions of the patient's history were reviewed and updated as appropriate: allergies, current medications, past family history, past medical history, past social history, past surgical history, and problem list..   Review of Systems Pertinent items are noted in HPI.    Objective:    BP 119/81   Pulse 76   Ht 5\' 7"  (1.702 m)   Wt 299 lb (135.6 kg)   LMP  (LMP Unknown)   Breastfeeding No   BMI 46.83 kg/m  General:  alert, cooperative, and no distress  Abdomen: soft, bowel sounds active, non-tender  Incision:   N/A  Pelvic:   Exam deferred.     Assessment:   Doing well postoperatively. Operative findings again reviewed. Pathology report discussed.   1. Postop check Doing well   2. Family history of breast cancer Requesting mammogram  - MM 3D SCREENING MAMMOGRAM BILATERAL BREAST; Future    Plan:   1. Continue any current medications. 2. Will continue to monitor menses going forward 3. Activity restrictions: none 4. Anticipated return to work: not applicable. 5. Follow up as needed   Lorriane Shire, MD Obstetrician & Gynecologist, [facility] for [employer], [facility]

## 2023-03-02 ENCOUNTER — Inpatient Hospital Stay (HOSPITAL_BASED_OUTPATIENT_CLINIC_OR_DEPARTMENT_OTHER): Admission: RE | Admit: 2023-03-02 | Payer: Medicaid Other | Source: Ambulatory Visit

## 2023-08-03 ENCOUNTER — Ambulatory Visit (INDEPENDENT_AMBULATORY_CARE_PROVIDER_SITE_OTHER): Admitting: Podiatry

## 2023-08-03 ENCOUNTER — Ambulatory Visit (INDEPENDENT_AMBULATORY_CARE_PROVIDER_SITE_OTHER)

## 2023-08-03 ENCOUNTER — Encounter: Payer: Self-pay | Admitting: Podiatry

## 2023-08-03 DIAGNOSIS — M7751 Other enthesopathy of right foot: Secondary | ICD-10-CM | POA: Diagnosis not present

## 2023-08-03 DIAGNOSIS — M7750 Other enthesopathy of unspecified foot: Secondary | ICD-10-CM | POA: Diagnosis not present

## 2023-08-03 MED ORDER — METHYLPREDNISOLONE 4 MG PO TBPK: ORAL_TABLET | ORAL | 0 refills | Status: DC

## 2023-08-03 MED ORDER — TRIAMCINOLONE ACETONIDE 40 MG/ML IJ SUSP
20.0000 mg | Freq: Once | INTRAMUSCULAR | Status: AC
Start: 2023-08-03 — End: 2023-08-03
  Administered 2023-08-03: 20 mg

## 2023-08-03 MED ORDER — MELOXICAM 15 MG PO TABS: 15.0000 mg | ORAL_TABLET | Freq: Every day | ORAL | 3 refills | Status: AC

## 2023-08-03 NOTE — Progress Notes (Signed)
 Subjective:  Patient ID: Joan Becker, female    DOB: 1986/03/03,  MRN: [id number] HPI Chief Complaint  Patient presents with   Ankle Pain    Lateral/anterior ankle right - severe aching x 4 months, no injury, previous surgeries, pain is constant, worse at night, tried advil, making it very hard to walk now, unable to walk barefoot by end of day, feels extremely tight, tried soaking   New Patient (Initial Visit)    38 y.o. female presents with the above complaint.   ROS: Denies fever chills nausea vomit muscle aches pains calf pain back pain chest pain shortness of breath.  Past Medical History:  Diagnosis Date   Abdominal pain in pregnancy, antepartum 06/06/2014   Abnormal uterine bleeding (AUB)    History of kidney stones    Hypothyroidism    Kidney stones    Obesity    PCOS (polycystic ovarian syndrome)    Postpartum care following vaginal delivery (4/9) 08/19/2014   Sleep apnea 10/19/2022   mild, does not have cpap yet   Thyroid disease    Past Surgical History:  Procedure Laterality Date   ADENOIDECTOMY     yrs ago   ANKLE ARTHROSCOPY Right 03/21/2018   Procedure: RIGHT ANKLE ARTHROSCOPY, OSTEOCHONDRAL DEBRIDEMENT, REMOVAL OF LOOSE BODY;  Surgeon: Eldred Manges, MD;  Location: [facility];  Service: Orthopedics;  Laterality: Right;   CESAREAN SECTION N/A 01/07/2018   Procedure: CESAREAN SECTION;  Surgeon: Adam Phenix, MD;  Location: [facility];  Service: Obstetrics;  Laterality: N/A;   DILATATION & CURETTAGE/HYSTEROSCOPY WITH MYOSURE N/A 10/28/2022   Procedure: DILATATION & CURETTAGE/HYSTEROSCOPY WITH MYOSURE;  Surgeon: Lorriane Shire, MD;  Location: [facility];  Service: Gynecology;  Laterality: N/A;   DILATION AND CURETTAGE OF UTERUS  2016   DILITATION & CURRETTAGE/HYSTROSCOPY WITH NOVASURE ABLATION N/A 01/18/2023   Procedure: DILATATION & CURETTAGE/HYSTEROSCOPY WITH NOVASURE ABLATION;  Surgeon: Lorriane Shire, MD;   Location: [facility];  Service: Gynecology;  Laterality: N/A;   HYSTEROSCOPY WITH D & C N/A 08/11/2012   Procedure: DILATATION AND CURETTAGE /HYSTEROSCOPY;  Surgeon: Adam Phenix, MD;  Location: [facility];  Service: Gynecology;  Laterality: N/A;   MYRINGOTOMY     as child   ORIF TIBIA PLATEAU Right 10/26/2017   Procedure: OPEN REDUCTION INTERNAL FIXATION (ORIF) TIBIAL PLATEAU, MEDIAL AND LATERAL CONDYLE FIXATION, RIGHT ANKLE SPLINT APPLICATION;  Surgeon: Eldred Manges, MD;  Location: [facility];  Service: Orthopedics;  Laterality: Right;   right ankle surgery  2021   TONSILLECTOMY     as child   TYMPANOSTOMY TUBE PLACEMENT     WISDOM TOOTH EXTRACTION      Current Outpatient Medications:    amphetamine-dextroamphetamine (ADDERALL) 30 MG tablet, 30 mg 2 (two) times daily., Disp: , Rfl: 0   levothyroxine (SYNTHROID) 200 MCG tablet, Take 1 tablet (200 mcg total) by mouth daily before breakfast., Disp: 90 tablet, Rfl: 0   meloxicam (MOBIC) 15 MG tablet, Take 1 tablet (15 mg total) by mouth daily., Disp: 30 tablet, Rfl: 3   methylPREDNISolone (MEDROL DOSEPAK) 4 MG TBPK tablet, 6 day dose pack - take as directed, Disp: 21 tablet, Rfl: 0  No Known Allergies Review of Systems Objective:  There were no vitals filed for this visit.  General: Well developed, nourished, in no acute distress, alert and oriented x3   Dermatological: Skin is warm, dry and supple bilateral. Nails x 10 are well maintained; remaining integument appears unremarkable at  this time. There are no open sores, no preulcerative lesions, she does however have a rash to the lateral aspect of the right heel appears to be excoriations but no cellulitis at this point.   Vascular: Dorsalis Pedis artery and Posterior Tibial artery pedal pulses are 2/4 bilateral with immedate capillary fill time. Pedal hair growth present. No varicosities and no lower extremity edema present bilateral.   Neruologic: Grossly intact via light  touch bilateral. Vibratory intact via tuning fork bilateral. Protective threshold with Semmes Wienstein monofilament intact to all pedal sites bilateral. Patellar and Achilles deep tendon reflexes 2+ bilateral. No Babinski or clonus noted bilateral.   Musculoskeletal: No gross boney pedal deformities bilateral. No pain, crepitus, or limitation noted with foot and ankle range of motion bilateral. Muscular strength 5/5 in all groups tested bilateral.  Swollen edematous foot right the majority of her pain is located overlying the sinus tarsi right she has pain on inversion eversion of the subtalar joint.  She has tenderness on sharp dorsiflexion of the ankle with no clonus.  Gait: Unassisted, Nonantalgic.    Radiographs:  Radiographs demonstrate osseously mature individual good bone mineralization with a syndesmotic brace in the ankle with a plate over the fibula.  Subtalar joint appears to be arthritic particularly posterior process area.  No acute findings.  Assessment & Plan:   Assessment: Excoriation or eczematous dermatitis right foot.  Subtalar joint osteoarthritis.  Plan: Injected subtalar joint today with Kenalog local anesthetic start her on methylprednisolone to be followed by meloxicam.  Placed her in a cam boot which she has at home and I will follow-up with her in 4 to 6 weeks     Joan Becker T. Strawn, [place]

## 2023-08-17 ENCOUNTER — Emergency Department (HOSPITAL_BASED_OUTPATIENT_CLINIC_OR_DEPARTMENT_OTHER)
Admission: EM | Admit: 2023-08-17 | Discharge: 2023-08-17 | Disposition: A | Discharge status: Home / Self Care | Attending: Emergency Medicine | Admitting: Emergency Medicine

## 2023-08-17 ENCOUNTER — Other Ambulatory Visit: Payer: Self-pay

## 2023-08-17 ENCOUNTER — Emergency Department (HOSPITAL_BASED_OUTPATIENT_CLINIC_OR_DEPARTMENT_OTHER)

## 2023-08-17 ENCOUNTER — Other Ambulatory Visit: Payer: Self-pay | Admitting: Urology

## 2023-08-17 ENCOUNTER — Encounter (HOSPITAL_BASED_OUTPATIENT_CLINIC_OR_DEPARTMENT_OTHER): Payer: Self-pay | Admitting: Emergency Medicine

## 2023-08-17 DIAGNOSIS — R109 Unspecified abdominal pain: Secondary | ICD-10-CM | POA: Diagnosis present

## 2023-08-17 DIAGNOSIS — N200 Calculus of kidney: Secondary | ICD-10-CM

## 2023-08-17 DIAGNOSIS — E039 Hypothyroidism, unspecified: Secondary | ICD-10-CM | POA: Insufficient documentation

## 2023-08-17 DIAGNOSIS — Z79899 Other long term (current) drug therapy: Secondary | ICD-10-CM | POA: Insufficient documentation

## 2023-08-17 DIAGNOSIS — N132 Hydronephrosis with renal and ureteral calculous obstruction: Secondary | ICD-10-CM | POA: Insufficient documentation

## 2023-08-17 LAB — CBC WITH DIFFERENTIAL/PLATELET
Abs Immature Granulocytes: 0.02 10*3/uL (ref 0.00–0.07)
Basophils Absolute: 0 10*3/uL (ref 0.0–0.1)
Basophils Relative: 1 %
Eosinophils Absolute: 0.1 10*3/uL (ref 0.0–0.5)
Eosinophils Relative: 1 %
HCT: 41.8 % (ref 36.0–46.0)
Hemoglobin: 13.6 g/dL (ref 12.0–15.0)
Immature Granulocytes: 0 %
Lymphocytes Relative: 23 %
Lymphs Abs: 1.4 10*3/uL (ref 0.7–4.0)
MCH: 27.7 pg (ref 26.0–34.0)
MCHC: 32.5 g/dL (ref 30.0–36.0)
MCV: 85.1 fL (ref 80.0–100.0)
Monocytes Absolute: 0.4 10*3/uL (ref 0.1–1.0)
Monocytes Relative: 7 %
Neutro Abs: 4.2 10*3/uL (ref 1.7–7.7)
Neutrophils Relative %: 68 %
Platelets: 247 10*3/uL (ref 150–400)
RBC: 4.91 MIL/uL (ref 3.87–5.11)
RDW: 14.9 % (ref 11.5–15.5)
WBC: 6.2 10*3/uL (ref 4.0–10.5)
nRBC: 0 % (ref 0.0–0.2)

## 2023-08-17 LAB — COMPREHENSIVE METABOLIC PANEL WITH GFR
ALT: 23 U/L (ref 0–44)
AST: 18 U/L (ref 15–41)
Albumin: 4.2 g/dL (ref 3.5–5.0)
Alkaline Phosphatase: 44 U/L (ref 38–126)
Anion gap: 9 (ref 5–15)
BUN: 15 mg/dL (ref 6–20)
CO2: 22 mmol/L (ref 22–32)
Calcium: 9.3 mg/dL (ref 8.9–10.3)
Chloride: 104 mmol/L (ref 98–111)
Creatinine, Ser: 0.95 mg/dL (ref 0.44–1.00)
GFR, Estimated: >60 mL/min (ref >60)
Glucose, Bld: 127 mg/dL — ABNORMAL HIGH (ref 70–99)
Potassium: 3.8 mmol/L (ref 3.5–5.1)
Sodium: 135 mmol/L (ref 135–145)
Total Bilirubin: 0.9 mg/dL (ref 0.0–1.2)
Total Protein: 7.7 g/dL (ref 6.5–8.1)

## 2023-08-17 LAB — URINALYSIS, MICROSCOPIC (REFLEX): RBC / HPF: >50 RBC/hpf (ref 0–5)

## 2023-08-17 LAB — URINALYSIS, ROUTINE W REFLEX MICROSCOPIC
Bilirubin Urine: NEGATIVE
Glucose, UA: NEGATIVE mg/dL
Ketones, ur: 15 mg/dL — AB
Nitrite: NEGATIVE
Protein, ur: NEGATIVE mg/dL
Specific Gravity, Urine: 1.025 (ref 1.005–1.030)
pH: 6 (ref 5.0–8.0)

## 2023-08-17 LAB — HCG, SERUM, QUALITATIVE: Preg, Serum: NEGATIVE

## 2023-08-17 LAB — LIPASE, BLOOD: Lipase: 25 U/L (ref 11–51)

## 2023-08-17 MED ORDER — FENTANYL CITRATE PF 50 MCG/ML IJ SOSY
50.0000 ug | PREFILLED_SYRINGE | Freq: Once | INTRAMUSCULAR | Status: AC
  Administered 2023-08-17: 50 ug via INTRAVENOUS
  Filled 2023-08-17: qty 1

## 2023-08-17 MED ORDER — KETOROLAC TROMETHAMINE 15 MG/ML IJ SOLN
15.0000 mg | Freq: Once | INTRAMUSCULAR | Status: AC
  Administered 2023-08-17: 15 mg via INTRAVENOUS
  Filled 2023-08-17: qty 1

## 2023-08-17 MED ORDER — OXYCODONE-ACETAMINOPHEN 5-325 MG PO TABS
1.0000 | ORAL_TABLET | Freq: Once | ORAL | Status: AC
  Administered 2023-08-17: 1 via ORAL
  Filled 2023-08-17: qty 1

## 2023-08-17 MED ORDER — ACETAMINOPHEN 500 MG PO TABS
500.0000 mg | ORAL_TABLET | Freq: Once | ORAL | Status: AC
Start: 2023-08-17 — End: 2023-08-17
  Administered 2023-08-17: 500 mg via ORAL
  Filled 2023-08-17: qty 1

## 2023-08-17 MED ORDER — IBUPROFEN 800 MG PO TABS: 800.0000 mg | ORAL_TABLET | Freq: Three times a day (TID) | ORAL | 0 refills | Status: AC

## 2023-08-17 MED ORDER — LACTATED RINGERS IV BOLUS: 1000.0000 mL | Freq: Once | INTRAVENOUS | Status: DC

## 2023-08-17 MED ORDER — LACTATED RINGERS IV BOLUS
1000.0000 mL | Freq: Once | INTRAVENOUS | Status: AC
  Administered 2023-08-17: 1000 mL via INTRAVENOUS

## 2023-08-17 MED ORDER — TAMSULOSIN HCL 0.4 MG PO CAPS: 0.4000 mg | ORAL_CAPSULE | Freq: Every day | ORAL | 0 refills | Status: DC

## 2023-08-17 MED ORDER — PROCHLORPERAZINE EDISYLATE 10 MG/2ML IJ SOLN
10.0000 mg | Freq: Once | INTRAMUSCULAR | Status: AC
  Administered 2023-08-17: 10 mg via INTRAVENOUS
  Filled 2023-08-17: qty 2

## 2023-08-17 MED ORDER — OXYCODONE HCL 5 MG PO TABS: 5.0000 mg | ORAL_TABLET | ORAL | 0 refills | Status: DC | PRN

## 2023-08-17 MED ORDER — TAMSULOSIN HCL 0.4 MG PO CAPS
0.4000 mg | ORAL_CAPSULE | Freq: Once | ORAL | Status: AC
  Administered 2023-08-17: 0.4 mg via ORAL
  Filled 2023-08-17: qty 1

## 2023-08-17 MED ORDER — ONDANSETRON 4 MG PO TBDP: 4.0000 mg | ORAL_TABLET | Freq: Three times a day (TID) | ORAL | 0 refills | Status: AC | PRN

## 2023-08-17 MED ORDER — ONDANSETRON HCL 4 MG/2ML IJ SOLN
4.0000 mg | Freq: Once | INTRAMUSCULAR | Status: AC
  Administered 2023-08-17: 4 mg via INTRAVENOUS
  Filled 2023-08-17: qty 2

## 2023-08-17 NOTE — ED Triage Notes (Signed)
 Pt c/o LT flank pain that started this morning; c/o nausea at times

## 2023-08-17 NOTE — Discharge Instructions (Addendum)
 You are found to have kidney stone on your left side.  Because of the size, it is unlikely to pass on its own.  I have talked to our urologist Dr. Margo Aye, and he will have his office contact you to set up an appointment to do a procedure to help break up the stone.  Please be on the look out for a call.  Please strain your urine in case you do pass the stone.  If you pass the stone, bring the stone to the urology appointment with you.  You are being sent home with several medications to help with the pain and nausea:  -Flomax-this is a medication to help pass the stone, it allows urine to exit the body more freely.  Please take this once daily with a meal.  -Ibuprofen 800 mg-this is a medication that will help with pain as well as passing the stone.  Please take this every 8 hours.  Take this with food as it can cause stomach upset. Do not take other NSAIDs such as Motrin, Aleve, Advil, Mobic, or Naproxen with this medicine as they are similar and would propagate any potential side effects such as stomach upset, stomach ulcers or GI bleeding. Do not take your meloxicam while taking ibuprofen  -Oxycodone- This is a narcotic/controlled substance medication that has potential addicting qualities.  You may take 1 tablet every 4-6 hours as needed for severe pain no controlled with ibuprofen.  Do not drive or operate heavy machinery when taking this medicine as it can be sedating. Do not drink alcohol or take other sedating medications when taking this medicine for safety reasons.  Keep this out of reach of small children.    -Zofran-this is an antinausea medication, you may take this every 8 hours as needed for nausea and vomiting. Please allow the tablet to dissolve under your tongue, you do not have to swallow this medication  Return to the ER for new or worsening symptoms including but not limited to worsening pain not controlled by these medicines, inability to keep fluids down, fever, or any other  concerns that you may have.

## 2023-08-17 NOTE — ED Provider Notes (Signed)
 [facility] Provider Note   CSN: [id number] Arrival date & time: 08/17/23  1157     History  Chief Complaint  Patient presents with   Flank Pain    Joan Becker is a 38 y.o. female with history of hypothyroidism, who presents with concern for left-sided flank pain and left-sided abdominal pain that started this morning.  She also reports associated nausea and vomiting.  Denies any fever or chills.  Denies any dysuria, hematuria, increased frequency.   Reports remote history of a kidney stone that passed on its own without need for intervention. Previous abdominal surgeries include hysterectomy.    Flank Pain       Home Medications Prior to Admission medications   Medication Sig Start Date End Date Taking? Authorizing Provider  amphetamine-dextroamphetamine (ADDERALL) 30 MG tablet 30 mg 2 (two) times daily. 02/15/18   [provider]  ibuprofen (ADVIL) 800 MG tablet Take 1 tablet (800 mg total) by mouth 3 (three) times daily. 08/17/23  Yes Rexie Catena, PA-C  levothyroxine (SYNTHROID) 200 MCG tablet Take 1 tablet (200 mcg total) by mouth daily before breakfast. 04/10/20   Stinson, Jacob J, DO  meloxicam (MOBIC) 15 MG tablet Take 1 tablet (15 mg total) by mouth daily. 08/03/23   Hyatt, Max T, DPM  methylPREDNISolone (MEDROL DOSEPAK) 4 MG TBPK tablet 6 day dose pack - take as directed 08/03/23   Hyatt, Max T, DPM  ondansetron (ZOFRAN-ODT) 4 MG disintegrating tablet Take 1 tablet (4 mg total) by mouth every 8 (eight) hours as needed for nausea or vomiting. 08/17/23  Yes Rexie Catena, PA-C  oxyCODONE (ROXICODONE) 5 MG immediate release tablet Take 1 tablet (5 mg total) by mouth every 4 (four) hours as needed for up to 5 days for severe pain (pain score 7-10) or moderate pain (pain score 4-6). 08/17/23 08/22/23 Yes Rexie Catena, PA-C  tamsulosin (FLOMAX) 0.4 MG CAPS capsule Take 1 capsule (0.4 mg total) by mouth daily after  breakfast. Stop taking after you pass your kidney stone 08/18/23  Yes Rexie Catena, PA-C      Allergies    Patient has no known allergies.    Review of Systems   Review of Systems  Genitourinary:  Positive for flank pain.    Physical Exam Updated Vital Signs BP 131/85   Pulse (!) 52   Temp 97.6 F (36.4 C)   Resp 18   Ht 5\' 7"  (1.702 m)   Wt 131.5 kg   LMP 08/04/2023   SpO2 95%   BMI 45.42 kg/m  Physical Exam Vitals and nursing note reviewed.  Constitutional:      Appearance: Normal appearance.     Comments: Uncomfortable appearing, laying on side and holding her back. Tearful. No active vomiting  HENT:     Head: Atraumatic.  Cardiovascular:     Rate and Rhythm: Normal rate and regular rhythm.  Pulmonary:     Effort: Pulmonary effort is normal.  Neurological:     General: No focal deficit present.     Mental Status: She is alert.  Psychiatric:        Mood and Affect: Mood normal.        Behavior: Behavior normal.     ED Results / Procedures / Treatments   Labs (all labs ordered are listed, but only abnormal results are displayed) Labs Reviewed  COMPREHENSIVE METABOLIC PANEL WITH GFR - Abnormal; Notable for the following components:  Result Value   Glucose, Bld 127 (*)    All other components within normal limits  URINALYSIS, ROUTINE W REFLEX MICROSCOPIC - Abnormal; Notable for the following components:   APPearance CLOUDY (*)    Hgb urine dipstick LARGE (*)    Ketones, ur 15 (*)    Leukocytes,Ua TRACE (*)    All other components within normal limits  URINALYSIS, MICROSCOPIC (REFLEX) - Abnormal; Notable for the following components:   Bacteria, UA RARE (*)    All other components within normal limits  CBC WITH DIFFERENTIAL/PLATELET  LIPASE, BLOOD  HCG, SERUM, QUALITATIVE    EKG None  Radiology CT Renal Stone Study Result Date: 08/17/2023 CLINICAL DATA:  Left flank pain.  Concern for kidney stone. EXAM: CT ABDOMEN AND PELVIS WITHOUT CONTRAST  TECHNIQUE: Multidetector CT imaging of the abdomen and pelvis was performed following the standard protocol without IV contrast. RADIATION DOSE REDUCTION: This exam was performed according to the departmental dose-optimization program which includes automated exposure control, adjustment of the mA and/or kV according to patient size and/or use of iterative reconstruction technique. COMPARISON:  CT abdomen pelvis dated 07/06/2012. FINDINGS: Evaluation of this exam is limited in the absence of intravenous contrast. Lower chest: The visualized lung bases are clear. No intra-abdominal free air or free fluid. Hepatobiliary: Mild fatty liver. No biliary ductal dilatation. The gallbladder is unremarkable. Pancreas: Unremarkable. No pancreatic ductal dilatation or surrounding inflammatory changes. Spleen: Normal in size without focal abnormality. Adrenals/Urinary Tract: The adrenal glands unremarkable. There is an 8 mm stone at the left ureteropelvic junction with mild left hydronephrosis. The right kidney, right ureter appear unremarkable. The urinary bladder is collapsed. Stomach/Bowel: There is no bowel obstruction or active inflammation. The appendix is normal. Vascular/Lymphatic: Mild aortoiliac atherosclerotic disease. The IVC is unremarkable. No portal venous gas. There is no aneurysm. Reproductive: The uterus is anteverted. No suspicious adnexal masses. Other: None Musculoskeletal: No acute or significant osseous findings. IMPRESSION: 1. An 8 mm left UPJ stone with mild left hydronephrosis. 2. No bowel obstruction. Normal appendix. 3. Mild fatty liver. Electronically Signed   By: Angus Bark M.D.   On: 08/17/2023 13:42    Procedures Procedures    Medications Ordered in ED Medications  fentaNYL (SUBLIMAZE) injection 50 mcg (has no administration in time range)  oxyCODONE-acetaminophen (PERCOCET/ROXICET) 5-325 MG per tablet 1 tablet (has no administration in time range)  acetaminophen (TYLENOL) tablet  500 mg (has no administration in time range)  prochlorperazine (COMPAZINE) injection 10 mg (has no administration in time range)  lactated ringers bolus 1,000 mL (0 mLs Intravenous Stopped 08/17/23 1558)  ketorolac (TORADOL) 15 MG/ML injection 15 mg (15 mg Intravenous Given 08/17/23 1443)  ondansetron (ZOFRAN) injection 4 mg (4 mg Intravenous Given 08/17/23 1444)  fentaNYL (SUBLIMAZE) injection 50 mcg (50 mcg Intravenous Given 08/17/23 1446)  tamsulosin (FLOMAX) capsule 0.4 mg (0.4 mg Oral Given 08/17/23 1449)    ED Course/ Medical Decision Making/ A&P                                 Medical Decision Making Amount and/or Complexity of Data Reviewed Labs: ordered. Radiology: ordered.  Risk Prescription drug management.     Differential diagnosis includes but is not limited to Cholelithiasis, cholangitis, choledocholithiasis, peptic ulcer, gastritis, gastroenteritis, appendicitis, IBS, IBD, DKA, nephrolithiasis, UTI, pyelonephritis, pancreatitis, diverticulitis, mesenteric ischemia, abdominal aortic aneurysm, small bowel obstruction, volvulus   ED Course:  Upon initial evaluation, patient  is uncomfortable appearing and holding her back. She is tearful. Vital signs stable. Will get her toradol and fentanyl for pain, zofran for nausea. Will start LR bolus as she is unable to provide urine sample due to being dehydrated.   Labs Ordered: I Ordered, and personally interpreted labs.  The pertinent results include:   CBC with leukocytosis CMP without elevation creatinine or LFTs.  Normal electrolytes Lipase within normal limits Pregnancy negative Urinalysis with red blood cells present, but patient states she is on her menstrual cycle.  No nitrites, trace leukocytes  Imaging Studies ordered: I ordered imaging studies including CT renal I independently visualized the imaging with scope of interpretation limited to determining acute life threatening conditions related to emergency care. Imaging  showed 8 mm left UPJ stone with mild left hydronephrosis I agree with the radiologist interpretation   Consultations Obtained: I requested consultation with the urologist Dr. Del Favia,  and discussed lab and imaging findings as well as pertinent plan - they recommend: Discharging with Flomax and pain medicines, they will plan on lithotripsy outpatient. Dr. Del Favia will have his office call the patient to schedule this appointment.  Medications Given: Toradol and fentyl for pain Zofran for nausea Flomax for nephrolithiasis LR for dehydration  Upon re-evaluation, patient reports pain has been well-controlled with medications, but states pain is returning. Will give additional fentanyl, oxycodone, tylenol and compazine prior to discharge. Pain appears to be consistent with 8mm kidney stone found on CT. No lab abnormalities or other findings on CT to explain her pain.  No signs of infection on urinalysis, no leukocytosis.  Will have her follow-up with urology outpatient for lithotripsy per Dr. Del Favia. Pain controlled and she is able to tolerate PO. No indication for hospitalization at this time.  Stable and appropriate for discharge home    Impression: Left-sided 8 mm nephrolithiasis in the UPJ  Disposition:  The patient was discharged home with instructions to be on the look out for a call from Dr. Quentin Brunner office to schedule follow-up.  Take the oxycodone, ibuprofen, Flomax, and Zofran as prescribed.  Strain urine and collect stone if it does pass. Return precautions given.   This chart was dictated using voice recognition software, Dragon. Despite the best efforts of this provider to proofread and correct errors, errors may still occur which can change documentation meaning.          Final Clinical Impression(s) / ED Diagnoses Final diagnoses:  Nephrolithiasis    Rx / DC Orders ED Discharge Orders          Ordered    tamsulosin (FLOMAX) 0.4 MG CAPS capsule  Daily after breakfast         08/17/23 1702    oxyCODONE (ROXICODONE) 5 MG immediate release tablet  Every 4 hours PRN        08/17/23 1702    ondansetron (ZOFRAN-ODT) 4 MG disintegrating tablet  Every 8 hours PRN        08/17/23 1702    ibuprofen (ADVIL) 800 MG tablet  3 times daily        08/17/23 1702              Rexie Catena, PA-C 08/17/23 1707    Teddi Favors, DO 08/22/23 [phone]

## 2023-08-19 ENCOUNTER — Ambulatory Visit (HOSPITAL_BASED_OUTPATIENT_CLINIC_OR_DEPARTMENT_OTHER)
Admission: RE | Admit: 2023-08-19 | Discharge: 2023-08-19 | Disposition: A | Discharge status: Home / Self Care | Source: Ambulatory Visit | Attending: Urology | Admitting: Urology

## 2023-08-19 ENCOUNTER — Encounter: Payer: Self-pay | Admitting: Urology

## 2023-08-19 ENCOUNTER — Ambulatory Visit (INDEPENDENT_AMBULATORY_CARE_PROVIDER_SITE_OTHER): Admitting: Urology

## 2023-08-19 VITALS — BP 115/80 | HR 67 | Ht 67.0 in | Wt 290.0 lb

## 2023-08-19 DIAGNOSIS — N201 Calculus of ureter: Secondary | ICD-10-CM

## 2023-08-19 DIAGNOSIS — N200 Calculus of kidney: Secondary | ICD-10-CM | POA: Diagnosis present

## 2023-08-19 LAB — MICROSCOPIC EXAMINATION

## 2023-08-19 LAB — URINALYSIS, ROUTINE W REFLEX MICROSCOPIC
Bilirubin, UA: NEGATIVE
Glucose, UA: NEGATIVE
Nitrite, UA: NEGATIVE
Specific Gravity, UA: 1.02 (ref 1.005–1.030)
Urobilinogen, Ur: 1 mg/dL (ref 0.2–1.0)
pH, UA: 5.5 (ref 5.0–7.5)

## 2023-08-19 MED ORDER — OXYCODONE HCL 5 MG PO TABS: 5.0000 mg | ORAL_TABLET | ORAL | 0 refills | Status: AC | PRN

## 2023-08-19 MED ORDER — TAMSULOSIN HCL 0.4 MG PO CAPS: 0.4000 mg | ORAL_CAPSULE | Freq: Every day | ORAL | 0 refills | Status: AC

## 2023-08-19 NOTE — Progress Notes (Signed)
 Assessment: 1. Nephrolithiasis - left UPJ calculus, 8 mm     Plan: I personally reviewed the patient's chart including provider notes, lab and imaging results. I personally reviewed the CT study from 08/17/2023 with results as noted below. KUB from today reviewed and shows a 4 x 8 mm calcification to the left of L3 transverse process.  Option for management of the proximal left ureteral calculus discussed with the patient today including spontaneous passage, shockwave lithotripsy, and ureteroscopic laser lithotripsy. She is interested in proceeding with shockwave lithotripsy.  Unfortunately, she took [facility] yesterday and any treatment requiring anesthesia would need to be delayed for 1 week. She is actually going out of town next week and will not return until 08/27/2023. We will make arrangements for lithotripsy on 08/30/2023. Continue to strain urine. Continue tamsulosin and pain medicine as needed.  Refills provided.  Procedure: The patient will be scheduled for left ESL at [facility].  Surgical request is placed with the surgery schedulers and will be scheduled at the patient's/family request. Informed consent is given as documented below. Anesthesia:  local  The patient does not have sleep apnea, history of MRSA, history of VRE, history of cardiac device requiring special anesthetic needs. Patient is stable and considered clear for surgical management in an outpatient ambulatory surgery setting as well as inpatient hospital setting.  Consent for Operation or Procedure: Provider Certification I hereby certify that the nature, purpose, benefits, usual and most frequent risks of, and alternatives to, the operation or procedure have been explained to the patient (or person authorized to sign for the patient) either by me as responsible physician or by the provider who is to perform the operation or procedure. Time spent such that the patient/family has had an opportunity to ask questions,  and that those questions have been answered. The patient or the patient's representative has been advised that selected tasks may be performed by assistants to the primary health care provider(s). I believe that the patient (or person authorized to sign for the patient) understands what has been explained, and has consented to the operation or procedure. No guarantees were implied or made.   Chief Complaint:  Chief Complaint  Patient presents with   Nephrolithiasis    History of Present Illness:  Joan Becker is a 38 y.o. female who is seen for evaluation of left UPJ calculus. She presented to the emergency room on 08/17/23 with left-sided flank and abdominal pain.  She had associated nausea and vomiting as well.  No fevers or chills. CT imaging showed an 8 mm calculus at the left UPJ with mild left hydronephrosis.  No other stones were seen. Urinalysis showed >50 RBCs.  Creatinine was normal at 0.95. She has continued to have intermittent flank pain requiring pain medication.  She reports that the pain medication does control her symptoms.  Her nausea and vomiting have improved.  She is taking p.o. without difficulty.  No fevers or chills.  No dysuria or gross hematuria.   Past Medical History:  Past Medical History:  Diagnosis Date   Abdominal pain in pregnancy, antepartum 06/06/2014   Abnormal uterine bleeding (AUB)    History of kidney stones    Hypothyroidism    Kidney stones    Obesity    PCOS (polycystic ovarian syndrome)    Postpartum care following vaginal delivery (4/9) 08/19/2014   Sleep apnea 10/19/2022   mild, does not have cpap yet   Thyroid disease     Past Surgical History:  Past Surgical History:  Procedure Laterality Date   ADENOIDECTOMY     yrs ago   ANKLE ARTHROSCOPY Right 03/21/2018   Procedure: RIGHT ANKLE ARTHROSCOPY, OSTEOCHONDRAL DEBRIDEMENT, REMOVAL OF LOOSE BODY;  Surgeon: Eldred Manges, MD;  Location: [facility];  Service:  Orthopedics;  Laterality: Right;   CESAREAN SECTION N/A 01/07/2018   Procedure: CESAREAN SECTION;  Surgeon: Adam Phenix, MD;  Location: [facility];  Service: Obstetrics;  Laterality: N/A;   DILATATION & CURETTAGE/HYSTEROSCOPY WITH MYOSURE N/A 10/28/2022   Procedure: DILATATION & CURETTAGE/HYSTEROSCOPY WITH MYOSURE;  Surgeon: Lorriane Shire, MD;  Location: [facility];  Service: Gynecology;  Laterality: N/A;   DILATION AND CURETTAGE OF UTERUS  2016   DILITATION & CURRETTAGE/HYSTROSCOPY WITH NOVASURE ABLATION N/A 01/18/2023   Procedure: DILATATION & CURETTAGE/HYSTEROSCOPY WITH NOVASURE ABLATION;  Surgeon: Lorriane Shire, MD;  Location: [facility];  Service: Gynecology;  Laterality: N/A;   HYSTEROSCOPY WITH D & C N/A 08/11/2012   Procedure: DILATATION AND CURETTAGE /HYSTEROSCOPY;  Surgeon: Adam Phenix, MD;  Location: [facility];  Service: Gynecology;  Laterality: N/A;   MYRINGOTOMY     as child   ORIF TIBIA PLATEAU Right 10/26/2017   Procedure: OPEN REDUCTION INTERNAL FIXATION (ORIF) TIBIAL PLATEAU, MEDIAL AND LATERAL CONDYLE FIXATION, RIGHT ANKLE SPLINT APPLICATION;  Surgeon: Eldred Manges, MD;  Location: [facility];  Service: Orthopedics;  Laterality: Right;   right ankle surgery  2021   TONSILLECTOMY     as child   TYMPANOSTOMY TUBE PLACEMENT     WISDOM TOOTH EXTRACTION      Allergies:  No Known Allergies  Family History:  Family History  Problem Relation Age of Onset   Diabetes Father    Hypertension Father    Cancer Neg Hx     Social History:  Social History   Tobacco Use   Smoking status: Never   Smokeless tobacco: Never  Vaping Use   Vaping status: Never Used  Substance Use Topics   Alcohol use: Not Currently   Drug use: Never    Review of symptoms:  Constitutional:  Negative for unexplained weight loss, night sweats, fever, chills ENT:  Negative for nose bleeds, sinus pain, painful swallowing CV:  Negative for chest pain,  shortness of breath, exercise intolerance, palpitations, loss of consciousness Resp:  Negative for cough, wheezing, shortness of breath GI:  Negative for diarrhea, bloody stools GU:  Positives noted in HPI; otherwise negative for gross hematuria, dysuria, urinary incontinence Neuro:  Negative for seizures, poor balance, limb weakness, slurred speech Psych:  Negative for lack of energy, depression, anxiety Endocrine:  Negative for polydipsia, polyuria, symptoms of hypoglycemia (dizziness, hunger, sweating) Hematologic:  Negative for anemia, purpura, petechia, prolonged or excessive bleeding, use of anticoagulants  Allergic:  Negative for difficulty breathing or choking as a result of exposure to anything; no shellfish allergy; no allergic response (rash/itch) to materials, foods  Physical exam: BP 115/80   Pulse 67   Ht 5\' 7"  (1.702 m)   Wt 290 lb (131.5 kg)   LMP 08/04/2023   BMI 45.42 kg/m  GENERAL APPEARANCE:  Well appearing, well developed, well nourished, NAD HEENT: Atraumatic, Normocephalic, oropharynx clear. NECK: Supple without lymphadenopathy or thyromegaly. LUNGS: Clear to auscultation bilaterally. HEART: Regular Rate and Rhythm without murmurs, gallops, or rubs. ABDOMEN: Soft, non-tender, No Masses. EXTREMITIES: Moves all extremities well.  Without clubbing, cyanosis, or edema. NEUROLOGIC:  Alert and oriented x 3, normal gait, CN II-XII grossly intact.  MENTAL STATUS:  Appropriate. BACK:  Non-tender to palpation.  No CVAT SKIN:  Warm, dry and intact.    Results: U/A: 0-5 WBCs, 3-10 RBCs

## 2023-08-23 ENCOUNTER — Other Ambulatory Visit: Payer: Self-pay | Admitting: Urology

## 2023-08-23 ENCOUNTER — Ambulatory Visit: Payer: Self-pay | Admitting: Urology

## 2023-08-23 DIAGNOSIS — N200 Calculus of kidney: Secondary | ICD-10-CM

## 2023-08-24 ENCOUNTER — Other Ambulatory Visit: Payer: Self-pay | Admitting: Urology

## 2023-08-24 ENCOUNTER — Encounter (HOSPITAL_COMMUNITY): Payer: Self-pay | Admitting: Urology

## 2023-08-25 ENCOUNTER — Encounter: Payer: Self-pay | Admitting: Urology

## 2023-08-26 ENCOUNTER — Encounter (HOSPITAL_COMMUNITY): Payer: Self-pay | Admitting: Urology

## 2023-08-26 NOTE — Progress Notes (Signed)
 Spoke w/ via phone for pre-op interview---Patient Lab needs dos---- KUB        Lab results------ COVID test -not needed -patient states asymptomatic no test needed Arrive at -------1030 NPO after MN NO Solid Food.  Clear liquids from MN until---0600 Pre-Surgery Ensure or G2:  Med rec completed Medications to take morning of surgery -----Synthroid, Zofran, Flomax, Oxy Diabetic medication -----  GLP1 agonist last dose: 08-18-2023 GLP1 instructions: [id number]   Patient instructed no nail polish to be worn day of surgery Patient instructed to bring photo id and insurance card day of surgery Patient aware to have Driver (ride ) / caregiver    for 24 hours after surgery - spouse- Joan Becker [phone] Patient Special Instructions ----- Pre-Op special Instructions -----per [facility]  Patient verbalized understanding of instructions that were given at this phone interview. Patient denies chest pain, sob, fever, cough at the interview.

## 2023-08-26 NOTE — Progress Notes (Signed)
 Spoke w/ via phone for pre-op interview---left partial message at [phone] Lab needs dos----KUB, UPT              COVID test -----patient states asymptomatic no test needed Arrive at -------0800 NPO after MN NO Solid Food.  Clear liquids from MN until--- Med rec completed. Pt aware to hold ASA/NSAIDs and supplements per [facility] protocol.  Medications to take morning of surgery ----- Diabetic/Weight loss medication ----- No Alcohol or recreational drugs for 24 hours/Tobacco products for 6 hours ---- Patient instructed to bring blue lithotripsy folder, photo id and insurance card day of surgery. Patient aware to have Driver (ride ) / caregiver for 24 hours after surgery ----- Patient Special Instructions ----- Pre-Op special Instructions ----- take laxative of choice day before procedure Patient verbalized understanding of instructions that were given at this phone interview. Patient denies shortness of breath, chest pain, fever, cough at this phone interview.

## 2023-08-30 ENCOUNTER — Ambulatory Visit (HOSPITAL_COMMUNITY)
Admission: RE | Admit: 2023-08-30 | Discharge: 2023-08-30 | Disposition: A | Discharge status: Home / Self Care | Attending: Urology | Admitting: Urology

## 2023-08-30 ENCOUNTER — Other Ambulatory Visit: Payer: Self-pay

## 2023-08-30 ENCOUNTER — Ambulatory Visit (HOSPITAL_COMMUNITY)

## 2023-08-30 ENCOUNTER — Encounter (HOSPITAL_COMMUNITY)
Admission: RE | Disposition: A | Discharge status: Home / Self Care | Payer: Self-pay | Source: Home / Self Care | Attending: Urology

## 2023-08-30 ENCOUNTER — Ambulatory Visit: Admit: 2023-08-30 | Admitting: Urology

## 2023-08-30 ENCOUNTER — Encounter (HOSPITAL_COMMUNITY): Payer: Self-pay | Admitting: Urology

## 2023-08-30 DIAGNOSIS — Z6841 Body Mass Index (BMI) 40.0 and over, adult: Secondary | ICD-10-CM | POA: Insufficient documentation

## 2023-08-30 DIAGNOSIS — N200 Calculus of kidney: Secondary | ICD-10-CM

## 2023-08-30 DIAGNOSIS — N201 Calculus of ureter: Secondary | ICD-10-CM | POA: Diagnosis present

## 2023-08-30 DIAGNOSIS — G4730 Sleep apnea, unspecified: Secondary | ICD-10-CM | POA: Diagnosis not present

## 2023-08-30 DIAGNOSIS — E669 Obesity, unspecified: Secondary | ICD-10-CM | POA: Diagnosis not present

## 2023-08-30 HISTORY — PX: EXTRACORPOREAL SHOCK WAVE LITHOTRIPSY: SHX1557

## 2023-08-30 LAB — POCT PREGNANCY, URINE: Preg Test, Ur: NEGATIVE

## 2023-08-30 SURGERY — LITHOTRIPSY, ESWL
Anesthesia: LOCAL | Laterality: Left

## 2023-08-30 MED ORDER — TRAMADOL HCL 50 MG PO TABS: 50.0000 mg | ORAL_TABLET | Freq: Four times a day (QID) | ORAL | 0 refills | Status: AC | PRN

## 2023-08-30 MED ORDER — SODIUM CHLORIDE 0.9 % IV SOLN
INTRAVENOUS | Status: DC
  Administered 2023-08-30: 1000 mL via INTRAVENOUS

## 2023-08-30 MED ORDER — DIAZEPAM 5 MG PO TABS
10.0000 mg | ORAL_TABLET | ORAL | Status: AC
  Administered 2023-08-30: 10 mg via ORAL
  Filled 2023-08-30: qty 2

## 2023-08-30 MED ORDER — DIPHENHYDRAMINE HCL 25 MG PO CAPS
25.0000 mg | ORAL_CAPSULE | ORAL | Status: AC
  Administered 2023-08-30: 25 mg via ORAL
  Filled 2023-08-30: qty 1

## 2023-08-30 MED ORDER — CIPROFLOXACIN HCL 500 MG PO TABS
500.0000 mg | ORAL_TABLET | ORAL | Status: AC
  Administered 2023-08-30: 500 mg via ORAL
  Filled 2023-08-30: qty 1

## 2023-08-30 NOTE — Discharge Instructions (Signed)
1. You should strain your urine and collect all fragments and bring them to your follow up appointment.  °2. You should take your pain medication as needed.  Please call if your pain is severe to the point that it is not controlled with your pain medication. °3. You should call if you develop fever > 101 or persistent nausea or vomiting. °4. Your doctor may prescribe tamsulosin to take to help facilitate stone passage. °

## 2023-08-30 NOTE — Op Note (Signed)
 See [employer] operative note scanned into chart. Also because of the size, density, location and other factors that cannot be anticipated I feel this will likely be a staged procedure. This fact supersedes any indication in the scanned [place] stone operative note to the contrary.  Julene Oaks MD 08/30/2023, 1:05 PM  [facility]  Pager: [phone]

## 2023-08-30 NOTE — H&P (Signed)
 H&P  History of Present Illness: Joan Becker is a 38 y.o. year old F who presents today for treatment of a left ureteral stone  No acute complaints  Past Medical History:  Diagnosis Date   Abdominal pain in pregnancy, antepartum 06/06/2014   Abnormal uterine bleeding (AUB)    History of kidney stones    Hypothyroidism    Obesity    PCOS (polycystic ovarian syndrome)    Postpartum care following vaginal delivery (4/9) 08/19/2014   Sleep apnea 10/19/2022   mild, does not have cpap yet   Thyroid  disease     Past Surgical History:  Procedure Laterality Date   ADENOIDECTOMY     yrs ago   ANKLE ARTHROSCOPY Right 03/21/2018   Procedure: RIGHT ANKLE ARTHROSCOPY, OSTEOCHONDRAL DEBRIDEMENT, REMOVAL OF LOOSE BODY;  Surgeon: Adah Acron, MD;  Location: [facility];  Service: Orthopedics;  Laterality: Right;   CESAREAN SECTION N/A 01/07/2018   Procedure: CESAREAN SECTION;  Surgeon: Tresia Fruit, MD;  Location: [facility];  Service: Obstetrics;  Laterality: N/A;   DILATATION & CURETTAGE/HYSTEROSCOPY WITH MYOSURE N/A 10/28/2022   Procedure: DILATATION & CURETTAGE/HYSTEROSCOPY WITH MYOSURE;  Surgeon: Kiki Pelton, MD;  Location: [facility];  Service: Gynecology;  Laterality: N/A;   DILATION AND CURETTAGE OF UTERUS  2016   DILITATION & CURRETTAGE/HYSTROSCOPY WITH NOVASURE ABLATION N/A 01/18/2023   Procedure: DILATATION & CURETTAGE/HYSTEROSCOPY WITH NOVASURE ABLATION;  Surgeon: Kiki Pelton, MD;  Location: [facility];  Service: Gynecology;  Laterality: N/A;   HYSTEROSCOPY WITH D & C N/A 08/11/2012   Procedure: DILATATION AND CURETTAGE /HYSTEROSCOPY;  Surgeon: Tresia Fruit, MD;  Location: [facility];  Service: Gynecology;  Laterality: N/A;   MYRINGOTOMY     as child   ORIF TIBIA PLATEAU Right 10/26/2017   Procedure: OPEN REDUCTION INTERNAL FIXATION (ORIF) TIBIAL PLATEAU, MEDIAL AND LATERAL CONDYLE FIXATION, RIGHT ANKLE SPLINT  APPLICATION;  Surgeon: Adah Acron, MD;  Location: [facility];  Service: Orthopedics;  Laterality: Right;   right ankle surgery  2021   TONSILLECTOMY     as child   TYMPANOSTOMY TUBE PLACEMENT     WISDOM TOOTH EXTRACTION      Home Medications:  Current Meds  Medication Sig   amphetamine-dextroamphetamine (ADDERALL) 30 MG tablet 30 mg 2 (two) times daily.   ibuprofen  (ADVIL ) 800 MG tablet Take 1 tablet (800 mg total) by mouth 3 (three) times daily.   levothyroxine  (SYNTHROID ) 200 MCG tablet Take 1 tablet (200 mcg total) by mouth daily before breakfast.   meloxicam  (MOBIC ) 15 MG tablet Take 1 tablet (15 mg total) by mouth daily.   ondansetron  (ZOFRAN -ODT) 4 MG disintegrating tablet Take 1 tablet (4 mg total) by mouth every 8 (eight) hours as needed for nausea or vomiting.   oxycodone  (OXY-IR) 5 MG capsule Take 5 mg by mouth every 5 (five) hours as needed for pain. "Sometimes I take one and sometimes I take two"   tamsulosin  (FLOMAX ) 0.4 MG CAPS capsule Take 1 capsule (0.4 mg total) by mouth daily after breakfast. Stop taking after you pass your kidney stone    Allergies: No Known Allergies  Family History  Problem Relation Age of Onset   Diabetes Father    Hypertension Father    Cancer Neg Hx     Social History:  reports that she has never smoked. She has never used smokeless tobacco. She reports that she does not currently use alcohol. She reports that she does  not use drugs.  ROS: A complete review of systems was performed.  All systems are negative except for pertinent findings as noted.  Physical Exam:  Vital signs in last 24 hours:   Constitutional:  Alert and oriented, No acute distress Cardiovascular: Regular rate and rhythm Respiratory: Normal respiratory effort, Lungs clear bilaterally GI: Abdomen is soft, nontender, nondistended, no abdominal masses Lymphatic: No lymphadenopathy Neurologic: Grossly intact, no focal deficits Psychiatric: Normal mood and  affect   Laboratory Data:  No results for input(s): "WBC", "HGB", "HCT", "PLT" in the last 72 hours.  No results for input(s): "NA", "K", "CL", "GLUCOSE", "BUN", "CALCIUM", "CREATININE" in the last 72 hours.  Invalid input(s): "CO3"   No results found for this or any previous visit (from the past 24 hours). No results found for this or any previous visit (from the past 240 hours).  Renal Function: No results for input(s): "CREATININE" in the last 168 hours. Estimated Creatinine Clearance: 113.6 mL/min (by C-G formula based on SCr of 0.95 mg/dL).  Radiologic Imaging: No results found.  Assessment:  Joan Becker is a 38 y.o. year old F with left ureteral stone   Plan:  --to OR as planned for L ESWL. Procedure and risks reviewed, including but not limited to hematuria, infection, sepsis, damage to GU tract, failure to complete procedure, retained stone fragments, need for future procedures, steinstrasse, pain, failure to pass fragments  Julene Oaks, MD 08/30/2023, 10:55 AM  [facility] Pager: [phone]

## 2023-09-03 ENCOUNTER — Other Ambulatory Visit: Payer: Self-pay | Admitting: Urology

## 2023-09-03 MED ORDER — SULFAMETHOXAZOLE-TRIMETHOPRIM 800-160 MG PO TABS: 1.0000 | ORAL_TABLET | Freq: Two times a day (BID) | ORAL | 0 refills | Status: AC

## 2023-09-06 ENCOUNTER — Other Ambulatory Visit: Payer: Self-pay | Admitting: Urology

## 2023-09-06 DIAGNOSIS — N200 Calculus of kidney: Secondary | ICD-10-CM

## 2023-09-14 ENCOUNTER — Ambulatory Visit: Admitting: Podiatry

## 2023-10-06 ENCOUNTER — Other Ambulatory Visit: Payer: Self-pay | Admitting: Obstetrics & Gynecology

## 2023-10-06 DIAGNOSIS — Z1231 Encounter for screening mammogram for malignant neoplasm of breast: Secondary | ICD-10-CM

## 2023-10-12 ENCOUNTER — Encounter (HOSPITAL_BASED_OUTPATIENT_CLINIC_OR_DEPARTMENT_OTHER): Payer: Self-pay | Admitting: Radiology

## 2023-10-12 ENCOUNTER — Ambulatory Visit (HOSPITAL_BASED_OUTPATIENT_CLINIC_OR_DEPARTMENT_OTHER)
Admission: RE | Admit: 2023-10-12 | Discharge: 2023-10-12 | Disposition: A | Discharge status: Home / Self Care | Source: Ambulatory Visit | Attending: Obstetrics & Gynecology | Admitting: Obstetrics & Gynecology

## 2023-10-12 DIAGNOSIS — Z1231 Encounter for screening mammogram for malignant neoplasm of breast: Secondary | ICD-10-CM | POA: Diagnosis present

## 2023-11-26 ENCOUNTER — Encounter: Payer: Self-pay | Admitting: Advanced Practice Midwife
